# Patient Record
Sex: Male | Born: 1945
Health system: Southern US, Community
[De-identification: ages and names within clinical notes are randomized; demographics above are authoritative.]

## PROBLEM LIST (undated history)

## (undated) DIAGNOSIS — M109 Gout, unspecified: Secondary | ICD-10-CM

## (undated) DIAGNOSIS — I252 Old myocardial infarction: Secondary | ICD-10-CM

## (undated) DIAGNOSIS — Z955 Presence of coronary angioplasty implant and graft: Secondary | ICD-10-CM

## (undated) DIAGNOSIS — E785 Hyperlipidemia, unspecified: Secondary | ICD-10-CM

## (undated) DIAGNOSIS — M51369 Other intervertebral disc degeneration, lumbar region without mention of lumbar back pain or lower extremity pain: Secondary | ICD-10-CM

## (undated) DIAGNOSIS — R112 Nausea with vomiting, unspecified: Secondary | ICD-10-CM

## (undated) DIAGNOSIS — Z87442 Personal history of urinary calculi: Secondary | ICD-10-CM

## (undated) DIAGNOSIS — Z923 Personal history of irradiation: Secondary | ICD-10-CM

## (undated) DIAGNOSIS — M1990 Unspecified osteoarthritis, unspecified site: Secondary | ICD-10-CM

## (undated) DIAGNOSIS — Z9889 Other specified postprocedural states: Secondary | ICD-10-CM

## (undated) DIAGNOSIS — N393 Stress incontinence (female) (male): Secondary | ICD-10-CM

## (undated) DIAGNOSIS — N261 Atrophy of kidney (terminal): Secondary | ICD-10-CM

## (undated) DIAGNOSIS — I10 Essential (primary) hypertension: Secondary | ICD-10-CM

## (undated) DIAGNOSIS — N529 Male erectile dysfunction, unspecified: Secondary | ICD-10-CM

## (undated) DIAGNOSIS — Z77098 Contact with and (suspected) exposure to other hazardous, chiefly nonmedicinal, chemicals: Secondary | ICD-10-CM

## (undated) DIAGNOSIS — K5730 Diverticulosis of large intestine without perforation or abscess without bleeding: Secondary | ICD-10-CM

## (undated) DIAGNOSIS — N183 Chronic kidney disease, stage 3 unspecified: Secondary | ICD-10-CM

## (undated) DIAGNOSIS — L719 Rosacea, unspecified: Secondary | ICD-10-CM

## (undated) DIAGNOSIS — M5136 Other intervertebral disc degeneration, lumbar region: Secondary | ICD-10-CM

## (undated) DIAGNOSIS — N200 Calculus of kidney: Secondary | ICD-10-CM

## (undated) DIAGNOSIS — Z87440 Personal history of urinary (tract) infections: Secondary | ICD-10-CM

## (undated) DIAGNOSIS — C61 Malignant neoplasm of prostate: Secondary | ICD-10-CM

## (undated) DIAGNOSIS — N3040 Irradiation cystitis without hematuria: Secondary | ICD-10-CM

## (undated) DIAGNOSIS — Z973 Presence of spectacles and contact lenses: Secondary | ICD-10-CM

## (undated) DIAGNOSIS — R351 Nocturia: Secondary | ICD-10-CM

## (undated) DIAGNOSIS — M4800 Spinal stenosis, site unspecified: Secondary | ICD-10-CM

## (undated) DIAGNOSIS — IMO0001 Reserved for inherently not codable concepts without codable children: Secondary | ICD-10-CM

## (undated) DIAGNOSIS — Z8551 Personal history of malignant neoplasm of bladder: Secondary | ICD-10-CM

## (undated) DIAGNOSIS — I2510 Atherosclerotic heart disease of native coronary artery without angina pectoris: Secondary | ICD-10-CM

## (undated) HISTORY — PX: CARDIOVASCULAR STRESS TEST: SHX262

## (undated) HISTORY — DX: Atherosclerotic heart disease of native coronary artery without angina pectoris: I25.10

## (undated) HISTORY — PX: CORONARY ANGIOPLASTY WITH STENT PLACEMENT: SHX49

## (undated) HISTORY — DX: Gout, unspecified: M10.9

## (undated) HISTORY — DX: Personal history of irradiation: Z92.3

## (undated) HISTORY — PX: OTHER SURGICAL HISTORY: SHX169

## (undated) HISTORY — PX: CORONARY ANGIOPLASTY: SHX604

---

## 1993-11-25 HISTORY — PX: LUMBAR DISC SURGERY: SHX700

## 1998-09-20 ENCOUNTER — Ambulatory Visit (HOSPITAL_COMMUNITY): Admission: RE | Admit: 1998-09-20 | Discharge: 1998-09-20 | Payer: Self-pay | Admitting: Internal Medicine

## 1998-09-21 ENCOUNTER — Ambulatory Visit (HOSPITAL_COMMUNITY): Admission: RE | Admit: 1998-09-21 | Discharge: 1998-09-21 | Payer: Self-pay | Admitting: Internal Medicine

## 1998-09-21 ENCOUNTER — Encounter: Payer: Self-pay | Admitting: Internal Medicine

## 1998-11-02 ENCOUNTER — Observation Stay (HOSPITAL_COMMUNITY): Admission: RE | Admit: 1998-11-02 | Discharge: 1998-11-03 | Payer: Self-pay | Admitting: Neurosurgery

## 1998-11-02 ENCOUNTER — Encounter: Payer: Self-pay | Admitting: Neurosurgery

## 1998-12-20 ENCOUNTER — Encounter: Payer: Self-pay | Admitting: Neurosurgery

## 1998-12-20 ENCOUNTER — Ambulatory Visit (HOSPITAL_COMMUNITY): Admission: RE | Admit: 1998-12-20 | Discharge: 1998-12-20 | Payer: Self-pay | Admitting: Neurosurgery

## 1999-05-17 ENCOUNTER — Encounter: Payer: Self-pay | Admitting: Urology

## 1999-05-17 ENCOUNTER — Ambulatory Visit (HOSPITAL_COMMUNITY): Admission: RE | Admit: 1999-05-17 | Discharge: 1999-05-17 | Payer: Self-pay | Admitting: Urology

## 1999-11-26 HISTORY — PX: ANTERIOR CERVICAL DECOMP/DISCECTOMY FUSION: SHX1161

## 2000-07-02 ENCOUNTER — Other Ambulatory Visit: Admission: RE | Admit: 2000-07-02 | Discharge: 2000-07-02 | Payer: Self-pay | Admitting: Urology

## 2000-08-05 ENCOUNTER — Encounter: Payer: Self-pay | Admitting: Urology

## 2000-08-07 ENCOUNTER — Encounter: Payer: Self-pay | Admitting: Urology

## 2000-08-07 ENCOUNTER — Encounter (INDEPENDENT_AMBULATORY_CARE_PROVIDER_SITE_OTHER): Payer: Self-pay

## 2000-08-07 ENCOUNTER — Inpatient Hospital Stay (HOSPITAL_COMMUNITY): Admission: RE | Admit: 2000-08-07 | Discharge: 2000-08-10 | Payer: Self-pay | Admitting: Urology

## 2000-08-07 HISTORY — PX: OTHER SURGICAL HISTORY: SHX169

## 2001-11-25 HISTORY — PX: PENILE PROSTHESIS PLACEMENT: SHX739

## 2002-03-11 ENCOUNTER — Encounter: Payer: Self-pay | Admitting: Internal Medicine

## 2002-03-11 ENCOUNTER — Encounter: Admission: RE | Admit: 2002-03-11 | Discharge: 2002-03-11 | Payer: Self-pay | Admitting: Internal Medicine

## 2002-04-22 ENCOUNTER — Encounter: Admission: RE | Admit: 2002-04-22 | Discharge: 2002-04-22 | Payer: Self-pay | Admitting: Neurosurgery

## 2002-04-22 ENCOUNTER — Encounter: Payer: Self-pay | Admitting: Neurosurgery

## 2002-10-04 ENCOUNTER — Encounter: Payer: Self-pay | Admitting: Internal Medicine

## 2002-10-04 ENCOUNTER — Encounter: Admission: RE | Admit: 2002-10-04 | Discharge: 2002-10-04 | Payer: Self-pay | Admitting: Internal Medicine

## 2003-04-29 ENCOUNTER — Inpatient Hospital Stay (HOSPITAL_COMMUNITY): Admission: EM | Admit: 2003-04-29 | Discharge: 2003-05-02 | Payer: Self-pay

## 2003-05-16 ENCOUNTER — Encounter (HOSPITAL_COMMUNITY): Admission: RE | Admit: 2003-05-16 | Discharge: 2003-08-14 | Payer: Self-pay | Admitting: Cardiology

## 2003-11-02 ENCOUNTER — Encounter (INDEPENDENT_AMBULATORY_CARE_PROVIDER_SITE_OTHER): Payer: Self-pay

## 2003-11-02 ENCOUNTER — Ambulatory Visit (HOSPITAL_COMMUNITY): Admission: RE | Admit: 2003-11-02 | Discharge: 2003-11-02 | Payer: Self-pay | Admitting: General Surgery

## 2003-11-03 HISTORY — PX: INGUINAL HERNIA REPAIR: SUR1180

## 2004-07-17 ENCOUNTER — Emergency Department (HOSPITAL_COMMUNITY): Admission: EM | Admit: 2004-07-17 | Discharge: 2004-07-18 | Payer: Self-pay | Admitting: Emergency Medicine

## 2004-07-19 ENCOUNTER — Observation Stay (HOSPITAL_COMMUNITY): Admission: AD | Admit: 2004-07-19 | Discharge: 2004-07-20 | Payer: Self-pay | Admitting: Urology

## 2004-07-19 HISTORY — PX: OTHER SURGICAL HISTORY: SHX169

## 2004-07-26 ENCOUNTER — Ambulatory Visit (HOSPITAL_BASED_OUTPATIENT_CLINIC_OR_DEPARTMENT_OTHER): Admission: RE | Admit: 2004-07-26 | Discharge: 2004-07-26 | Payer: Self-pay | Admitting: Urology

## 2004-07-26 HISTORY — PX: OTHER SURGICAL HISTORY: SHX169

## 2004-11-28 ENCOUNTER — Ambulatory Visit: Payer: Self-pay | Admitting: Internal Medicine

## 2005-01-30 ENCOUNTER — Ambulatory Visit: Payer: Self-pay | Admitting: Gastroenterology

## 2005-05-30 ENCOUNTER — Ambulatory Visit: Payer: Self-pay | Admitting: Cardiology

## 2005-06-20 ENCOUNTER — Ambulatory Visit: Payer: Self-pay | Admitting: Cardiology

## 2005-06-20 ENCOUNTER — Emergency Department (HOSPITAL_COMMUNITY): Admission: EM | Admit: 2005-06-20 | Discharge: 2005-06-20 | Payer: Self-pay | Admitting: Emergency Medicine

## 2005-06-24 ENCOUNTER — Ambulatory Visit: Payer: Self-pay | Admitting: Cardiology

## 2005-07-03 ENCOUNTER — Ambulatory Visit: Payer: Self-pay

## 2005-07-10 ENCOUNTER — Ambulatory Visit: Payer: Self-pay | Admitting: Internal Medicine

## 2005-08-12 ENCOUNTER — Ambulatory Visit: Payer: Self-pay | Admitting: Internal Medicine

## 2006-06-25 ENCOUNTER — Ambulatory Visit: Payer: Self-pay | Admitting: Cardiology

## 2006-08-08 ENCOUNTER — Ambulatory Visit: Payer: Self-pay | Admitting: Internal Medicine

## 2006-09-17 ENCOUNTER — Ambulatory Visit: Payer: Self-pay | Admitting: Internal Medicine

## 2006-10-08 ENCOUNTER — Ambulatory Visit: Payer: Self-pay | Admitting: Cardiology

## 2006-12-11 ENCOUNTER — Ambulatory Visit: Payer: Self-pay | Admitting: Internal Medicine

## 2007-01-01 ENCOUNTER — Ambulatory Visit: Payer: Self-pay | Admitting: Gastroenterology

## 2007-03-04 ENCOUNTER — Ambulatory Visit: Payer: Self-pay | Admitting: Gastroenterology

## 2007-03-04 HISTORY — PX: COLONOSCOPY: SHX174

## 2007-03-04 LAB — HM COLONOSCOPY

## 2007-04-24 ENCOUNTER — Ambulatory Visit: Payer: Self-pay | Admitting: Internal Medicine

## 2007-08-20 ENCOUNTER — Ambulatory Visit: Payer: Self-pay | Admitting: Cardiology

## 2007-08-26 ENCOUNTER — Ambulatory Visit: Payer: Self-pay

## 2008-01-12 ENCOUNTER — Ambulatory Visit: Payer: Self-pay | Admitting: Internal Medicine

## 2008-01-12 DIAGNOSIS — M1990 Unspecified osteoarthritis, unspecified site: Secondary | ICD-10-CM

## 2008-01-12 DIAGNOSIS — M545 Low back pain: Secondary | ICD-10-CM

## 2008-01-12 DIAGNOSIS — I2510 Atherosclerotic heart disease of native coronary artery without angina pectoris: Secondary | ICD-10-CM | POA: Insufficient documentation

## 2008-06-07 ENCOUNTER — Ambulatory Visit: Payer: Self-pay | Admitting: Internal Medicine

## 2008-06-07 DIAGNOSIS — G5600 Carpal tunnel syndrome, unspecified upper limb: Secondary | ICD-10-CM | POA: Insufficient documentation

## 2008-06-07 DIAGNOSIS — R209 Unspecified disturbances of skin sensation: Secondary | ICD-10-CM | POA: Insufficient documentation

## 2008-06-07 DIAGNOSIS — S139XXA Sprain of joints and ligaments of unspecified parts of neck, initial encounter: Secondary | ICD-10-CM

## 2008-06-08 LAB — CONVERTED CEMR LAB
BUN: 18 mg/dL (ref 6–23)
Basophils Relative: 1.4 % — ABNORMAL HIGH (ref 0.0–1.0)
Calcium: 9.7 mg/dL (ref 8.4–10.5)
Creatinine, Ser: 1.2 mg/dL (ref 0.4–1.5)
Folate: >20 ng/mL
GFR calc Af Amer: 79 mL/min
Glucose, Bld: 88 mg/dL (ref 70–99)
Lymphocytes Relative: 33.5 % (ref 12.0–46.0)
Neutro Abs: 3.5 10*3/uL (ref 1.4–7.7)
Neutrophils Relative %: 52.3 % (ref 43.0–77.0)
Sodium: 139 meq/L (ref 135–145)
WBC: 6.8 10*3/uL (ref 4.5–10.5)

## 2008-06-14 ENCOUNTER — Telehealth (INDEPENDENT_AMBULATORY_CARE_PROVIDER_SITE_OTHER): Payer: Self-pay | Admitting: *Deleted

## 2008-06-14 ENCOUNTER — Ambulatory Visit: Payer: Self-pay | Admitting: Internal Medicine

## 2008-06-14 DIAGNOSIS — E785 Hyperlipidemia, unspecified: Secondary | ICD-10-CM | POA: Insufficient documentation

## 2008-06-14 DIAGNOSIS — M109 Gout, unspecified: Secondary | ICD-10-CM

## 2008-06-14 DIAGNOSIS — K5730 Diverticulosis of large intestine without perforation or abscess without bleeding: Secondary | ICD-10-CM | POA: Insufficient documentation

## 2008-06-14 DIAGNOSIS — L03818 Cellulitis of other sites: Secondary | ICD-10-CM

## 2008-06-14 DIAGNOSIS — L02818 Cutaneous abscess of other sites: Secondary | ICD-10-CM

## 2008-06-14 HISTORY — DX: Gout, unspecified: M10.9

## 2008-06-14 LAB — CONVERTED CEMR LAB
Eosinophils Relative: 1.6 % (ref 0.0–5.0)
HCT: 42.9 % (ref 39.0–52.0)
Neutro Abs: 3.3 10*3/uL (ref 1.4–7.7)
Platelets: 124 10*3/uL — ABNORMAL LOW (ref 150–400)
RBC: 4.67 M/uL (ref 4.22–5.81)
RDW: 12.7 % (ref 11.5–14.6)

## 2008-07-29 ENCOUNTER — Ambulatory Visit: Payer: Self-pay | Admitting: Cardiology

## 2008-10-12 ENCOUNTER — Encounter: Payer: Self-pay | Admitting: Internal Medicine

## 2008-10-17 ENCOUNTER — Ambulatory Visit: Payer: Self-pay | Admitting: Internal Medicine

## 2008-10-17 DIAGNOSIS — J209 Acute bronchitis, unspecified: Secondary | ICD-10-CM

## 2009-02-08 ENCOUNTER — Encounter: Payer: Self-pay | Admitting: Physician Assistant

## 2009-02-08 ENCOUNTER — Ambulatory Visit: Payer: Self-pay | Admitting: Cardiology

## 2009-02-15 ENCOUNTER — Telehealth (INDEPENDENT_AMBULATORY_CARE_PROVIDER_SITE_OTHER): Payer: Self-pay | Admitting: *Deleted

## 2009-02-16 ENCOUNTER — Ambulatory Visit: Payer: Self-pay

## 2009-02-16 ENCOUNTER — Encounter: Payer: Self-pay | Admitting: Internal Medicine

## 2009-02-23 ENCOUNTER — Encounter: Payer: Self-pay | Admitting: Internal Medicine

## 2009-03-30 ENCOUNTER — Ambulatory Visit: Payer: Self-pay | Admitting: Cardiology

## 2009-07-05 ENCOUNTER — Ambulatory Visit: Payer: Self-pay | Admitting: Internal Medicine

## 2009-07-05 DIAGNOSIS — J019 Acute sinusitis, unspecified: Secondary | ICD-10-CM

## 2010-01-10 ENCOUNTER — Ambulatory Visit: Payer: Self-pay | Admitting: Internal Medicine

## 2010-01-10 DIAGNOSIS — Z87891 Personal history of nicotine dependence: Secondary | ICD-10-CM | POA: Insufficient documentation

## 2010-01-10 DIAGNOSIS — M79609 Pain in unspecified limb: Secondary | ICD-10-CM | POA: Insufficient documentation

## 2010-01-19 ENCOUNTER — Ambulatory Visit: Payer: Self-pay | Admitting: Internal Medicine

## 2010-01-19 ENCOUNTER — Observation Stay (HOSPITAL_COMMUNITY): Admission: EM | Admit: 2010-01-19 | Discharge: 2010-01-23 | Payer: Self-pay | Admitting: Emergency Medicine

## 2010-01-21 ENCOUNTER — Encounter (INDEPENDENT_AMBULATORY_CARE_PROVIDER_SITE_OTHER): Payer: Self-pay | Admitting: Internal Medicine

## 2010-01-21 HISTORY — PX: TRANSTHORACIC ECHOCARDIOGRAM: SHX275

## 2010-01-24 ENCOUNTER — Encounter: Payer: Self-pay | Admitting: Cardiology

## 2010-02-02 DIAGNOSIS — I10 Essential (primary) hypertension: Secondary | ICD-10-CM | POA: Insufficient documentation

## 2010-02-02 DIAGNOSIS — N189 Chronic kidney disease, unspecified: Secondary | ICD-10-CM | POA: Insufficient documentation

## 2010-02-06 ENCOUNTER — Ambulatory Visit: Payer: Self-pay | Admitting: Cardiology

## 2010-02-20 ENCOUNTER — Encounter: Payer: Self-pay | Admitting: Internal Medicine

## 2010-03-07 ENCOUNTER — Encounter: Payer: Self-pay | Admitting: Internal Medicine

## 2010-03-12 ENCOUNTER — Encounter: Payer: Self-pay | Admitting: Cardiology

## 2010-03-15 ENCOUNTER — Ambulatory Visit: Payer: Self-pay | Admitting: Internal Medicine

## 2010-03-15 DIAGNOSIS — R040 Epistaxis: Secondary | ICD-10-CM | POA: Insufficient documentation

## 2010-04-06 ENCOUNTER — Ambulatory Visit: Payer: Self-pay | Admitting: Internal Medicine

## 2010-09-19 ENCOUNTER — Encounter: Payer: Self-pay | Admitting: Cardiology

## 2010-11-25 HISTORY — PX: BONE CYST EXCISION: SHX376

## 2010-12-27 NOTE — Miscellaneous (Signed)
Summary: pravastatin 80 called into [facility] today  Clinical Lists Changes  Medications: Changed medication from CRESTOR 40 MG TABS (ROSUVASTATIN CALCIUM) 1/2 tablet by mouth daily to PRAVASTATIN SODIUM 80 MG TABS (PRAVASTATIN SODIUM) 1 tab at bedtime - Signed Rx of PRAVASTATIN SODIUM 80 MG TABS (PRAVASTATIN SODIUM) 1 tab at bedtime;  #30 x 11;  Signed;  Entered by: Danielle Rankin, CMA;  Authorized by: Gaylord Shih, MD, [facility];  Method used: Telephoned to [id number] [address], [place], [place]  [address], Ph: [phone] or [phone], Fax: [phone]    Prescriptions: PRAVASTATIN SODIUM 80 MG TABS (PRAVASTATIN SODIUM) 1 tab at bedtime  #30 x 11   Entered by:   Danielle Rankin, CMA   Authorized by:   Gaylord Shih, MD, [facility]   Signed by:   Danielle Rankin, CMA on 09/19/2010   Method used:   Telephoned to ...       [facility] (retail)       [address]       [place], [place]  [address]       Ph: [phone] or [phone]       Fax: [phone]   RxID:   [id number]

## 2010-12-27 NOTE — Assessment & Plan Note (Signed)
Summary: 2 mos f/u // #/ cd   Vital Signs:  Patient profile:   65 year old male Height:      71 inches Weight:      215.25 pounds BMI:     30.13 O2 Sat:      95 % on Room air Temp:     98.1 degrees F oral Pulse rate:   74 / minute BP sitting:   100 / 62  (left arm) Cuff size:   regular  Vitals Entered By: Lucious Groves (March 15, 2010 11:09 AM)  O2 Flow:  Room air CC: 2 mo rtn ov/followup./kb Is Patient Diabetic? No Pain Assessment Patient in pain? no        Primary Care Provider:  Tresa Garter MD  CC:  2 mo rtn ov/followup./kb.  History of Present Illness: F/u LBP F/u CAD  C/o nasal bleed last night x 30 min  Current Medications (verified): 1)  Allopurinol 300 Mg Tabs (Allopurinol) .... Take 1 Tab Every Day 2)  Crestor 40 Mg Tabs (Rosuvastatin Calcium) .... 1/2 Tablet By Mouth Daily 3)  Metoprolol Tartrate 25 Mg Tabs (Metoprolol Tartrate) .[name] Kitchen.. 1 Tab Two Times A Day 4)  Zetia 10 Mg Tabs (Ezetimibe) .[name] Kitchen.. 1 By Mouth Qd 5)  Fish Oil 1000 Mg Caps (Omega-3 Fatty Acids) .[name] Kitchen.. 1 Cap Once Daily 6)  Vitamin D3 1000 Unit  Tabs (Cholecalciferol) .[name] Kitchen.. 1 Qd 7)  Aspirin Ec 325 Mg Tbec (Aspirin) .... Take One Tablet By Mouth Daily 8)  Centrum  Tabs (Multiple Vitamins-Minerals) .[name] Kitchen.. 1 By Mouth Once Daily 9)  Aceon 8 Mg Tabs (Perindopril Erbumine) .[name] Kitchen.. 1 By Mouth Once Daily 10)  Aleve 220 Mg Tabs (Naproxen Sodium) .... As Needed 11)  Hydrocodone-Acetaminophen 5-325 Mg Tabs (Hydrocodone-Acetaminophen) .[name] Kitchen.. 1-2 By Mouth Two Times A Day As Needed Pain 12)  Plavix 75 Mg Tabs (Clopidogrel Bisulfate) .[name] Kitchen.. 1 Tab Once Daily 13)  Nitrostat 0.4 Mg Subl (Nitroglycerin) .[name] Kitchen.. 1 Tablet Under Tongue At Onset of Chest Pain; You May Repeat Every 5 Minutes For Up To 3 Doses. 14)  Fish Oil 1000 Mg Caps (Omega-3 Fatty Acids) .[name] Kitchen.. 1 Cap Once Daily  Allergies (verified): No Known Drug Allergies  Past History:  Past Medical History: Last updated: 02/02/2010 OLD MYOCARDIAL INFARCTION/ 04/2003  (ICD-412) CORONARY ARTERY DISEASE (ICD-414.00) HYPERTENSION (ICD-401.9) HYPERLIPIDEMIA (ICD-272.4) RENAL INSUFFICIENCY, CHRONIC (ICD-585.9) LEG PAIN (ICD-729.5) TOBACCO USE, QUIT (ICD-V15.82) BRONCHITIS, ACUTE (ICD-466.0) DIVERTICULOSIS, COLON (ICD-562.10) GOUT (ICD-274.9) CELLULITIS AND ABSCESS OF OTHER SPECIFIED SITE [phone]) CARPAL TUNNEL SYNDROME (ICD-354.0) PARESTHESIA (ICD-782.0) CERVICAL STRAIN (ICD-847.0) OSTEOARTHRITIS (ICD-715.90) LOW BACK PAIN (ICD-724.2) PROSTATE CANCER, HX OF (ICD-V10.46) SINUSITIS- ACUTE-NOS (ICD-461.9)    Social History: Last updated: 03/25/2009 Married Former Smoker Full Time  Review of Systems  The patient denies fever, chest pain, and hematochezia.    Physical Exam  General:  alert and overweight-appearing.  Eyes:  vision grossly intact, pupils equal, and pupils round.   Ears:  WNL Mouth:  no gingival abnormalities and pharynx pink and moist.   Lungs:  normal respiratory effort and normal breath sounds.   Heart:  normal rate and regular rhythm.   Abdomen:  Bowel sounds positive,abdomen soft and non-tender without masses, organomegaly or hernias noted. Msk:  Lumbar-sacral spine is not tender to palpation over paraspinal muscles and not painfull with the ROM, less  stiff Neurologic:  str leg elev (-) B Skin:  Intact without suspicious lesions or rashes   Impression & Recommendations:  Problem # 1:  LEG PAIN (ICD-729.5) Assessment  Improved Needs to wear athletic type shoes at work  Problem # 2:  EPISTAXIS (ICD-784.7) Assessment: New Given QR kit  Problem # 3:  HYPERLIPIDEMIA (ICD-272.4) Assessment: Unchanged  His updated medication list for this problem includes:    Crestor 40 Mg Tabs (Rosuvastatin calcium) .[name] Kitchen... 1/2 tablet by mouth daily    Zetia 10 Mg Tabs (Ezetimibe) .[name] Kitchen... 1 by mouth qd  Problem # 4:  HYPERTENSION (ICD-401.9) Assessment: Unchanged  His updated medication list for this problem includes:     Metoprolol Tartrate 25 Mg Tabs (Metoprolol tartrate) .[name] Kitchen... 1 tab two times a day    Aceon 8 Mg Tabs (Perindopril erbumine) .[name] Kitchen... 1 by mouth once daily  Problem # 5:  CORONARY ARTERY DISEASE (ICD-414.00) Assessment: Comment Only Recent events discussed w/pt His updated medication list for this problem includes:    Metoprolol Tartrate 25 Mg Tabs (Metoprolol tartrate) .[name] Kitchen... 1 tab two times a day    Aspirin Ec 325 Mg Tbec (Aspirin) .[name] Kitchen... Take one tablet by mouth daily    Aceon 8 Mg Tabs (Perindopril erbumine) .[name] Kitchen... 1 by mouth once daily    Plavix 75 Mg Tabs (Clopidogrel bisulfate) .[name] Kitchen... 1 tab once daily    Nitrostat 0.4 Mg Subl (Nitroglycerin) .[name] Kitchen... 1 tablet under tongue at onset of chest pain; you may repeat every 5 minutes for up to 3 doses.  Complete Medication List: 1)  Allopurinol 300 Mg Tabs (Allopurinol) .... Take 1 tab every day 2)  Crestor 40 Mg Tabs (Rosuvastatin calcium) .... 1/2 tablet by mouth daily 3)  Metoprolol Tartrate 25 Mg Tabs (Metoprolol tartrate) .[name] Kitchen.. 1 tab two times a day 4)  Zetia 10 Mg Tabs (Ezetimibe) .[name] Kitchen.. 1 by mouth qd 5)  Fish Oil 1000 Mg Caps (Omega-3 fatty acids) .[name] Kitchen.. 1 cap once daily 6)  Vitamin D3 1000 Unit Tabs (Cholecalciferol) .[name] Kitchen.. 1 qd 7)  Aspirin Ec 325 Mg Tbec (Aspirin) .... Take one tablet by mouth daily 8)  Centrum Tabs (Multiple vitamins-minerals) .[name] Kitchen.. 1 by mouth once daily 9)  Aceon 8 Mg Tabs (Perindopril erbumine) .[name] Kitchen.. 1 by mouth once daily 10)  Aleve 220 Mg Tabs (Naproxen sodium) .... As needed 11)  Hydrocodone-acetaminophen 5-325 Mg Tabs (Hydrocodone-acetaminophen) .[name] Kitchen.. 1-2 by mouth two times a day as needed pain 12)  Plavix 75 Mg Tabs (Clopidogrel bisulfate) .[name] Kitchen.. 1 tab once daily 13)  Nitrostat 0.4 Mg Subl (Nitroglycerin) .[name] Kitchen.. 1 tablet under tongue at onset of chest pain; you may repeat every 5 minutes for up to 3 doses. 14)  Fish Oil 1000 Mg Caps (Omega-3 fatty acids) .[name] Kitchen.. 1 cap once daily 15)  Nosebleedqr Powd (Hydrophilic polymer) .... Use as  needed nose bleed as dirrected  Patient Instructions: 1)  Please schedule a follow-up appointment in 6 months. Prescriptions: NOSEBLEEDQR  POWD (HYDROPHILIC POLYMER) use as needed nose bleed as dirrected  #4 x 4   Entered and Authorized by:   Tresa Garter MD   Signed by:   Tresa Garter MD on 03/15/2010   Method used:   Print then Give to Patient   RxID:   [id number]

## 2010-12-27 NOTE — Miscellaneous (Signed)
Summary: [facility]  [facility]   Imported By: Roderic Ovens 02/05/2010 11:03:06  _____________________________________________________________________  External Attachment:    Type:   Image     Comment:   External Document

## 2010-12-27 NOTE — Letter (Signed)
Summary: Generic Letter  [facility]  [address] [place], [place] [address]   Phone: [phone]  Fax: [phone]    04/21/2011[name] [address] [place], [place]  [address]  Please allow [name] to wear athletic shoes at work due to his medical condition.           Sincerely,   Jacinta Shoe MD

## 2010-12-27 NOTE — Assessment & Plan Note (Signed)
Summary: sinus problem--aches---stc   Vital Signs:  Patient profile:   65 year old male Height:      71 inches Weight:      215.50 pounds BMI:     30.16 O2 Sat:      96 % on Room air Temp:     98.1 degrees F oral Pulse rate:   78 / minute BP sitting:   110 / 68  (left arm) Cuff size:   large  Vitals Entered By: Lucious Groves (Apr 06, 2010 3:48 PM)  O2 Flow:  Room air CC: C/O sinus issues x1 week. Notes increased drainage, pressure, and HA./kb Is Patient Diabetic? No Pain Assessment Patient in pain? no      Comments Patient denies cough/producing mucous./kb   Primary Care Provider:  Tresa Garter MD  CC:  C/O sinus issues x1 week. Notes increased drainage, pressure, and and HA./kb.  History of Present Illness: The patient presents with complaints of sore sinuses L>R, fever, cough, sinus congestion and drainge of several days duration. Not better with OTC meds.   The mucus is colored. Teeth hurt. Skin cracks on hands   Current Medications (verified): 1)  Allopurinol 300 Mg Tabs (Allopurinol) .... Take 1 Tab Every Day 2)  Crestor 40 Mg Tabs (Rosuvastatin Calcium) .... 1/2 Tablet By Mouth Daily 3)  Metoprolol Tartrate 25 Mg Tabs (Metoprolol Tartrate) .[name] Kitchen.. 1 Tab Two Times A Day 4)  Zetia 10 Mg Tabs (Ezetimibe) .[name] Kitchen.. 1 By Mouth Qd 5)  Fish Oil 1000 Mg Caps (Omega-3 Fatty Acids) .[name] Kitchen.. 1 Cap Once Daily 6)  Vitamin D3 1000 Unit  Tabs (Cholecalciferol) .[name] Kitchen.. 1 Qd 7)  Aspirin Ec 325 Mg Tbec (Aspirin) .... Take One Tablet By Mouth Daily 8)  Centrum  Tabs (Multiple Vitamins-Minerals) .[name] Kitchen.. 1 By Mouth Once Daily 9)  Aceon 8 Mg Tabs (Perindopril Erbumine) .[name] Kitchen.. 1 By Mouth Once Daily 10)  Aleve 220 Mg Tabs (Naproxen Sodium) .... As Needed 11)  Hydrocodone-Acetaminophen 5-325 Mg Tabs (Hydrocodone-Acetaminophen) .[name] Kitchen.. 1-2 By Mouth Two Times A Day As Needed Pain 12)  Plavix 75 Mg Tabs (Clopidogrel Bisulfate) .[name] Kitchen.. 1 Tab Once Daily 13)  Nitrostat 0.4 Mg Subl (Nitroglycerin) .[name] Kitchen.. 1 Tablet  Under Tongue At Onset of Chest Pain; You May Repeat Every 5 Minutes For Up To 3 Doses.  Allergies (verified): No Known Drug Allergies  Past History:  Past Medical History: Last updated: 02/02/2010 OLD MYOCARDIAL INFARCTION/ 04/2003 (ICD-412) CORONARY ARTERY DISEASE (ICD-414.00) HYPERTENSION (ICD-401.9) HYPERLIPIDEMIA (ICD-272.4) RENAL INSUFFICIENCY, CHRONIC (ICD-585.9) LEG PAIN (ICD-729.5) TOBACCO USE, QUIT (ICD-V15.82) BRONCHITIS, ACUTE (ICD-466.0) DIVERTICULOSIS, COLON (ICD-562.10) GOUT (ICD-274.9) CELLULITIS AND ABSCESS OF OTHER SPECIFIED SITE [phone]) CARPAL TUNNEL SYNDROME (ICD-354.0) PARESTHESIA (ICD-782.0) CERVICAL STRAIN (ICD-847.0) OSTEOARTHRITIS (ICD-715.90) LOW BACK PAIN (ICD-724.2) PROSTATE CANCER, HX OF (ICD-V10.46) SINUSITIS- ACUTE-NOS (ICD-461.9)    Social History: Last updated: 03/25/2009 Married Former Smoker Full Time  Physical Exam  General:  alert and overweight-appearing.  Nose:  pus in L nostril Mouth:  Erythematous throat mucosa and intranasal erythema.  Lungs:  normal respiratory effort and normal breath sounds.   Heart:  normal rate and regular rhythm.   Abdomen:  Bowel sounds positive,abdomen soft and non-tender without masses, organomegaly or hernias noted.   Impression & Recommendations:  Problem # 1:  SINUSITIS- ACUTE-NOS (ICD-461.9) Assessment New  His updated medication list for this problem includes:    Augmentin 875-125 Mg Tabs (Amoxicillin-pot clavulanate) .[name] Kitchen... 1 by mouth bid  Complete Medication List: 1)  Allopurinol 300 Mg Tabs (Allopurinol) .... Take 1  tab every day 2)  Crestor 40 Mg Tabs (Rosuvastatin calcium) .... 1/2 tablet by mouth daily 3)  Metoprolol Tartrate 25 Mg Tabs (Metoprolol tartrate) .[name] Kitchen.. 1 tab two times a day 4)  Zetia 10 Mg Tabs (Ezetimibe) .[name] Kitchen.. 1 by mouth qd 5)  Fish Oil 1000 Mg Caps (Omega-3 fatty acids) .[name] Kitchen.. 1 cap once daily 6)  Vitamin D3 1000 Unit Tabs (Cholecalciferol) .[name] Kitchen.. 1 qd 7)  Aspirin Ec  325 Mg Tbec (Aspirin) .... Take one tablet by mouth daily 8)  Centrum Tabs (Multiple vitamins-minerals) .[name] Kitchen.. 1 by mouth once daily 9)  Aceon 8 Mg Tabs (Perindopril erbumine) .[name] Kitchen.. 1 by mouth once daily 10)  Aleve 220 Mg Tabs (Naproxen sodium) .... As needed 11)  Hydrocodone-acetaminophen 5-325 Mg Tabs (Hydrocodone-acetaminophen) .[name] Kitchen.. 1-2 by mouth two times a day as needed pain 12)  Plavix 75 Mg Tabs (Clopidogrel bisulfate) .[name] Kitchen.. 1 tab once daily 13)  Nitrostat 0.4 Mg Subl (Nitroglycerin) .[name] Kitchen.. 1 tablet under tongue at onset of chest pain; you may repeat every 5 minutes for up to 3 doses. 14)  Triamcinolone Acetonide 0.5 % Oint (Triamcinolone acetonide) .... Use two times a day as needed on rash 15)  Augmentin 875-125 Mg Tabs (Amoxicillin-pot clavulanate) .[name] Kitchen.. 1 by mouth bid  Patient Instructions: 1)  Use over-the-counter medicines for "cold": Tylenol  650mg  or Advil 400mg  every 6 hours  for fever; Delsym or Robutussin for cough. Mucinex or Mucinex D for congestion. Ricola or Halls for sore throat. Office visit if not better or if worse. Prescriptions: TRIAMCINOLONE ACETONIDE 0.5 % OINT (TRIAMCINOLONE ACETONIDE) use two times a day as needed on rash  #120 g x 3   Entered and Authorized by:   Tresa Garter MD   Signed by:   Tresa Garter MD on 04/06/2010   Method used:   Print then Give to Patient   RxID:   [id number] AUGMENTIN 875-125 MG TABS (AMOXICILLIN-POT CLAVULANATE) 1 by mouth bid  #20 x 1   Entered and Authorized by:   Tresa Garter MD   Signed by:   Tresa Garter MD on 04/06/2010   Method used:   Print then Give to Patient   RxID:   [id number] AUGMENTIN 875-125 MG TABS (AMOXICILLIN-POT CLAVULANATE) 1 by mouth bid  #20 x 1   Entered and Authorized by:   Tresa Garter MD   Signed by:   Tresa Garter MD on 04/06/2010   Method used:   Electronically to        [facility] (retail)       [address]       [place], [place]   [address]       Ph: [phone] or [phone]       Fax: [phone]   RxID:   [id number] TRIAMCINOLONE ACETONIDE 0.5 % OINT (TRIAMCINOLONE ACETONIDE) use two times a day as needed on rash  #120 g x 3   Entered and Authorized by:   Tresa Garter MD   Signed by:   Tresa Garter MD on 04/06/2010   Method used:   Electronically to        [facility] (retail)       [address]       [place], [place]  [address]       Ph: [phone] or [phone]       Fax: [phone]   RxID:   [id number]

## 2010-12-27 NOTE — Letter (Signed)
Summary: [employer] - Appeal Form  [employer] - Appeal Form   Imported By: Marylou Mccoy 08/20/2010 12:38:31  _____________________________________________________________________  External Attachment:    Type:   Image     Comment:   External Document

## 2010-12-27 NOTE — Assessment & Plan Note (Signed)
Summary: eph/ gd   Visit Type:  EPH Primary Provider:  Tresa Garter MD  CC:  no cardiac complaints ..pt states he feels great.  History of Present Illness: Mr. Marti returns today for evaluation management of his coronary disease.  He was readmitted February 25 with unstable angina. Had high-grade in-stent restenosis in his obtuse marginal branch. If a 65% an echo showed mild left ventricular hypertrophy.  He had a successful cutting balloon angioplasty on February 28. He ruled out for myocardial infarction.  Since discharge had no recurrent symptoms. He is very compliant with his medications. Lipids were drawn in the hospital his total cholesterol was 106, triglycerides of 62, HDL 57, LDL 37. His hemoglobin A1c was 5.6  Current Medications (verified): 1)  Allopurinol 300 Mg Tabs (Allopurinol) .... Take 1 Tab Every Day 2)  Crestor 40 Mg Tabs (Rosuvastatin Calcium) .... 1/2 Tablet By Mouth Daily 3)  Metoprolol Tartrate 25 Mg Tabs (Metoprolol Tartrate) .[name] Kitchen.. 1 Tab Two Times A Day 4)  Zetia 10 Mg Tabs (Ezetimibe) .[name] Kitchen.. 1 By Mouth Qd 5)  Fish Oil 1000 Mg Caps (Omega-3 Fatty Acids) .[name] Kitchen.. 1 Cap Once Daily 6)  Vitamin D3 1000 Unit  Tabs (Cholecalciferol) .[name] Kitchen.. 1 Qd 7)  Aspirin Ec 325 Mg Tbec (Aspirin) .... Take One Tablet By Mouth Daily 8)  Centrum  Tabs (Multiple Vitamins-Minerals) .[name] Kitchen.. 1 By Mouth Once Daily 9)  Aceon 8 Mg Tabs (Perindopril Erbumine) .[name] Kitchen.. 1 By Mouth Once Daily 10)  Aleve 220 Mg Tabs (Naproxen Sodium) .... As Needed 11)  Hydrocodone-Acetaminophen 5-325 Mg Tabs (Hydrocodone-Acetaminophen) .[name] Kitchen.. 1-2 By Mouth Two Times A Day As Needed Pain 12)  Plavix 75 Mg Tabs (Clopidogrel Bisulfate) .[name] Kitchen.. 1 Tab Once Daily 13)  Nitrostat 0.4 Mg Subl (Nitroglycerin) .[name] Kitchen.. 1 Tablet Under Tongue At Onset of Chest Pain; You May Repeat Every 5 Minutes For Up To 3 Doses. 14)  Fish Oil 1000 Mg Caps (Omega-3 Fatty Acids) .[name] Kitchen.. 1 Cap Once Daily  Allergies (verified): No Known Drug  Allergies  Review of Systems       negative other than history of present illness  Vital Signs:  Patient profile:   65 year old male Height:      71 inches Weight:      216 pounds BMI:     30.23 Pulse rate:   63 / minute Pulse rhythm:   regular BP sitting:   116 / 72  (left arm) Cuff size:   large  Vitals Entered By: Danielle Rankin, CMA (February 06, 2010 11:44 AM)  Physical Exam  General:  Well developed, well nourished, in no acute distress. Head:  normocephalic and atraumatic Eyes:  PERRLA/EOM intact; conjunctiva and lids normal. Neck:  Neck supple, no JVD. No masses, thyromegaly or abnormal cervical nodes. Chest Haadi Santellan:  no deformities or breast masses noted Lungs:  Clear bilaterally to auscultation and percussion. Heart:  Non-displaced PMI, chest non-tender; regular rate and rhythm, S1, S2 without murmurs, rubs or gallops. Carotid upstroke normal, no bruit. Normal abdominal aortic size, no bruits. Femorals normal pulses, no bruits. Pedals normal pulses. No edema, no varicosities. Msk:  Back normal, normal gait. Muscle strength and tone normal. Pulses:  pulses normal in all 4 extremities Extremities:  No clubbing or cyanosis. Neurologic:  Alert and oriented x 3. Skin:  Intact without lesions or rashes. Psych:  Normal affect.   EKG  Procedure date:  02/06/2010  Findings:      normal sinus rhythm, normal EKG  Impression & Recommendations:  Problem # 1:  CORONARY ARTERY DISEASE (ICD-414.00) Assessment Improved  His updated medication list for this problem includes:    Metoprolol Tartrate 25 Mg Tabs (Metoprolol tartrate) .[name] Kitchen... 1 tab two times a day    Aspirin Ec 325 Mg Tbec (Aspirin) .[name] Kitchen... Take one tablet by mouth daily    Aceon 8 Mg Tabs (Perindopril erbumine) .[name] Kitchen... 1 by mouth once daily    Plavix 75 Mg Tabs (Clopidogrel bisulfate) .[name] Kitchen... 1 tab once daily    Nitrostat 0.4 Mg Subl (Nitroglycerin) .[name] Kitchen... 1 tablet under tongue at onset of chest pain; you may repeat every 5  minutes for up to 3 doses.  His updated medication list for this problem includes:    Metoprolol Tartrate 25 Mg Tabs (Metoprolol tartrate) .[name] Kitchen... 1 tab two times a day    Aspirin Ec 325 Mg Tbec (Aspirin) .[name] Kitchen... Take one tablet by mouth daily    Aceon 8 Mg Tabs (Perindopril erbumine) .[name] Kitchen... 1 by mouth once daily    Plavix 75 Mg Tabs (Clopidogrel bisulfate) .[name] Kitchen... 1 tab once daily    Nitrostat 0.4 Mg Subl (Nitroglycerin) .[name] Kitchen... 1 tablet under tongue at onset of chest pain; you may repeat every 5 minutes for up to 3 doses.  Orders: EKG w/ Interpretation ([address])  Problem # 2:  CORONARY ARTERY DISEASE (ICD-414.00) Assessment: Unchanged  His updated medication list for this problem includes:    Metoprolol Tartrate 25 Mg Tabs (Metoprolol tartrate) .[name] Kitchen... 1 tab two times a day    Aspirin Ec 325 Mg Tbec (Aspirin) .[name] Kitchen... Take one tablet by mouth daily    Aceon 8 Mg Tabs (Perindopril erbumine) .[name] Kitchen... 1 by mouth once daily    Plavix 75 Mg Tabs (Clopidogrel bisulfate) .[name] Kitchen... 1 tab once daily    Nitrostat 0.4 Mg Subl (Nitroglycerin) .[name] Kitchen... 1 tablet under tongue at onset of chest pain; you may repeat every 5 minutes for up to 3 doses.  His updated medication list for this problem includes:    Metoprolol Tartrate 25 Mg Tabs (Metoprolol tartrate) .[name] Kitchen... 1 tab two times a day    Aspirin Ec 325 Mg Tbec (Aspirin) .[name] Kitchen... Take one tablet by mouth daily    Aceon 8 Mg Tabs (Perindopril erbumine) .[name] Kitchen... 1 by mouth once daily    Plavix 75 Mg Tabs (Clopidogrel bisulfate) .[name] Kitchen... 1 tab once daily    Nitrostat 0.4 Mg Subl (Nitroglycerin) .[name] Kitchen... 1 tablet under tongue at onset of chest pain; you may repeat every 5 minutes for up to 3 doses.  Orders: EKG w/ Interpretation ([address])  Problem # 3:  HYPERTENSION (ICD-401.9)  His updated medication list for this problem includes:    Metoprolol Tartrate 25 Mg Tabs (Metoprolol tartrate) .[name] Kitchen... 1 tab two times a day    Aspirin Ec 325 Mg Tbec (Aspirin) .[name] Kitchen... Take one tablet by mouth  daily    Aceon 8 Mg Tabs (Perindopril erbumine) .[name] Kitchen... 1 by mouth once daily  His updated medication list for this problem includes:    Metoprolol Tartrate 25 Mg Tabs (Metoprolol tartrate) .[name] Kitchen... 1 tab two times a day    Aspirin Ec 325 Mg Tbec (Aspirin) .[name] Kitchen... Take one tablet by mouth daily    Aceon 8 Mg Tabs (Perindopril erbumine) .[name] Kitchen... 1 by mouth once daily  Problem # 4:  HYPERLIPIDEMIA (ICD-272.4)  His updated medication list for this problem includes:    Crestor 40 Mg Tabs (Rosuvastatin calcium) .[name] Kitchen... 1/2 tablet by mouth daily    Zetia 10 Mg Tabs (Ezetimibe) .[name] Kitchen... 1 by mouth qd  His updated medication list for this problem includes:    Crestor 40 Mg Tabs (Rosuvastatin calcium) .[name] Kitchen... 1/2 tablet by mouth daily    Zetia 10 Mg Tabs (Ezetimibe) .[name] Kitchen... 1 by mouth qd  Patient Instructions: 1)  Your physician recommends that you schedule a follow-up appointment in: 5 MONTHS WITH DR Marty Sadlowski 2)  Your physician recommends that you continue on your current medications as directed. Please refer to the Current Medication list given to you today. 3)  UNSTABLE ANGINA  411.1

## 2010-12-27 NOTE — Letter (Signed)
Summary: [facility]   Imported By: Sherian Rein 04/12/2010 10:01:56  _____________________________________________________________________  External Attachment:    Type:   Image     Comment:   External Document

## 2010-12-27 NOTE — Assessment & Plan Note (Signed)
Summary: low back pain/#/cd   Vital Signs:  Patient profile:   65 year old male Weight:      219 pounds Temp:     98.1 degrees F oral Pulse rate:   56 / minute BP sitting:   100 / 76  (left arm)  Vitals Entered By: Tora Perches (January 10, 2010 4:55 PM) CC: lower Is Patient Diabetic? No   Primary Care Provider:  Tresa Garter MD  CC:  lower.  History of Present Illness: C/o LBP irrad down R foot 6-7/10 comes and goes 1.5 months   Preventive Screening-Counseling & Management  Alcohol-Tobacco     Smoking Status: quit  Current Medications (verified): 1)  Allopurinol 300 Mg Tabs (Allopurinol) .... Take 1 Tab Every Day 2)  Crestor 40 Mg Tabs (Rosuvastatin Calcium) .... 1/2 Tablet By Mouth Daily 3)  Metoprolol Tartrate 50 Mg Tabs (Metoprolol Tartrate) .... Take 1 Tab By Mouth Daily 4)  Zetia 10 Mg Tabs (Ezetimibe) .[name] Kitchen.. 1 By Mouth Qd 5)  Fish Oil   Oil (Fish Oil) .[name] Kitchen.. 1 By Mouth Bid 6)  Vitamin D3 1000 Unit  Tabs (Cholecalciferol) .[name] Kitchen.. 1 Qd 7)  Aspirin 81 Mg  Tbec (Aspirin) .... One By Mouth Every Day 8)  Centrum  Tabs (Multiple Vitamins-Minerals) .[name] Kitchen.. 1 By Mouth Once Daily 9)  Aceon 8 Mg Tabs (Perindopril Erbumine) .[name] Kitchen.. 1 By Mouth Once Daily 10)  Aleve 220 Mg Tabs (Naproxen Sodium) .... As Needed  Allergies (verified): No Known Drug Allergies  Physical Exam  General:  alert and overweight-appearing.  Mouth:  no gingival abnormalities and pharynx pink and moist.   Lungs:  normal respiratory effort and normal breath sounds.   Heart:  normal rate and regular rhythm.   Msk:  Lumbar-sacral spine is tender to palpation over paraspinal muscles and painfull with the ROM  stiff Neurologic:  str leg elev (-) B Skin:  Intact without suspicious lesions or rashes   Impression & Recommendations:  Problem # 1:  LOW BACK PAIN (ICD-724.2) Assessment New  The following medications were removed from the medication list:    Vicodin 5-500 Mg Tabs (Hydrocodone-acetaminophen)  .[name] Kitchen... 1 by mouth qid prn    Meloxicam 15 Mg Tabs (Meloxicam) ..... One by mouth daily x 2 wks, then prn His updated medication list for this problem includes:    Aspirin 81 Mg Tbec (Aspirin) ..... One by mouth every day    Aleve 220 Mg Tabs (Naproxen sodium) .[name] Kitchen... As needed    Hydrocodone-acetaminophen 5-325 Mg Tabs (Hydrocodone-acetaminophen) .[name] Kitchen... 1-2 by mouth two times a day as needed pain  Orders: T-Lumbar Spine 2 Views (72100TC)  Problem # 2:  LEG PAIN (ICD-729.5) R - shiatica Assessment: New Take 40mg  qd for 3 days, then 20 mg qd for 3 days, then 10mg  qd for 6 days, then stop. Take pc.   Problem # 3:  PROSTATE CANCER, HX OF (ICD-V10.46) Assessment: Comment Only x ray  Complete Medication List: 1)  Allopurinol 300 Mg Tabs (Allopurinol) .... Take 1 tab every day 2)  Crestor 40 Mg Tabs (Rosuvastatin calcium) .... 1/2 tablet by mouth daily 3)  Metoprolol Tartrate 50 Mg Tabs (Metoprolol tartrate) .... Take 1 tab by mouth daily 4)  Zetia 10 Mg Tabs (Ezetimibe) .[name] Kitchen.. 1 by mouth qd 5)  Fish Oil Oil (Fish oil) .[name] Kitchen.. 1 by mouth bid 6)  Vitamin D3 1000 Unit Tabs (Cholecalciferol) .[name] Kitchen.. 1 qd 7)  Aspirin 81 Mg Tbec (Aspirin) .... One by mouth every day 8)  Centrum Tabs (Multiple vitamins-minerals) .[name] Kitchen.. 1 by mouth once daily 9)  Aceon 8 Mg Tabs (Perindopril erbumine) .[name] Kitchen.. 1 by mouth once daily 10)  Aleve 220 Mg Tabs (Naproxen sodium) .... As needed 11)  Prednisone 10 Mg Tabs (Prednisone) .... Take 40mg  qd for 3 days, then 20 mg qd for 3 days, then 10mg  qd for 6 days, then stop. take pc. 12)  Hydrocodone-acetaminophen 5-325 Mg Tabs (Hydrocodone-acetaminophen) .[name] Kitchen.. 1-2 by mouth two times a day as needed pain  Patient Instructions: 1)  Please schedule a follow-up appointment in 2 months. 2)  Call if you are not better in a reasonable amount of time or if worse.  3)  Use stretching exercises that I have provided (15 min. or longer every day) or yoga Prescriptions: HYDROCODONE-ACETAMINOPHEN  5-325 MG TABS (HYDROCODONE-ACETAMINOPHEN) 1-2 by mouth two times a day as needed pain  #90 x 1   Entered and Authorized by:   Tresa Garter MD   Signed by:   Tresa Garter MD on 01/10/2010   Method used:   Print then Give to Patient   RxID:   [id number] PREDNISONE 10 MG TABS (PREDNISONE) Take 40mg  qd for 3 days, then 20 mg qd for 3 days, then 10mg  qd for 6 days, then stop. Take pc.  #24 x 1   Entered and Authorized by:   Tresa Garter MD   Signed by:   Tresa Garter MD on 01/10/2010   Method used:   Print then Give to Patient   RxID:   [id number]

## 2011-02-13 LAB — CK TOTAL AND CKMB (NOT AT ARMC)
Relative Index: 1.5 (ref 0.0–2.5)
Total CK: 317 U/L — ABNORMAL HIGH (ref 7–232)

## 2011-02-13 LAB — CBC
HCT: 45 % (ref 39.0–52.0)
Hemoglobin: 15.5 g/dL (ref 13.0–17.0)
MCHC: 33.9 g/dL (ref 30.0–36.0)
MCHC: 34 g/dL (ref 30.0–36.0)
MCHC: 34 g/dL (ref 30.0–36.0)
MCHC: 34.1 g/dL (ref 30.0–36.0)
MCV: 93.7 fL (ref 78.0–100.0)
MCV: 94.1 fL (ref 78.0–100.0)
Platelets: 110 10*3/uL — ABNORMAL LOW (ref 150–400)
Platelets: 124 10*3/uL — ABNORMAL LOW (ref 150–400)
Platelets: 125 10*3/uL — ABNORMAL LOW (ref 150–400)
RBC: 4.89 MIL/uL (ref 4.22–5.81)
RDW: 13 % (ref 11.5–15.5)
RDW: 13.2 % (ref 11.5–15.5)
RDW: 13.4 % (ref 11.5–15.5)
WBC: 6.7 10*3/uL (ref 4.0–10.5)

## 2011-02-13 LAB — HEPARIN LEVEL (UNFRACTIONATED)
Heparin Unfractionated: 0.53 IU/mL (ref 0.30–0.70)
Heparin Unfractionated: 0.57 IU/mL (ref 0.30–0.70)
Heparin Unfractionated: 0.59 IU/mL (ref 0.30–0.70)
Heparin Unfractionated: 0.9 IU/mL — ABNORMAL HIGH (ref 0.30–0.70)

## 2011-02-13 LAB — DIFFERENTIAL
Basophils Absolute: 0.1 10*3/uL (ref 0.0–0.1)
Basophils Absolute: 0.1 10*3/uL (ref 0.0–0.1)
Basophils Relative: 1 % (ref 0–1)
Eosinophils Absolute: 0.3 10*3/uL (ref 0.0–0.7)
Eosinophils Absolute: 0.3 10*3/uL (ref 0.0–0.7)
Eosinophils Relative: 4 % (ref 0–5)
Lymphocytes Relative: 33 % (ref 12–46)
Monocytes Relative: 9 % (ref 3–12)
Neutro Abs: 3.4 10*3/uL (ref 1.7–7.7)
Neutrophils Relative %: 53 % (ref 43–77)
Neutrophils Relative %: 58 % (ref 43–77)

## 2011-02-13 LAB — URINALYSIS, ROUTINE W REFLEX MICROSCOPIC
Glucose, UA: NEGATIVE mg/dL
Specific Gravity, Urine: 1.021 (ref 1.005–1.030)

## 2011-02-13 LAB — HEMOGLOBIN A1C: Hgb A1c MFr Bld: 5.6 % (ref 4.6–6.1)

## 2011-02-13 LAB — HEPATIC FUNCTION PANEL
Albumin: 3.8 g/dL (ref 3.5–5.2)
Total Bilirubin: 1 mg/dL (ref 0.3–1.2)

## 2011-02-13 LAB — MAGNESIUM: Magnesium: 1.9 mg/dL (ref 1.5–2.5)

## 2011-02-13 LAB — RAPID URINE DRUG SCREEN, HOSP PERFORMED
Amphetamines: NOT DETECTED
Benzodiazepines: NOT DETECTED

## 2011-02-13 LAB — LIPID PANEL
Cholesterol: 106 mg/dL (ref 0–200)
HDL: 57 mg/dL (ref >39)
LDL Cholesterol: 37 mg/dL (ref 0–99)
Triglycerides: 62 mg/dL (ref <150)

## 2011-02-13 LAB — BASIC METABOLIC PANEL
BUN: 14 mg/dL (ref 6–23)
BUN: 14 mg/dL (ref 6–23)
BUN: 16 mg/dL (ref 6–23)
BUN: 17 mg/dL (ref 6–23)
CO2: 28 mEq/L (ref 19–32)
CO2: 28 mEq/L (ref 19–32)
CO2: 30 mEq/L (ref 19–32)
Calcium: 9.1 mg/dL (ref 8.4–10.5)
Calcium: 9.4 mg/dL (ref 8.4–10.5)
Chloride: 104 mEq/L (ref 96–112)
Chloride: 104 mEq/L (ref 96–112)
Creatinine, Ser: 1.13 mg/dL (ref 0.4–1.5)
Creatinine, Ser: 1.18 mg/dL (ref 0.4–1.5)
Creatinine, Ser: 1.18 mg/dL (ref 0.4–1.5)
Creatinine, Ser: 1.26 mg/dL (ref 0.4–1.5)
GFR calc Af Amer: >60 mL/min (ref >60)
Glucose, Bld: 105 mg/dL — ABNORMAL HIGH (ref 70–99)
Glucose, Bld: 118 mg/dL — ABNORMAL HIGH (ref 70–99)
Glucose, Bld: 98 mg/dL (ref 70–99)
Potassium: 3.9 mEq/L (ref 3.5–5.1)
Sodium: 143 mEq/L (ref 135–145)

## 2011-02-13 LAB — URINE MICROSCOPIC-ADD ON

## 2011-02-13 LAB — POCT CARDIAC MARKERS
Myoglobin, poc: 111 ng/mL (ref 12–200)
Troponin i, poc: <0.05 ng/mL (ref 0.00–0.09)

## 2011-02-13 LAB — CARDIAC PANEL(CRET KIN+CKTOT+MB+TROPI)
Relative Index: 1.4 (ref 0.0–2.5)
Total CK: 211 U/L (ref 7–232)
Troponin I: 0.02 ng/mL (ref 0.00–0.06)
Troponin I: 0.02 ng/mL (ref 0.00–0.06)

## 2011-02-15 LAB — BASIC METABOLIC PANEL
CO2: 25 mEq/L (ref 19–32)
Calcium: 9 mg/dL (ref 8.4–10.5)
Creatinine, Ser: 1.15 mg/dL (ref 0.4–1.5)
GFR calc Af Amer: >60 mL/min (ref >60)
GFR calc non Af Amer: >60 mL/min (ref >60)
Sodium: 132 mEq/L — ABNORMAL LOW (ref 135–145)

## 2011-02-15 LAB — CBC
Hemoglobin: 15.6 g/dL (ref 13.0–17.0)
RBC: 4.85 MIL/uL (ref 4.22–5.81)
WBC: 6.8 10*3/uL (ref 4.0–10.5)

## 2011-03-14 ENCOUNTER — Encounter: Payer: Self-pay | Admitting: Internal Medicine

## 2011-03-14 ENCOUNTER — Ambulatory Visit (INDEPENDENT_AMBULATORY_CARE_PROVIDER_SITE_OTHER): Payer: BC Managed Care – PPO | Admitting: Internal Medicine

## 2011-03-14 VITALS — BP 98/68 | HR 81 | Temp 98.0°F | Ht 71.0 in | Wt 216.5 lb

## 2011-03-14 DIAGNOSIS — M519 Unspecified thoracic, thoracolumbar and lumbosacral intervertebral disc disorder: Secondary | ICD-10-CM | POA: Insufficient documentation

## 2011-03-14 DIAGNOSIS — Z9889 Other specified postprocedural states: Secondary | ICD-10-CM | POA: Insufficient documentation

## 2011-03-14 DIAGNOSIS — M79671 Pain in right foot: Secondary | ICD-10-CM

## 2011-03-14 DIAGNOSIS — Z0000 Encounter for general adult medical examination without abnormal findings: Secondary | ICD-10-CM | POA: Insufficient documentation

## 2011-03-14 DIAGNOSIS — M79609 Pain in unspecified limb: Secondary | ICD-10-CM

## 2011-03-14 DIAGNOSIS — M5416 Radiculopathy, lumbar region: Secondary | ICD-10-CM

## 2011-03-14 DIAGNOSIS — I10 Essential (primary) hypertension: Secondary | ICD-10-CM

## 2011-03-14 DIAGNOSIS — IMO0002 Reserved for concepts with insufficient information to code with codable children: Secondary | ICD-10-CM

## 2011-03-14 MED ORDER — PREDNISONE 10 MG PO TABS: 10.0000 mg | ORAL_TABLET | Freq: Every day | ORAL | Status: AC

## 2011-03-14 NOTE — Patient Instructions (Signed)
Take all new medications as prescribed Continue all other medications as before You will be contacted regarding the referral for: MRI, and neurosurgury referrals, and podiatry

## 2011-03-14 NOTE — Progress Notes (Signed)
Subjective:    Patient ID: [name], male    DOB: 10-Jul-1946, 65 y.o.   MRN: [id number]  HPI  Here with Acute onset Right LBP x 4 wks with radiation to the right calf, assoc with numbness but no weakness, worse with back excerises he though might help, especially overall worse in the am, works in the warehouse with lifting which makes worse as well, does tend to get better with sitting but takes quite a well and never really goes away;  all without change in  bowel or bladder change, fever, wt loss,  worsening LE pain/numbness/weakness, gait change or falls, except does have to limp to recent onset pain to right ball of foot , moderate pain, without trauma, swelling, redness, rash.  Pt denies chest pain, increased sob or doe, wheezing, orthopnea, PND, increased LE swelling, palpitations, dizziness or syncope.  Pt denies new neurological symptoms such as new headache, or facial or extremity weakness or numbness   Pt denies polydipsia, polyuria. Past Medical History  Diagnosis Date  . HYPERLIPIDEMIA 06/14/2008  . GOUT 06/14/2008  . Carpal tunnel syndrome 06/07/2008  . HYPERTENSION 02/02/2010  . CORONARY ARTERY DISEASE 01/12/2008  . SINUSITIS- ACUTE-NOS 07/05/2009  . BRONCHITIS, ACUTE 10/17/2008  . DIVERTICULOSIS, COLON 06/14/2008  . RENAL INSUFFICIENCY, CHRONIC 02/02/2010  . Cellulitis and abscess of other specified site 06/14/2008  . OSTEOARTHRITIS 01/12/2008  . LOW BACK PAIN 01/12/2008  . LEG PAIN 01/10/2010  . PARESTHESIA 06/07/2008  . Epistaxis 03/15/2010  . CERVICAL STRAIN 06/07/2008  . PROSTATE CANCER, HX OF 01/12/2008  . TOBACCO USE, QUIT 01/10/2010  . Lumbar disc disease 03/14/2011  . S/P lumbar discectomy 03/14/2011   Past Surgical History  Procedure Date  . Lumbar laminectomy   . Prostatectomy   . S/p cervical surgury 2001  . Right ureteroscopic stone extraction with holmium lasertripsy and right ureteral stent insertion 9.1.2005  . Cystoscopy 07/19/2004    insertion of right  double-J stent  . Repair of left inguinal hernia with mesh 11/02/2003    ([facility])    reports that he has quit smoking. He does not have any smokeless tobacco history on file. His alcohol and drug histories not on file. family history includes Hypertension in his other. No Known Allergies Current Outpatient Prescriptions on File Prior to Visit  Medication Sig Dispense Refill  . allopurinol (ZYLOPRIM) 300 MG tablet Take 300 mg by mouth daily.        [name] Kitchen aspirin 325 MG tablet Take 325 mg by mouth daily.        . Cholecalciferol (VITAMIN D3) 1000 UNITS CAPS Take by mouth daily.        . clopidogrel (PLAVIX) 75 MG tablet Take 75 mg by mouth daily.        [name] Kitchen ezetimibe (ZETIA) 10 MG tablet Take 10 mg by mouth daily.        [name] Kitchen HYDROcodone-acetaminophen (NORCO) 5-325 MG per tablet Take 1 tablet by mouth. 1-2 by mouth two times a day as needed for pain       . metoprolol tartrate (LOPRESSOR) 25 MG tablet Take 25 mg by mouth 2 (two) times daily.        . Multiple Vitamins-Minerals (CENTRUM PO) Take by mouth daily.        . naproxen sodium (ANAPROX) 220 MG tablet Take 220 mg by mouth as needed.        . nitroGLYCERIN (NITROSTAT) 0.4 MG SL tablet Place 0.4 mg under the tongue every 5 (  five) minutes as needed.        . Omega-3 Fatty Acids (FISH OIL) 1000 MG CAPS Take by mouth daily.        . perindopril (ACEON) 8 MG tablet Take 8 mg by mouth daily.        . pravastatin (PRAVACHOL) 80 MG tablet Take 80 mg by mouth at bedtime.        . triamcinolone (KENALOG) 0.5 % ointment Apply topically 2 (two) times daily. For rash        Review of Systems All otherwise neg per pt     Objective:   Physical Exam BP 98/68  Pulse 81  Temp(Src) 98 F (36.7 C) (Oral)  Ht 5\' 11"  (1.803 m)  Wt 216 lb 8 oz (98.204 kg)  BMI 30.20 kg/m2  SpO2 97% Physical Exam  VS noted Constitutional: Pt appears well-developed and well-nourished.  HENT: Head: Normocephalic.  Right Ear: External ear normal.  Left Ear:  External ear normal.  Eyes: Conjunctivae and EOM are normal. Pupils are equal, round, and reactive to light.  Neck: Normal range of motion. Neck supple.  Cardiovascular: Normal rate and regular rhythm.   Pulmonary/Chest: Effort normal and breath sounds normal.  Abd:  Soft, NT, non-distended, + BS Neurological: Pt is alert. No cranial nerve deficit. motor intact except for 4+/5 distal RUE weakness Skin: Skin is warm. No erythema.  Psychiatric: Pt behavior is normal. Thought content normal. 1+ nervous Right ball of foot at 2nd MTP area marked tender without redness, swelling, rash, ulcer        Assessment & Plan:

## 2011-03-14 NOTE — Assessment & Plan Note (Signed)
New onset very tender right ball of foot,  At second MTP area - for podiatry referral

## 2011-03-14 NOTE — Assessment & Plan Note (Signed)
New onset x 4 wks, with new mild RLE weakness but no gait change or fall so far, but will need MRI LS Spine, and NS referral, will try predpack as well

## 2011-03-15 ENCOUNTER — Encounter: Payer: Self-pay | Admitting: Cardiology

## 2011-03-18 ENCOUNTER — Encounter: Payer: Self-pay | Admitting: Cardiology

## 2011-03-18 ENCOUNTER — Ambulatory Visit (INDEPENDENT_AMBULATORY_CARE_PROVIDER_SITE_OTHER): Payer: BC Managed Care – PPO | Admitting: Cardiology

## 2011-03-18 VITALS — BP 128/98 | HR 66 | Resp 18 | Ht 71.0 in | Wt 217.1 lb

## 2011-03-18 DIAGNOSIS — I2510 Atherosclerotic heart disease of native coronary artery without angina pectoris: Secondary | ICD-10-CM

## 2011-03-18 NOTE — Assessment & Plan Note (Signed)
Stable, no change in medical therapy. 

## 2011-03-18 NOTE — Patient Instructions (Signed)
Your physician recommends that you schedule a follow-up appointment in: 1 year with Dr. Wall  

## 2011-03-18 NOTE — Progress Notes (Signed)
   Patient ID: [name], male    DOB: [birth year], 65 y.o.   MRN: [id number]  HPI  Mr Koral returns for E and M of his CAD and history of a MI. He remains active without any symptoms of angina or chest pain. He denies palpitations, edema or SOB.  Primary care is checking his blood work.  EKG shows NSR and normal EKG.    Review of Systems  All other systems reviewed and are negative.      Physical Exam  Nursing note and vitals reviewed. Constitutional: He is oriented to person, place, and time. He appears well-developed and well-nourished. No distress.  HENT:  Head: Normocephalic and atraumatic.  Eyes: EOM are normal. Pupils are equal, round, and reactive to light.  Neck: Normal range of motion. Neck supple. No JVD present. No tracheal deviation present. No thyromegaly present.       No bruits  Cardiovascular: Normal rate, regular rhythm, normal heart sounds and intact distal pulses.   No murmur heard. Pulmonary/Chest: Effort normal and breath sounds normal. No respiratory distress. He has no wheezes.  Abdominal: Soft. Bowel sounds are normal.       No bruits  Musculoskeletal: Normal range of motion. He exhibits no edema.  Neurological: He is alert and oriented to person, place, and time.  Skin: Skin is warm and dry.  Psychiatric: He has a normal mood and affect.

## 2011-03-24 ENCOUNTER — Encounter: Payer: Self-pay | Admitting: Internal Medicine

## 2011-03-24 NOTE — Assessment & Plan Note (Signed)
stable overall by hx and exam, most recent lab reviewed with pt, and pt to continue medical treatment as before  BP Readings from Last 3 Encounters:  03/18/11 128/98  03/14/11 98/68  04/06/10 110/68

## 2011-03-25 ENCOUNTER — Ambulatory Visit (HOSPITAL_COMMUNITY)
Admission: RE | Admit: 2011-03-25 | Discharge: 2011-03-25 | Disposition: A | Discharge status: Home / Self Care | Payer: BC Managed Care – PPO | Source: Ambulatory Visit | Attending: Internal Medicine | Admitting: Internal Medicine

## 2011-03-25 DIAGNOSIS — M5416 Radiculopathy, lumbar region: Secondary | ICD-10-CM

## 2011-03-25 DIAGNOSIS — M129 Arthropathy, unspecified: Secondary | ICD-10-CM | POA: Insufficient documentation

## 2011-03-25 DIAGNOSIS — M545 Low back pain, unspecified: Secondary | ICD-10-CM | POA: Insufficient documentation

## 2011-03-25 DIAGNOSIS — M7130 Other bursal cyst, unspecified site: Secondary | ICD-10-CM | POA: Insufficient documentation

## 2011-03-25 DIAGNOSIS — R29898 Other symptoms and signs involving the musculoskeletal system: Secondary | ICD-10-CM | POA: Insufficient documentation

## 2011-03-25 DIAGNOSIS — M5126 Other intervertebral disc displacement, lumbar region: Secondary | ICD-10-CM | POA: Insufficient documentation

## 2011-03-25 DIAGNOSIS — M47817 Spondylosis without myelopathy or radiculopathy, lumbosacral region: Secondary | ICD-10-CM | POA: Insufficient documentation

## 2011-03-28 ENCOUNTER — Telehealth: Payer: Self-pay | Admitting: Cardiology

## 2011-03-28 ENCOUNTER — Other Ambulatory Visit: Payer: Self-pay | Admitting: Neurosurgery

## 2011-03-28 DIAGNOSIS — M545 Low back pain: Secondary | ICD-10-CM

## 2011-03-28 NOTE — Telephone Encounter (Signed)
Pt calling re verifying med list and if any of his meds are blood thinners

## 2011-03-29 NOTE — Telephone Encounter (Signed)
Pt is having a lumbar spinal injection at [facility] and wanted to review his medication list with [place].  He has also stated that he has not been on plavix since last month.  He does not wish to restart it as he is over 1 year out from angioplasty.  I will update medication list. Mylo Red RN

## 2011-04-01 ENCOUNTER — Ambulatory Visit
Admission: RE | Admit: 2011-04-01 | Discharge: 2011-04-01 | Disposition: A | Discharge status: Home / Self Care | Payer: BC Managed Care – PPO | Source: Ambulatory Visit | Attending: Neurosurgery | Admitting: Neurosurgery

## 2011-04-01 DIAGNOSIS — M545 Low back pain: Secondary | ICD-10-CM

## 2011-04-09 NOTE — Assessment & Plan Note (Signed)
[facility]                            CARDIOLOGY OFFICE NOTE   [name], [name]                    MRN:          [id number]  DATE:07/29/2008                            DOB:          Jul 01, 1946    [name] comes in today for followup.   PROBLEM LIST:  1. Coronary artery disease.  He is status post inferior wall infarct      in June of 2004.  He is currently having no angina or ischemia.  He      has taken up the game of golf, which he is very excited about.  His      last stress Myoview was August 26, 2007, showed excellent exercise      tolerance, EF 53%.  No scar ischemia.  2. Hyperlipidemia followed by Dr. Posey Rea, MD.  3. Hypertension.   MEDICATIONS:  Medications from last year have not changed except that he  is on 81 mg of aspirin.  He takes meloxicam 15 mg a day.  He is still on  Zetia and Crestor.  He is taking Aceon as well as metoprolol.   PHYSICAL EXAMINATION:  He is in his usual jovial mood, telling lots of  jokes and then enjoying talking about playing golf.  His blood pressure  is 108/74.  Pulse is 88 and regular.  His weight is 218.  HEENT is  normal.  Carotids upstrokes were equal bilaterally without bruits.  No  JVD.  Thyroid is not enlarged.  Trachea is midline.  Lungs are clear to  auscultation.  Heart reveals a regular rate and rhythm.  No gallop.  PMI  is nondisplaced, but poorly appreciated. Abdominal exam is soft.  Good  bowel sounds.  No midline bruit.  No hepatomegaly.  Extremities, no  cyanosis, clubbing, or edema.  Pulses are intact.  Neuro exam is intact.   EKG today is normal except for some nonspecific ST-segment changes and  nonspecific T-wave changes.   ASSESSMENT AND PLAN:  [name] is doing well.  I have made no changes  in the medical program.  We will see him back in a year at which time he  will be due a stress Myoview.     [name], MD, [facility]  Electronically Signed    TCW/MedQ  DD:  07/29/2008  DT: 07/30/2008  Job #: [id number]

## 2011-04-09 NOTE — Assessment & Plan Note (Signed)
[facility]                           PRIMARY CARE OFFICE NOTE   [name], [name] [name]                    MRN:          [id number]  DATE:04/24/2007                            DOB:          07/27/1946    PROCEDURE:  Cryosurgery.   INDICATION:  Wart right lower chin. Risk and benefits explained to the  patient in detail. He agreed to proceed. The lesion measuring about 3 mm  was treated with liquid nitrogen in the usual fashion. Tolerated well.   COMPLICATIONS:  None.     Georgina Quint. Plotnikov, MD  Electronically Signed    AVP/MedQ  DD: 04/24/2007  DT: 04/24/2007  Job #: [id number]

## 2011-04-09 NOTE — Assessment & Plan Note (Signed)
[facility]                            CARDIOLOGY OFFICE NOTE   [name], [name]                    MRN:          [id number]  DATE:08/20/2007                            DOB:          03-08-1946    [name] returns today for further management of the following  issues:  1. Coronary artery disease.  He has a history of an inferior wall      myocardial infarction.  He has had a negative stress Myoview in      2006.  He has normal left ventricular systolic function.  He is      having some chest tightness with stress at work.  He is not sure if      it is exertion related.  2. He had recent lipids and LFTs done as part of an insurance      physical.  His numbers are at goal.  Please see the chart.   His meds are unchanged since last year.  He continues to be followed by  Dr. Posey Rea.   He has no shortness of breath, nausea, vomiting with his chest  tightness.  He has had no orthopnea or PND.   EXAM:  Blood pressure is 114/70, his pulse 59 and regular, his weight is  214.  HEENT:  Unremarkable.  Carotid upstrokes are equal bilaterally without  bruits.  No JVD.  Thyroid is not enlarged.  Trachea is midline.  LUNGS:  Clear.  HEART:  A regular rate and rhythm.  PMI is nondisplaced, there is no  murmur.  ABDOMINAL EXAM:  Soft.  There is no midline bruit.  Good bowel sounds.  EXTREMITIES:  No edema, pulses are intact.  NEURO EXAM:  Intact.   Electrocardiogram is normal except for sinus brady.   ASSESSMENT AND PLAN:  [name] is having some chest tightness with  stress/exertion.  He has known coronary disease.  He is due a stress  Myoview.  Will arrange this off of metoprolol.   If it is negative for ischemia, will see him back in a year.     Bryan C. Daleen Squibb, MD, [facility]  Electronically Signed    TCW/MedQ  DD: 08/20/2007  DT: 08/20/2007  Job #: [id number]

## 2011-04-12 NOTE — Consult Note (Signed)
NAME:  [name], [name] NO.:  [id number]   MEDICAL RECORD NO.:  [id number]          PATIENT TYPE:  EMS   LOCATION:  [facility]                         FACILITY:  [facility]   PHYSICIAN:  Ok Anis, NPDATE OF BIRTH:  10-06-1946   DATE OF CONSULTATION:  06/20/2005  DATE OF DISCHARGE:                                   CONSULTATION   PRIMARY CARE PHYSICIAN:  Georgina Quint. Plotnikov, M.D. [facility].   PRIMARY CARDIOLOGIST:  Jesse Sans. Wall, M.D.   PATIENT PROFILE:  A 65 year old white Hayes with prior history of CAD who  presents to the emergency room today with a 2-week history of intermittent  resting chest pressure.   PROBLEM LIST:  1.  Chest pain/coronary artery disease.      1.  April 29, 2003, status post inferolateral ST-elevation myocardial          infarction.      2.  April 29, 2003, cardiac catheterization revealing left main moderate          irregularities, left anterior descending 30% proximal and 60%          distal, D2 40%, D3 40%, left circumflex normal, obtuse marginal 1          99% mid, right coronary artery 30% proximal, ejection fraction 50%          with lateral and mid inferior akinesis.  The obtuse marginal 1 was          successfully stented with a 3.0 x 25-mm Zeta bare-metal stent as          well as a 3.0 x 60-mm Taxus drug-eluting stent.      3.  July 05, 2004, exercise Myoview, exercise time 8 minutes 5          seconds, heart rate 151 beats per minute.  ECG revealed 1 to 1.5-mm          ST-segment depression in leads II, III, and aVF.  Ejection fraction          was 58%.  Inferior and inferolateral changes, question attenuation          versus prior infarct.  2.  Hypertension.  3.  Hyperlipidemia.  4.  Remote tobacco abuse.  He smoked 2 packs a day for 32 years and quit in      June 2004.  5.  Gout.  6.  Nephrolithiasis.  7.  Left inguinal hernia.  8.  Prostate carcinoma, status post radical prostatectomy.   HISTORY OF PRESENT ILLNESS:  A  [name] with prior history of  CAD, status post inferolateral MI in June 2004.  He has subsequently been  followed by Dr. Valera Castle and last underwent a functional study in August  2005 which showed no evidence of ischemia.  He had been doing well without  any symptoms until approximately 2 weeks ago when he began to experience  resting, 3 to 5 out of Bryan, left chest pressure without associated symptoms  or radiation, occurring every other day and lasting 1 to 2 hours, and  resolving spontaneously.  He  has never taken nitroglycerin for this.  Notably, he would be able to perform heavy exertion at work without any  limitations or chest pain.  Over the past 4 days, his symptoms have been  occurring several times per day and lasting up to 3 to 4 hours.  Since 2:30  p.m. yesterday, he has had 4 episodes, initially occurring between 2:30 and  5:30 yesterday, and then he went to work where he performed heavy exertion  for about 3 hours without any symptoms, and then after work had recurrent  symptoms from about 8:30 to 11:00 p.m. last night.  He slept well last  night, but when he woke up this morning at 5:30 a.m., he again had 3 to 5  out of Bryan left chest pressure that was not worsened with exertion and  persisted until about 9:30.  Because of recurrent, increased frequency, and  increased duration, he decided to present to the ED this afternoon for  further evaluation.  He is currently pain-free.   ECG shows sinus bradycardia and is otherwise normal.  He states his pain is  not similar to what he had with his MI which was severe and crushing pain  with radiation down both arms.   FAMILY HISTORY:  His mother died at age 55 after years of alcohol abuse.  His father had an MI in his early 64s and died of an MI at age 16.  He has  one brother and one sister who are both alive and well.   SOCIAL HISTORY:  He lives in [place].  He continues to work in  [employer].  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  He previously smoked 2 packs a day for 32 years and quit in June 2004, and  he continues to drink 2 beers or 2 glasses of wine or 2 mixed drinks daily.  He denies any use of drugs and does not routinely exercise outside of the  exertion he performs at work.   REVIEW OF SYSTEMS:  Positive for chest pressure and history of gout.  Otherwise, all systems are reviewed and are negative.   PHYSICAL EXAMINATION:  VITAL SIGNS:  Temperature 98.8 degrees, heart rate  63, respirations 18, and blood pressure 136/74.  Pulse oximetry is 94% on  room air.  GENERAL:  A pleasant white Hayes in no acute distress.  Awake, alert, and  oriented x3.  NECK:  Normal carotid upstrokes.  No bruits or JVD.  LUNGS:  Respirations are regular and unlabored.  Clear to auscultation.  HEART:  Regular S1 and S2.  No S3, S4, or murmurs.  ABDOMEN:  Round, soft, not distended, nontender.  Bowel sounds present x4.  EXTREMITIES:  Warm and dry.  Pink.  No clubbing, cyanosis, or edema.  Dorsalis pedis and posterior tibial pulses 2+ and equal bilaterally.  No  femoral bruits are noted.   STUDIES:  His chest x-ray is currently pending.  The ECG shows sinus  bradycardia with a rate of 59 beats per minute, normal axis, and no ST or T  changes.  All of his labs are currently pending.   ASSESSMENT/PLAN:  1.  Chest pain/coronary artery disease.  The patient presents with a 2-week      history of somewhat atypical chest pain that has been occurring at rest,      without any exertional symptoms.  He  also notes that his symptoms are      not similar to previous angina.  Over the past 4 days, his symptoms have      been occurring more frequently and lasting longer.  He is currently pain-     free without any ECG changes.  Will plan to cycle cardiac enzymes and      continue his home medications which include  an aspirin, statin, beta      blocker, and ACE inhibitor.  Will also add a PPI.  Presuming that the      patient's cardiac enzymes are negative, he can likely be discharged home      from the ER with outpatient functional study and close cardiology      followup with Dr. Daleen Squibb.  If, however, his enzymes are positive, we would      plan on admission and cardiac catheterization.  2.  Hypertension, currently stable.  The patient indicates that his beta      blocker is prescribed b.i.d.; however, for simplicity and convenience,      he only takes it daily.  He is not interested in a long-acting beta      blocker secondary to cost and says he will start taking it b.i.d.  He      will continue his Aceon.  3.  Hyperlipidemia.  Check FLP and LFTs.  Continue his statin and Zetia.  He      notes that he recently ran out of      Zetia about 7 or Bryan days ago, and it has not been renewed by our office.      He was not given any communication that he was not supposed to continue      on this and will renew his prescription.  4.  Gout, stable.  He takes allopurinol daily.       CRB/MEDQ  D:  06/20/2005  T:  06/20/2005  Job:  [id number]

## 2011-04-12 NOTE — Op Note (Signed)
NAME:  [name], [name] NO.:  [id number]   MEDICAL RECORD NO.:  [id number]                   PATIENT TYPE:  OBV   LOCATION:  0098                                 FACILITY:  [facility]   PHYSICIAN:  Excell Seltzer. Annabell Howells, M.D.                 DATE OF BIRTH:  1946/11/04   DATE OF PROCEDURE:  07/19/2004  DATE OF DISCHARGE:                                 OPERATIVE REPORT   PROCEDURE:  Cystoscopy, insertion of right double-J stent.   PREOPERATIVE DIAGNOSIS:  Right ureterovesical junction stent with  pyelonephritis.   POSTOPERATIVE DIAGNOSIS:  Right ureterovesical junction stent with  pyelonephritis.   SURGEON:  Excell Seltzer. Annabell Howells, M.D.   ANESTHESIA:  General.   DRAINS:  A 6 French 26 cm double-J stent.   COMPLICATIONS:  None.   INDICATIONS:  [name] is a 65 year old white male with a history of  ureterolithiasis, who presented with a just under a week history of some  right flank pain.  CT in the ER on Tuesday demonstrated an 8 mm right distal  ureteral stone.  Last night, he developed a fever to 103.  He is now to  undergo cystoscopy and stent insertion to decompress the collecting system  and allow him to recover from the infection.  He will undergo a ureteroscopy  at a later date.   FINDINGS/PROCEDURE:  The patient was given 80 mg of tobramycin and 1 gm of  Rocephin in our office earlier today.  He was taken to the operating room  where general anesthetic was induced.  He was placed in a lithotomy  position.  His perineum and genitalia were prepped with Betadine solution,  and he was draped in the usual sterile fashion.  Cystoscopy was performed  using a 22 [place] scope and 12 and 70 degree lenses.  Examination revealed a  normal urethra.  The external sphincter was intact.  The prostate and  seminal vesicles were absent from prior prostatectomy.  The ureteral  orifices were in the normal anatomic position.  There was some erythema  around the right ureteral  orifice.  The bladder wall had mild trabeculation  without tumors noted.  The right ureteral orifice was cannulated with a  standard guidewire.  This was passed the stone up to the kidney.  A 6 French  26 cm double-J stent was then inserted over the wire without difficulty.  Once to the kidney, the wire was removed, leaving good coil in the kidney  and the bladder.  Turbid urine was noted to efflux from the stent.  The  bladder was then drained.  The scope was removed.  The patient was taken  down from the lithotomy position, and his anesthetic was reversed.  He was  taken to the recovery room in stable condition.  There were no  complications.  Excell Seltzer. Annabell Howells, M.D.   JJW/MEDQ  D:  07/19/2004  T:  07/19/2004  Job:  [id number]   cc:   Georgina Quint. Plotnikov, M.D. [facility]

## 2011-04-12 NOTE — Discharge Summary (Signed)
NAME:  [name], [name] NO.:  [id number]   MEDICAL RECORD NO.:  [id number]                   PATIENT TYPE:  INP   LOCATION:  3714                                 FACILITY:  [facility]   PHYSICIAN:  Jesse Sans. Wall, M.D.                DATE OF BIRTH:  1945-12-01   DATE OF ADMISSION:  04/29/2003  DATE OF DISCHARGE:  05/02/2003                           DISCHARGE SUMMARY - REFERRING   PROCEDURES:  Emergent coronary angiogram/stent of circumflex on April 29, 2003.   REASON FOR ADMISSION:  Please refer to dictated admission note.   LABORATORY DATA:  Cardiac enzymes:  Peak CPK 3034/294 (9.7%); troponin I  55.07.  Lipid profile:  Total cholesterol 210, triglyceride 235, HDL 68, LDL  95 (ratio 3.1).  TSH 2.2.  Wbc 17.9, HGB 15, HCT 44, platelet 215 on  admission.  Sodium 136, potassium 3.8, glucose 134, BUN 17, creatinine 1.1  on admission.  Normal liver enzymes.  Homocysteine normal (10.58).   Admission CXR:  No active disease.   HOSPITAL COURSE:  Following presentation to [facility] emergency room with  complaint of chest pain the patient was found to have acute inferolateral  myocardial infarction by Dr. Valera Castle.  He was stabilized and directed to  the catheterization laboratory for emergent intervention.   The procedure was performed by Dr. Chales Abrahams (see report for full details)  revealing total occlusion of the obtuse marginal with otherwise  nonobstructive LAD notable for a 60% distal lesion and 30% proximal RCA.  LV  function was mildly depressed (EF 50%) with lateral/mid inferior akinesis.   Dr. Chales Abrahams proceeded with successful stenting (TAXUS) of the lesion to 0%  residual stenosis with no noted complications.  Six month course of Plavix  was recommended.   The patient was stabilized on medical therapy, with the addition of  Lopressor and substitution of Mevacor with Lipitor 20, given the patient's  lipid profile.   The patient was also referred  for smoking cessation consult.   The patient was cleared for discharge on hospital day #3 in hemodynamically-  stable condition with no complaints of chest pain.   DISCHARGE MEDICATIONS:  1. Plavix 75 mg daily (x6 months).  2. Coated aspirin 325 mg daily.  3. Lipitor 20 mg daily.  4. Aceon 80 mg daily.  5. Lopressor 50 mg b.i.d.  6. Prednisone taper as directed.  7. Allopurinol 300 mg daily.  8. Foltx one tablet daily.  9. Nitrostat 0.4 mg p.r.n.   INSTRUCTIONS:  1. No heavy lifting/strenuous activity or return to work until seen by     physician.  2. Low fat/cholesterol diet.  3. Stop smoking tobacco.  4. The patient is to call the office if there is any swelling/bleeding of     the groin.  5. The patient is instructed to follow up with Dr. Valera Castle in     approximately two weeks, with  arrangements provided through the office.   DISCHARGE DIAGNOSES:  1. Status post acute inferolateral myocardial infarction.     a. Emergent stent (TAXUS) 100% obtuse marginal.     b. Nonobstructive left anterior descending coronary artery and right        coronary artery disease.     c. Mild left ventricular dysfunction (ejection fraction 50%).  2. Dyslipidemia.  3. Tobacco.  4. History of hypertension.  5. Gout.     Gene Serpe, P.A. [facility]                      Thomas C. Wall, M.D.    GS/MEDQ  D:  05/02/2003  T:  05/02/2003  Job:  [id number]   cc:   Georgina Quint. Plotnikov, M.D. [facility]

## 2011-04-12 NOTE — Op Note (Signed)
[place]. [facility]  Patient:    [name], [name]                    MRN: [id number] Proc. Date: 08/07/00 Adm. Date:  [id number] Attending:  Evlyn Clines CC:         Sonda Primes, M.D. [facility]   Operative Report  PROCEDURE:  Radical retropubic prostatectomy with bilateral pelvic lymphadenectomy.  PREOPERATIVE DIAGNOSIS:  Stage T1C adenocarcinoma of the prostate.  POSTOPERATIVE DIAGNOSIS:  Stage T1C adenocarcinoma of the prostate.  SURGEON:  Excell Seltzer. Annabell Howells, M.D.  ANESTHESIA:  General.  ASSISTANT:  Mark C. Vernie Ammons, M.D.  DRAINS:  22 French Foley and a 10 Blake Drain.  SPECIMENS:  Prostate, seminal vesicles and bilateral pelvic lymph nodes.  COMPLICATIONS:  None.  INDICATIONS:  [name] is a 65 year old white male who was found to have an elevated PSA.  He underwent biopsy and was found to have Gleason 6 adenocarcinoma in a portion of the right-sided biopsies.  After discussing treatment options he elected radical prostatectomy.  FINDINGS/PROCEDURE:  The patient was taken to the operating room where general anesthetic was induced.  He was given Ancef preoperatively.  He was secured with thigh______ and PAS hose.  A bump was placed under his pelvis, and the table was flexed.  His abdomen was shaved.  He was prepped with Betadine solution and draped in the usual sterile fashion.  A Foley catheter was inserted and the bladder was drained.  A lower midline incision was made with a knife.  This was carried through the subcutaneous and muscular fascial layers with a Bovie.  The transversalis fascia was opened and blunt dissection was used to expose the right and left pelvic fossas.  A Buchwalter retractor was placed.  Right pelvic lymph node dissection was performed with the ramus dissection being the external iliac vein, the obturator nerve, the circumflex iliac vein and the bifurcation of the iliac artery.  ______ was used to control  vascular and lymphatic channels.  No evidence of gross disease was noted.  Nodes were sent for permanent section.  The left lymph node dissection was then performed in an identical fashion. Once again no gross nodal disease was noted and the nodes were sent for permanent section.  We then turned our attention to the prostate.  The endopelvic fascia was opened bilaterally and blunt dissection was used to free up the lateral aspects of the prostate.  The puboprostatic ligaments were taken down using the Bovie.  A ______ far clamp was placed beneath the dorsal vein complex above the urethra and a 1 Vicryl tie was placed around the dorsal vein complex.  The Bovie was then used to divide the dorsal vein complex distal to the apex of the prostate.  The dorsal vein complex was divided without significant bleeding.  The anterior urethra was exposed and it was opened. The Foley catheter was identified, and pulled into the way and it was clamped and cut using ______ traction.  A tonsil clamp was used to dissect the neurovascular bundle off the urethra on each side.  Great care was taken to avoid injury to the neurovascular bundle.  A moistened umbilical tape was placed beneath the urethra.  The remaining urethra was divided.  The rectourethralis muscles were divided.  The prostate was then dissected up off and on Denonvilliers fascia and the anterior rectal wall.  The lateral pedicles were taken town using right angle clips.  Once again, care  was taken to avoid neurovascular bundles.  Once the prostate was reflected cephalad the anterior leaflet of Denonvilliers fascia was incised.  The ampulla of the vas and the seminal vesicles were exposed.  The exposed ampulla of vas were dissected out and divided between large clips.  The seminal vesicles were dissected out and divided between large clips.  The tips were preserved.  At this point we turned our attention anteriorly.  Allis clamps were placed  on either side of the bladder neck.  The bladder neck was incised with a Bovie. Once opened the bladder was drained, the Foley catheter balloon was deflated, the Foley pulled up into the wound and clamped together with a ______ to provide traction on the prostate.  The ureteral orifices were identified and were found to be well away from the bladder neck.  The posterior bladder neck was divided.  The remaining fibromuscular attachments to the prostate and to the bladder were taken down using the Bovie and clips, and the specimen was removed.  At this point hemostasis in the bladder neck area was controlled with a Bovie. The bladder neck mucosa was everted using interrupted 4-0 chromic stitches, and the bladder neck was tightened to an appropriate diameter using a running 2-0 chromic at 6 oclock.  At this point the packs were removed from the pelvic floor.  Some bleeding was noted from the dorsal vein complex.  this was controlled with a figure-of-eight sutures 2-0 Vicryl.  A couple of small bleeders were noted in the pelvis.  These were controlled with clips.  Once again, great care was taken to avoid the neurovascular bundle.  Once adequate hemostasis had been achieved a fresh Foley catheter was inserted, 28 french, and the anastomotic sutures were placed.  The sutures were placed 2, 5, 7 and 10 oclock on the bladder neck and urethral stump.  A 1 Prolene was then placed through the eyes of the Foley and tied and brought through the bladder neck and out through the anterior bladder wall.  The Foley was placed in the bladder with the lumens filled with 15 cc of sterile fluid and the final anastomotic stitch was placed at 12 oclock.  The retractors were released and the anastomotic stitches were tied. Once they were tied there they were trimmed.  The tethering suture was brought through the right abdominal wall.  A #10 Blake drain was brought through the left abdominal wall through a  separate stab wound.  It was secured to the skin using a 1 silk tie.  The tethering stitch was secured over a  button.  The wound was examined for hemostasis.  No active bleeding was performed.  Sponge, needle and instrument counts were correct.  The wound was then closed using a running #1 PDS.  The subcutaneous tissue was irrigated and the skin was closed with clips.  The drain was placed to bulb suction, the Foley was irrigated and was clear.  It was placed to straight drainage.  Drapes were removed.  The anesthetic was reversed.  The patient was taken to the recovery room in stable condition.  There were no complications. DD:  08/07/00 TD:  08/08/00 Job: [id number]

## 2011-04-12 NOTE — Discharge Summary (Signed)
[place]. [facility]  Patient:    [name], [name]                   MRN: [id number] Adm. Date:  08/07/00 Disc. Date: 08/10/00 Attending:  Excell Seltzer. [name], M.D.                           Discharge Summary  HISTORY OF PRESENT ILLNESS:  Briefly, [name] is a 65 year old white male with the diagnosis of T1c adenocarcinoma of the prostate.  He has elected radical prostatectomy.  For the details of the history and physical, please see the health history assessment sheet.  LABORATORY DATA:  His final pathology was a Gleason 6, 3+3, T1b, N0, M0 adenocarcinoma of the prostate.  Admission hemoglobin 16.5, white count 9.1. Chemistries within normal limits.  Blood type A positive.  Antibody negative.  HOSPITAL COURSE:  On the day of admission, the patient was taken to the operating room where he underwent a radical retropubic prostatectomy with bilateral lymphadenectomy.  He tolerated the procedure well and was left with a Foley catheter and a Blake drain.  Postoperatively he did well.  On the first postoperative day he had a temperature of 100.1.  Incentive spirometry was encouraged.  He had his diet advanced as tolerated.  His pain was controlled postoperatively with Toradol and a morphine PCA.  On the second postoperative day his t-max was 101.  He had moderate drainage but was otherwise doing well.  Ambulation and incentive spirometry were encouraged. He was switched to p.o. analgesics.  On the third postoperative day he was complaining of a little bit of headache from the Percocet.  He was given Vicodin and did well with that.  His drainage had significantly declined, and his drain was removed.  He was felt to be ready for discharge home as he was tolerating a regular diet and was afebrile.  DISCHARGE DIAGNOSIS:  Pathologic T2b, N0, M0 Gleason 6 adenocarcinoma of the prostate.  COMPLICATIONS:  None during his admission.  DISCHARGE MEDICATIONS: 1. Cipro  500 mg p.o. b.i.d. 2. Vicodin 1-2 p.o. q.4-6h. p.r.n. pain.  FOLLOW-UP:  He is instructed to follow up with me in one week for staple removal.  DISPOSITION:  To home.  CONDITION ON DISCHARGE:  Improved.  PROGNOSIS:  Good. DD:  08/21/00 TD:  08/21/00 Job: [id number]

## 2011-04-12 NOTE — Op Note (Signed)
NAME:  [name], [name] NO.:  [id number]   MEDICAL RECORD NO.:  [id number]                   PATIENT TYPE:  AMB   LOCATION:  NESC                                 FACILITY:  [facility]   PHYSICIAN:  Excell Seltzer. Annabell Howells, M.D.                 DATE OF BIRTH:  1946-01-02   DATE OF PROCEDURE:  07/26/2004  DATE OF DISCHARGE:                                 OPERATIVE REPORT   PROCEDURE:  Right ureteroscopic stone extraction with holmium lasertripsy  and right ureteral stent insertion.   PREOPERATIVE DIAGNOSIS:  Right distal ureteral stone.   POSTOPERATIVE DIAGNOSIS:  Right distal ureteral stone.   SURGEON:  Dr. Bjorn Pippin   ANESTHESIA:  General.   SPECIMENS:  Stone fragments.   DRAINS:  A 6 French x 26 cm double-J stent.   COMPLICATIONS:  None.   INDICATIONS:  [name] is a 65 year old white male with a history of  urolithiasis, who presented last week with a fever of 103 and a large,  obstructing right distal ureteral stone.  He underwent placement of a stent  and antibiotic therapy and returns now for removal of the stone.   FINDINGS AT PROCEDURE:  The patient was given p.o. Cipro preoperatively, was  placed in lithotomy position.  His perineum and genitalia were prepped with  Betadine solution.  He was draped in the usual sterile fashion.  Cystoscopy  was performed using a 22 [place] scope and the 12-degree lens.  The stent was  grasped with the grasping forceps and removed.  A 6 French short  ureteroscope was then passed per urethra.  The stone was visualized and  engaged with a holmium laser and broken into manageable fragments.  The  fragments were then removed with a nitinol basket to the bladder.  A  guidewire was then passed per the ureteroscope to the kidney, and the  ureteroscope was removed.  The cystoscope was then passed alongside the  wire.  The stone fragments were evacuated, and the bladder was drained.  The  cystoscope was then reinserted  over the wire, and a 6 French 26 cm double-J  stent with string was placed without difficulty under fluoroscopic guidance.  Upon removal of the wire, good coil was noted in the kidney and the bladder.  The string was then taped to the patient's penis.  He was taken down from  lithotomy position.  His anesthetic was reversed.  He was removed to the  recovery room in stable condition.  There were no complications.                                               Excell Seltzer. Annabell Howells, M.D.   JJW/MEDQ  D:  07/26/2004  T:  07/26/2004  Job:  [id number]   cc:  Georgina Quint. Plotnikov, M.D. [facility]

## 2011-04-12 NOTE — H&P (Signed)
NAME:  Bryan, Hayes NO.:  [id number]   MEDICAL RECORD NO.:  [id number]                   PATIENT TYPE:  INP   LOCATION:  1826                                 FACILITY:  [facility]   PHYSICIAN:  Jesse Sans. Wall, M.D.                DATE OF BIRTH:  1946/05/08   DATE OF ADMISSION:  04/29/2003  DATE OF DISCHARGE:                                HISTORY & PHYSICAL   HISTORY OF PRESENT ILLNESS:  [name] is a very pleasant and  humorous 65 year old white male who comes in with an acute inferolateral  with probable posterior and RV involvement that awoke him from sleep around  12 midnight; he is about 1 hour and 15 minutes into his MI.   He has had no previous cardiac history.  His risk factors include male, age,  hypertension, hyperlipidemia, family history, and tobacco use.   He is currently having about 8/10 chest pain on IV nitro, heparin, and he  has received aspirin.  Cath lab has been called.   ALLERGIES:  He is intolerant of HYDROCODONE, which causes nausea.   OTHER HISTORY:  1. Includes gout, which he is currently recovering from an acute gouty     attack and is on a prednisone taper.  2. He also has a history of prostate cancer and had a radical prostatectomy.  3. He smokes about one pack of cigarettes per day.  He has a couple of     drinks each night.  4. He also has a history of hyperlipidemia.   CURRENT MEDICATIONS:  1. Lovastatin 40 mg p.o. daily.  2. Aceon 8 mg per day.  3. Aspirin 325 mg per day.  4. Allopurinol 300 mg per day.  5. Hydrocodone  p.r.n. for pain.  6. Indomethacin 25 to 50 mg p.o. p.r.n. for pain.  7. He is currently on a prednisone taper and on 5 mg per day for another     three or four days.   FAMILY HISTORY:  Pertinent for his father having an MI in his early 76s.   SOCIAL HISTORY:  He is a Corporate investment banker.  He lives in [place].  He  has five children.  He is with his friend tonight, [name].   REVIEW OF SYSTEMS:  Noncontributory to the HPI otherwise.  He denies any  recent bleeding or bleeding diathesis.   PHYSICAL EXAMINATION:  VITAL SIGNS:  Show a blood pressure of 147/91, his  heart rate is 70 and regular, he is in sinus rhythm, his respiratory rate is  22, and he is afebrile.  His O2 sat is 97% on 2 liters of nasal cannula.  HEENT EXAM:  Unremarkable.  NECK:  Shows no JVD.  Carotid upstrokes are equal bilaterally without  bruits.  There is no thyromegaly.  The trachea is midline.  LUNGS:  Clear.  HEART:  Reveals a regular rate and rhythm without  gallop.  ABDOMINAL EXAM:  Soft.  Good bowel sounds.  There is no hepatomegaly.  EXTREMITIES:  With no clubbing, cyanosis, or edema.  Pulses are intact.  NEURO EXAM:  Intact.   ASSESSMENT:  1. Acute inferolateral and posterior myocardial infarction.  There is a     question of right ventricular involvement also by electrocardiogram.  2. Hypertension.  3. Hypokalemia.  4. Mild renal insufficiency with creatinine 1.6.  5. Hyperlipidemia.  6. Tobacco use.  7. Family history of coronary disease.  8. Gouty arthritis, currently with a flare.   PLAN:  1. Emergent catheterization by Dr. Veneda Melter.  Indications, risks,     potential benefits were discussed with him and his friend, [name].  2. Replete K once in the cath lab.  3. Continue current medications, including prednisone taper for his gouty     arthritis.  4. DC tobacco.  5. Add metoprolol 25 mg b.i.d.                                               Thomas C. Wall, M.D.    TCW/MEDQ  D:  04/29/2003  T:  04/29/2003  Job:  [id number]   cc:   Georgina Quint. Plotnikov, M.D. [facility]

## 2011-04-12 NOTE — Op Note (Signed)
NAME:  [name], [name] NO.:  [id number]   MEDICAL RECORD NO.:  [id number]                   PATIENT TYPE:  AMB   LOCATION:  DAY                                  FACILITY:  [facility]   PHYSICIAN:  Angelia Mould. Derrell Lolling, M.D.             DATE OF BIRTH:  09/29/1946   DATE OF PROCEDURE:  11/02/2003  DATE OF DISCHARGE:                                 OPERATIVE REPORT   PREOPERATIVE DIAGNOSIS:  Left inguinal hernia.   POSTOPERATIVE DIAGNOSIS:  Left inguinal hernia.   OPERATION PERFORMED:  Repair of left inguinal hernia with mesh Armanda Heritage  repair).   SURGEON:  Angelia Mould. Derrell Lolling, M.D.   INDICATIONS FOR PROCEDURE:  This is a 65 year old white man who has noticed  some discomfort in his left groin for 3-4 months.  He has noticed a bulge  there more recently.  He has actually had to lie down and push the bulge  back in at least three times.  No prior hernia problems. He has had a  radical prostatectomy with prostate cancer and he has also had a penile  prosthesis placed subsequently.  On exam, he has a moderate size left  inguinal hernia that is reducible.  He is brought to the operating room  electively.   TECHNIQUE:  Following the induction of the general LMA anesthesia, the  patient's left groin was prepped and draped in a sterile fashion.  0.5%  Marcaine with epinephrine was used as a local infiltration anesthetic and a  left ilioinguinal nerve block.  An oblique incision was made in the left  groin overlying the inguinal canal. Dissection was carried down through the  subcutaneous tissue exposing the aponeurosis of the external oblique.  The  external oblique was incised in the direction of its fibers, opening up the  external inguinal ring.  The external oblique was retracted.  The cord  structures were mobilized and encircled with a Penrose drain.  The  ilioinguinal nerve was intimately associated with the cord structures, was  dissected away and at the  level of the internal ring and lateral to that it  was clamped, divided and ligated with a 2-0 silk tie.  A large lipoma of the  cord was dissected away from the cord structures and hernia sac all the way  back to the internal ring.  It took two hemostats to clamp the base of the  lipoma which was then amputated and then the stumps of the lipoma were  ligated with 3-0 Vicryl ties.  A large indirect hernia sac was present and  was dissected away from the cord structures all the way back to the internal  ring.  The indirect sac was opened and I found some omentum and fatty tissue  in this which was easily reduced back into the abdominal cavity.  The  indirect hernia sac was then twisted and suture ligated at the level of the  internal  ring with a suture ligature of 2-0 silk. The redundant sac was  excised.  The floor of the inguinal canal was bulging a little bit  suggesting the beginning of a direct inguinal hernia.  The floor of the  inguinal canal was repaired and reinforced with an onlay graft of  polypropylene mesh.  A 3 inch x 6 inch piece of polypropylene mesh was cut  into an appropriate shape and sutured in place with running sutures of 2-0  Prolene.  The mesh was sutures so as to generously overlap the fascia at the  pubic tubercle, along the inguinal ligament inferiorly, and then along the  internal oblique and conjoined tendon superiorly.  The mesh was incised  laterally so as to wrap around the cord structures at the internal ring.  The tails of the mesh were overlapped laterally and the suture lines  completed.  This provided a very secure repair both medial and lateral to  the internal ring but allowed an adequate opening for the cord structures.  The wound was irrigated with saline.  Hemostasis was excellent.  The cord  structures were returned to their anatomic position.   It should be noted that there was about a 1.5 cm firm nodule associated with  the cord structures  easily dissected away. This had the look and feel of a  chronic hematoma.  This was dissected away easily and sent for routine  histologic exam.   The external oblique was closed with a running suture of 2-0 Vicryl.  The  Scarpa's fascia was closed with 3-0 Vicryl sutures and the skin closed with  a running subcuticular suture of 4-0 Vicryl and Steri-Strips.  Clean  bandages were placed and the patient taken to the recovery room in stable  condition.  Estimated blood loss was about 10 mL, complications none.  Sponge, needle and instrument counts were correct.                                               Angelia Mould. Derrell Lolling, M.D.    HMI/MEDQ  D:  11/02/2003  T:  11/02/2003  Job:  [id number]   cc:   Georgina Quint. Plotnikov, M.D. [facility]   Maisie Fus C. Wall, M.D.

## 2011-04-12 NOTE — Assessment & Plan Note (Signed)
[facility]                              CARDIOLOGY OFFICE NOTE   [name], [name] [name]                    MRN:          [id number]  DATE:06/25/2006                            DOB:          [birth year]    HISTORY OF PRESENT ILLNESS:  [name] returns today for further  management of coronary artery disease and hyperlipidemia.  He is doing  remarkably well with no sense of ischemia.  He had a negative stress Myoview  last year.   He is due lipids and LFT's.   His medications are unchanged since last year, except he is no longer on  Protonix.  His lipid regimen is Crestor 10 and Zetia 10.  He takes an  aspirin 325 mg daily, Aceon 8 mg daily for his pressure, Toprol CR 50 mg  daily.   PHYSICAL EXAMINATION:  VITAL SIGNS:  His blood pressure today is 92/64,  pulse 65 and regular, weight is stable at 207.  NECK:  Carotids are full without bruits.  There is no JVD.  Thyroid is not  enlarged.  Trachea is midline.  LUNGS:  Clear.  HEART:  Regular rate and rhythm without murmurs, rubs or gallops.  ABDOMEN:  Soft.  EXTREMITIES:  No cyanosis, clubbing or edema.  Pulses are intact.   STUDIES:  EKG is significant for only nonspecific T wave changes.  This is  stable.   ASSESSMENT/PLAN:  [name] is doing well.  We will check labs.  Assuming  these are stable, will make no changes in his program and see him back in  the year.                               [name], MD, [facility]    TCW/MedQ  DD:  06/25/2006  DT:  06/25/2006  Job #:  [id number]   cc:   Sonda Primes, MD

## 2011-04-12 NOTE — Cardiovascular Report (Signed)
NAME:  [name], [name] NO.:  [id number]   MEDICAL RECORD NO.:  [id number]                   PATIENT TYPE:  INP   LOCATION:  1826                                 FACILITY:  [facility]   PHYSICIAN:  Veneda Melter, M.D.                   DATE OF BIRTH:  [birth year]   DATE OF PROCEDURE:  04/29/2003  DATE OF DISCHARGE:                              CARDIAC CATHETERIZATION   PROCEDURES PERFORMED:  1. Left heart catheterization.  2. Left ventriculogram.  3. Selective coronary angiography.  4. PTCA and stent placement of the left circumflex artery.   DIAGNOSES:  1. Severe single vessel coronary artery disease  2. Acute lateral wall myocardial infarction.  3. Low normal left ventricular systolic function.   HISTORY:  The patient is a 65 year old white male with multiple  cardiovascular risk factors who presents with acute onset substernal chest  discomfort.  The patient notes onset of pain approximately 12:15 a.m.  Presented to the emergency room where he was found to have ST elevation  inferolateral leads and is referred for urgent angiography.   TECHNIQUE:  Informed consent was obtained.  The patient was brought to the  catheterization laboratory.  A 7-French sheath placed in the right femoral  artery using modified Seldinger technique.  A 6-French JL4 and JR4 catheter  was then used to engage the left and right coronary arteries and selective  angiography performed in various projections using manual injection of  contrast.  a 6-French pigtail catheter was then advanced in left ventricle  and left ventriculogram performed using power injections of contrast in two  views.   INITIAL FINDINGS:  1. Left main trunk:  Large caliber vessel with moderate irregularities.  2. LAD is a medium caliber vessel that provides three diagonal branches in     the mid section.  The LAD has mild irregularities of 30% in the proximal     segment between first and second diagonal  branches.  There is then a     focal narrowing of 40% in second and third diagonal branches.  The distal     LAD has focal narrowing of 60% prior to the apex.  The diagonal branches     have diffuse disease of 30-50%.  3. Left circumflex artery is as a large caliber vessel.  Consists of a large     bifurcating marginal branch in the mid section and AV continuation     provides two small trivial distal posterior ventricular branches.  This     large marginal branch has a subtotal occlusion in the mid section prior     to the bifurcation.  TIMI 1 flow is noted distally.  4. Right coronary artery is a dominant large caliber vessel that provides a     posterior descending artery and posterior ventricular branches in the     terminal segment.  The right coronary artery has  proximal narrowing of     30%.  The remainder of the vessel has mild diffuse disease of 20%.  5. LV:  Normal end-systolic and end-diastolic dimensions.  Overall left     ventricular function is low normal.  Ejection fraction approximately 50%.     There is a very akinesis of the lateral and mid inferior walls.  No     mitral regurgitation noted.  LV pressure is 140/10.  Aortic is 140/85.     LVEDP equals 15.   With these findings I would like to proceed with percutaneous intervention  left circumflex artery.  The patient had been pretreated with aspirin and  heparin.  He was also given heparin on a weight adjusted basis and Plavix  300 mg during the case.  A 7-French Q4 left guide catheter was used to  engage the left coronary artery.  A 0.014 Judkins port wire advanced through  the distal section of the marginal branch.  This was positioned in the  inferior branch of the marginal and a second X4 wire positioned in the  superior division.  A 3.0 x 16 mm Maverick balloon was introduced and used  to predilate the lesion.  A single inflation was performed up to the  bifurcation at 6 atmospheres for 45 seconds.  Repeat  angiography showed  recannulization of the vessel.  There is mild haziness at the site of  occlusion.  It is unclear whether this represented a thrombus or a  dissection.  A 3.0 x 15 mm Zeta stent was then introduced, carefully  positioned in the marginal branch extending up to the bifurcation, and  deployed at 14 atmospheres for 30 seconds.  Repeat angiography showed an  excellent result with coverage of this lesion.  There was unfortunately  severe narrowing of 70-80% prior to this stented segment extending up to a  small subbranch.  It was unclear what the cause of this was as there did not  appear to be critical stenosis in this segment.  It was possible there was  some vessel destruction during the intervention or proximal dissection.  Regardless, we elected to cover this with a second stent.  A 3.0 x 16 mm  Taxus stent was introduced, positioned proximal to the previous placed Zeta  stent with slight overlap and deployed at 16 atmospheres for 45 seconds.  A  3.25 x 20 mm Quantum Maverick balloon was used to post dilate the two  stents.  Two inflations were performed along the entire stented segment at  14 atmospheres for 30 seconds.  Repeat angiography was then performed after  administration of intracoronary nitroglycerin showing excellent result with  no residual stenosis, full coverage of the lesions, and TIMI 3 flow in the  distal vessel.  Final angiography was performed in various projections to  confirm these findings.  Guide catheter was then removed and sheath secured  into position.  The patient tolerated procedure well and had substantial  relief of pain following the intervention with resolution of ST elevation.  He was given 15 mg IV beta blocker during the case with reduction of heart  rate from 95 beats per minute to 80 beats per minute.  He was transferred to  the CCU in stable condition.  FINAL RESULT:  Successful PTCA and stent placement to the marginal branch  left  circumflex artery with reduction of 100% narrowing to 0% with placement  of a 3.0 x 15 mm Zeta stent extending up to the bifurcation  preceded by a  3.0 x 16 mm Taxus Express 2 stent both dilated to 3.25 mm.                                               Veneda Melter, M.D.    Melton Alar  D:  04/29/2003  T:  04/29/2003  Job:  [id number]   cc:   Thomas C. Wall, M.D.   Georgina Quint. Plotnikov, M.D. [facility]

## 2011-04-23 ENCOUNTER — Ambulatory Visit (HOSPITAL_COMMUNITY): Payer: BC Managed Care – PPO | Attending: Cardiology | Admitting: Radiology

## 2011-04-23 VITALS — Ht 71.5 in | Wt 220.0 lb

## 2011-04-23 DIAGNOSIS — Z09 Encounter for follow-up examination after completed treatment for conditions other than malignant neoplasm: Secondary | ICD-10-CM | POA: Insufficient documentation

## 2011-04-23 DIAGNOSIS — Z87891 Personal history of nicotine dependence: Secondary | ICD-10-CM | POA: Insufficient documentation

## 2011-04-23 DIAGNOSIS — I2510 Atherosclerotic heart disease of native coronary artery without angina pectoris: Secondary | ICD-10-CM | POA: Insufficient documentation

## 2011-04-23 DIAGNOSIS — I252 Old myocardial infarction: Secondary | ICD-10-CM | POA: Insufficient documentation

## 2011-04-23 DIAGNOSIS — Z01810 Encounter for preprocedural cardiovascular examination: Secondary | ICD-10-CM

## 2011-04-23 DIAGNOSIS — E785 Hyperlipidemia, unspecified: Secondary | ICD-10-CM | POA: Insufficient documentation

## 2011-04-23 DIAGNOSIS — I10 Essential (primary) hypertension: Secondary | ICD-10-CM | POA: Insufficient documentation

## 2011-04-23 MED ORDER — REGADENOSON 0.4 MG/5ML IV SOLN
0.4000 mg | Freq: Once | INTRAVENOUS | Status: AC
  Administered 2011-04-23: 0.4 mg via INTRAVENOUS

## 2011-04-23 MED ORDER — TECHNETIUM TC 99M TETROFOSMIN IV KIT
33.0000 | PACK | Freq: Once | INTRAVENOUS | Status: AC | PRN
  Administered 2011-04-23: 33 via INTRAVENOUS

## 2011-04-23 MED ORDER — TECHNETIUM TC 99M TETROFOSMIN IV KIT
11.0000 | PACK | Freq: Once | INTRAVENOUS | Status: AC | PRN
  Administered 2011-04-23: 11 via INTRAVENOUS

## 2011-04-23 NOTE — Progress Notes (Signed)
[facility] SITE 3 NUCLEAR MED [address] [place] [address] [phone]  Cardiology Nuclear Med Study  [name] is a 65 y.o. male [id number] 03-15-1946   Nuclear Med Background Indication for Stress Test:  Evaluation for Ischemia, Pending Surgical Clearance: Lumbar surgery on 04/25/11; Dr. Julio Sicks, Stent Patency and PTCA Patency History: 11 Angioplasty, 11 Echo:mild LVH with EF=60-65,11 Heart Catheterization 80% in stent restenosis,04 Myocardial Infarction,10 Myocardial Perfusion Study: Post WMI Stemi with EF=53% and 04 and 11 Stents=CFX Cardiac Risk Factors: Family History - CAD, History of Smoking, Hypertension and Lipids  Symptoms:  none   Nuclear Pre-Procedure Caffeine/Decaff Intake:  None NPO After: 6:00am   Lungs:  clear IV 0.9% NS with Angio Cath:  18g  IV Site: R Antecubital  IV Started by:  Stanton Kidney, EMT-P  Chest Size (in):  42 Cup Size: n/a  Height: 5' 11.5" (1.816 m)  Weight:  220 lb (99.791 kg)  BMI:  Body mass index is 30.26 kg/(m^2). Tech Comments:  Metoprolol not taken this am, per patient.    Nuclear Med Study 1 or 2 day study: 1 day  Stress Test Type:  Eugenie Birks  Reading MD: Marca Ancona, MD  Order Authorizing Provider:  Valera Castle MD  Resting Radionuclide: Technetium 2m Tetrofosmin  Resting Radionuclide Dose: 11 mCi   Stress Radionuclide:  Technetium 64m Tetrofosmin  Stress Radionuclide Dose: 33 mCi           Stress Protocol Rest HR: 76 Stress HR: 93  Rest BP: 133/92 Stress BP: 143/94  Exercise Time (min): n/a METS: n/a   Predicted Max HR: 156 bpm % Max HR: 59.62 bpm Rate Pressure Product: 10272   Dose of Adenosine (mg):  n/a Dose of Lexiscan: 0.4 mg  Dose of Atropine (mg): n/a Dose of Dobutamine: n/a mcg/kg/min (at max HR)  Stress Test Technologist: Cathlyn Parsons, RN  Nuclear Technologist:  Domenic Polite, CNMT     Rest Procedure:  Myocardial perfusion imaging was performed at rest 45 minutes  following the intravenous administration of Technetium 72m Tetrofosmin. Rest ECG: NSR  Stress Procedure:  The patient received IV Lexiscan 0.4 mg over 15-seconds.  Technetium 63m Tetrofosmin injected at 30-seconds.  There were no significant changes with Lexiscan.  Quantitative spect images were obtained after a 45 minute delay. Stress ECG: No significant change from baseline ECG  QPS Raw Data Images:  Normal; no motion artifact; normal heart/lung ratio. Stress Images:  Mild basal inferior and inferolateral perfusion defect.  Rest Images:  Mild basal inferior and inferolateral perfusion defect.  Subtraction (SDS):  Fixed mild basal inferior and inferolateral perfusion defect.  Transient Ischemic Dilatation (Normal <1.22): 1.04 Lung/Heart Ratio (Normal <0.45):  .30  Quantitative Gated Spect Images QGS EDV:  123 ml QGS ESV:  57 ml QGS cine images:  Basal lateral hypokinesis.  QGS EF: 54%  Impression Exercise Capacity:  Lexiscan with no exercise. BP Response:  Normal blood pressure response. Clinical Symptoms:  Lightheaded and short of breath.  ECG Impression:  No evidence for ischemia or infarction by ST segment analysis Comparison with Prior Nuclear Study: Images from prior study not available.   Overall Impression:  Low risk stress test.  There is a fixed mild basal inferior and inferolateral perfusion defect with accompanying wall motion abnormality that suggests prior MI.  There is no significant ischemia.   [name] [employer]

## 2011-04-24 ENCOUNTER — Encounter (HOSPITAL_COMMUNITY): Payer: BC Managed Care – PPO | Admitting: Radiology

## 2011-04-24 ENCOUNTER — Encounter: Payer: Self-pay | Admitting: *Deleted

## 2011-04-24 NOTE — Progress Notes (Signed)
COPY ROUTED TO DR. WALL.Scarlette Ar

## 2011-04-25 HISTORY — PX: LUMBAR LAMINECTOMY: SHX95

## 2011-04-30 ENCOUNTER — Encounter: Payer: Self-pay | Admitting: Cardiology

## 2011-06-10 ENCOUNTER — Other Ambulatory Visit: Payer: Self-pay

## 2011-06-10 MED ORDER — ALLOPURINOL 300 MG PO TABS: 300.0000 mg | ORAL_TABLET | Freq: Every day | ORAL | Status: DC

## 2011-06-24 ENCOUNTER — Other Ambulatory Visit: Payer: Self-pay

## 2011-06-24 MED ORDER — ALLOPURINOL 300 MG PO TABS: 300.0000 mg | ORAL_TABLET | Freq: Every day | ORAL | Status: DC

## 2011-07-17 ENCOUNTER — Ambulatory Visit (INDEPENDENT_AMBULATORY_CARE_PROVIDER_SITE_OTHER): Payer: BC Managed Care – PPO | Admitting: Internal Medicine

## 2011-07-17 ENCOUNTER — Encounter: Payer: Self-pay | Admitting: Internal Medicine

## 2011-07-17 VITALS — BP 108/78 | HR 92 | Temp 99.2°F | Ht 71.0 in | Wt 218.5 lb

## 2011-07-17 DIAGNOSIS — J209 Acute bronchitis, unspecified: Secondary | ICD-10-CM

## 2011-07-17 DIAGNOSIS — I10 Essential (primary) hypertension: Secondary | ICD-10-CM

## 2011-07-17 DIAGNOSIS — M545 Low back pain, unspecified: Secondary | ICD-10-CM

## 2011-07-17 MED ORDER — LEVOFLOXACIN 500 MG PO TABS: 500.0000 mg | ORAL_TABLET | Freq: Every day | ORAL | Status: AC

## 2011-07-17 MED ORDER — HYDROCODONE-HOMATROPINE 5-1.5 MG/5ML PO SYRP: 5.0000 mL | ORAL_SOLUTION | Freq: Four times a day (QID) | ORAL | Status: AC | PRN

## 2011-07-17 NOTE — Patient Instructions (Signed)
Take all new medications as prescribed - the antibiotic and cough medicine Continue all other medications as before You can also take Mucinex (or it's generic off brand) for congestion

## 2011-07-21 ENCOUNTER — Encounter: Payer: Self-pay | Admitting: Internal Medicine

## 2011-07-21 NOTE — Progress Notes (Signed)
Subjective:    Patient ID: ABDULKADIR EMMANUEL, male    DOB: [birth year], 65 y.o.   MRN: [id number]  HPI  Here with acute onset mild to mod 2-3 days ST, HA, general weakness and malaise, with prod cough greenish sputum, but Pt denies chest pain, increased sob or doe, wheezing, orthopnea, PND, increased LE swelling, palpitations, dizziness or syncope.  Pt denies new neurological symptoms such as new headache, or facial or extremity weakness or numbness   Pt denies polydipsia, polyuria.  Does have sense of ongoing fatigue, but denies signficant hypersomnolence.  Overall good compliance with treatment, and good medicine tolerability. Pt continues to have recurring LBP without change in severity, bowel or bladder change, fever, wt loss,  worsening LE pain/numbness/weakness, gait change or falls. Past Medical History  Diagnosis Date  . HYPERLIPIDEMIA 06/14/2008  . GOUT 06/14/2008  . Carpal tunnel syndrome 06/07/2008  . HYPERTENSION 02/02/2010  . CORONARY ARTERY DISEASE 01/12/2008  . SINUSITIS- ACUTE-NOS 07/05/2009  . BRONCHITIS, ACUTE 10/17/2008  . DIVERTICULOSIS, COLON 06/14/2008  . RENAL INSUFFICIENCY, CHRONIC 02/02/2010  . Cellulitis and abscess of other specified site 06/14/2008  . OSTEOARTHRITIS 01/12/2008  . LOW BACK PAIN 01/12/2008  . LEG PAIN 01/10/2010  . PARESTHESIA 06/07/2008  . Epistaxis 03/15/2010  . CERVICAL STRAIN 06/07/2008  . PROSTATE CANCER, HX OF 01/12/2008  . TOBACCO USE, QUIT 01/10/2010  . Lumbar disc disease 03/14/2011  . S/P lumbar discectomy 03/14/2011   Past Surgical History  Procedure Date  . Lumbar laminectomy   . Prostatectomy   . S/p cervical surgury 2001  . Right ureteroscopic stone extraction with holmium lasertripsy and right ureteral stent insertion 9.1.2005  . Cystoscopy 07/19/2004    insertion of right double-J stent  . Repair of left inguinal hernia with mesh 11/02/2003    ([facility])    reports that he has quit smoking. He does not have any smokeless tobacco  history on file. His alcohol and drug histories not on file. family history includes Hypertension in his other. No Known Allergies Current Outpatient Prescriptions on File Prior to Visit  Medication Sig Dispense Refill  . allopurinol (ZYLOPRIM) 300 MG tablet Take 1 tablet (300 mg total) by mouth daily.  30 tablet  0  . aspirin 325 MG tablet Take 325 mg by mouth daily.        . Cholecalciferol (VITAMIN D3) 1000 UNITS CAPS Take by mouth daily.        [name] Kitchen ezetimibe (ZETIA) 10 MG tablet Take 10 mg by mouth daily.        [name] Kitchen HYDROcodone-acetaminophen (NORCO) 5-325 MG per tablet Take 1 tablet by mouth. 1-2 by mouth two times a day as needed for pain       . metoprolol tartrate (LOPRESSOR) 25 MG tablet Take 25 mg by mouth 2 (two) times daily.        . Multiple Vitamins-Minerals (CENTRUM PO) Take by mouth daily.        . naproxen sodium (ANAPROX) 220 MG tablet Take 220 mg by mouth as needed.        . nitroGLYCERIN (NITROSTAT) 0.4 MG SL tablet Place 0.4 mg under the tongue every 5 (five) minutes as needed.        . Omega-3 Fatty Acids (FISH OIL) 1000 MG CAPS Take by mouth daily.        . perindopril (ACEON) 8 MG tablet Take 8 mg by mouth daily.        . pravastatin (PRAVACHOL) 80 MG tablet Take  80 mg by mouth at bedtime.        . triamcinolone (KENALOG) 0.5 % ointment Apply topically 2 (two) times daily. For rash        Review of Systems Review of Systems  Constitutional: Negative for diaphoresis and unexpected weight change.  HENT: Negative for drooling and tinnitus.   Eyes: Negative for photophobia and visual disturbance.  Respiratory: Negative for choking and stridor.   Gastrointestinal: Negative for vomiting and blood in stool.        Objective:   Physical Exam BP 108/78  Pulse 92  Temp(Src) 99.2 F (37.3 C) (Oral)  Ht 5\' 11"  (1.803 m)  Wt 218 lb 8 oz (99.111 kg)  BMI 30.47 kg/m2  SpO2 96% Physical Exam  VS noted, mild ill Constitutional: Pt appears well-developed and well-nourished.    HENT: Head: Normocephalic.  Right Ear: External ear normal.  Left Ear: External ear normal.  Bilat tm's mild erythema.  Sinus nontender.  Pharynx mild erythema Eyes: Conjunctivae and EOM are normal. Pupils are equal, round, and reactive to light.  Neck: Normal range of motion. Neck supple.  Cardiovascular: Normal rate and regular rhythm.   Pulmonary/Chest: Effort normal and breath sounds normal.  Abd:  Soft, NT, non-distended, + BS Neurological: Pt is alert. No cranial nerve deficit. motor/gait intact LE's Skin: Skin is warm. No erythema.  Psychiatric: Pt behavior is normal. Thought content normal.         Assessment & Plan:

## 2011-07-21 NOTE — Assessment & Plan Note (Signed)
Mild to mod, for antibx course,  to f/u any worsening symptoms or concerns 

## 2011-07-21 NOTE — Assessment & Plan Note (Signed)
stable overall by hx and exam, most recent data reviewed with pt, and pt to continue medical treatment as before  BP Readings from Last 3 Encounters:  07/17/11 108/78  03/18/11 128/98  03/14/11 98/68

## 2011-07-21 NOTE — Assessment & Plan Note (Signed)
stable overall by hx and exam, and pt to continue medical treatment as before 

## 2011-08-07 ENCOUNTER — Telehealth: Payer: Self-pay | Admitting: *Deleted

## 2011-08-07 NOTE — Telephone Encounter (Signed)
Spoke w/pt -   1. C/o posion ivy on forearms, using home remedy of lemon juice and calamine which is giving some relief.  2. Fatigue x 2 wks, not sleeping well due to shoulder pain 3. Heel pain  Scheduled for OV Friday (30 min)

## 2011-08-09 ENCOUNTER — Other Ambulatory Visit (INDEPENDENT_AMBULATORY_CARE_PROVIDER_SITE_OTHER): Payer: BC Managed Care – PPO

## 2011-08-09 ENCOUNTER — Ambulatory Visit (INDEPENDENT_AMBULATORY_CARE_PROVIDER_SITE_OTHER): Payer: BC Managed Care – PPO | Admitting: Internal Medicine

## 2011-08-09 ENCOUNTER — Encounter: Payer: Self-pay | Admitting: Internal Medicine

## 2011-08-09 ENCOUNTER — Telehealth: Payer: Self-pay | Admitting: Internal Medicine

## 2011-08-09 VITALS — BP 110/62 | HR 80 | Temp 98.1°F | Resp 16 | Wt 217.0 lb

## 2011-08-09 DIAGNOSIS — L259 Unspecified contact dermatitis, unspecified cause: Secondary | ICD-10-CM

## 2011-08-09 DIAGNOSIS — M545 Low back pain: Secondary | ICD-10-CM

## 2011-08-09 DIAGNOSIS — R739 Hyperglycemia, unspecified: Secondary | ICD-10-CM

## 2011-08-09 DIAGNOSIS — R7309 Other abnormal glucose: Secondary | ICD-10-CM

## 2011-08-09 DIAGNOSIS — R5381 Other malaise: Secondary | ICD-10-CM

## 2011-08-09 DIAGNOSIS — I10 Essential (primary) hypertension: Secondary | ICD-10-CM

## 2011-08-09 DIAGNOSIS — R5383 Other fatigue: Secondary | ICD-10-CM

## 2011-08-09 LAB — COMPREHENSIVE METABOLIC PANEL
ALT: 17 U/L (ref 0–53)
AST: 32 U/L (ref 0–37)
Albumin: 4.3 g/dL (ref 3.5–5.2)
Calcium: 9.2 mg/dL (ref 8.4–10.5)
Chloride: 104 mEq/L (ref 96–112)
Potassium: 4.5 mEq/L (ref 3.5–5.1)
Sodium: 140 mEq/L (ref 135–145)
Total Protein: 7.2 g/dL (ref 6.0–8.3)

## 2011-08-09 LAB — TSH: TSH: 1.82 u[IU]/mL (ref 0.35–5.50)

## 2011-08-09 MED ORDER — TRIAMCINOLONE ACETONIDE 0.5 % EX OINT: TOPICAL_OINTMENT | Freq: Two times a day (BID) | CUTANEOUS | Status: DC

## 2011-08-09 MED ORDER — MELOXICAM 15 MG PO TABS: 15.0000 mg | ORAL_TABLET | Freq: Every day | ORAL | Status: DC | PRN

## 2011-08-09 NOTE — Telephone Encounter (Signed)
Patient informed. 

## 2011-08-09 NOTE — Assessment & Plan Note (Signed)
Treat DM if it is an issue Check labs

## 2011-08-09 NOTE — Telephone Encounter (Signed)
[name], please, inform patient that all labs are normal. No diabetes Thx

## 2011-08-09 NOTE — Progress Notes (Signed)
  Subjective:    Patient ID: [name], male    DOB: Sep 21, 1946, 65 y.o.   MRN: [id number]  HPI  C/o itchy rash x 1 wk - still spreading... C/oR shoulder pain at night C/o fatigue after lunch C/o L foot pain in am - first steps hurt the most  Review of Systems  Constitutional: Positive for fatigue. Negative for appetite change and unexpected weight change.  HENT: Negative for nosebleeds, congestion, sore throat, sneezing, trouble swallowing and neck pain.   Eyes: Negative for itching and visual disturbance.  Respiratory: Negative for cough.   Cardiovascular: Negative for chest pain, palpitations and leg swelling.  Gastrointestinal: Negative for nausea, diarrhea, blood in stool and abdominal distention.  Genitourinary: Negative for frequency and hematuria.  Musculoskeletal: Positive for gait problem. Negative for back pain and joint swelling. Arthralgias: L heel pain and R shoulder pain   Skin: Negative for rash.  Neurological: Negative for dizziness, tremors, speech difficulty and weakness.  Psychiatric/Behavioral: Negative for sleep disturbance, dysphoric mood and agitation. The patient is not nervous/anxious.        Objective:   Physical Exam  Constitutional: He is oriented to person, place, and time. He appears well-developed.  HENT:  Mouth/Throat: Oropharynx is clear and moist.  Eyes: Conjunctivae are normal. Pupils are equal, round, and reactive to light.  Neck: Normal range of motion. No JVD present. No thyromegaly present.  Cardiovascular: Normal rate, regular rhythm, normal heart sounds and intact distal pulses.  Exam reveals no gallop and no friction rub.   No murmur heard. Pulmonary/Chest: Effort normal and breath sounds normal. No respiratory distress. He has no wheezes. He has no rales. He exhibits no tenderness.  Abdominal: Soft. Bowel sounds are normal. He exhibits no distension and no mass. There is no tenderness. There is no rebound and no guarding.    Musculoskeletal: Normal range of motion. He exhibits tenderness (L heel and R shoulder are tender). He exhibits no edema.  Lymphadenopathy:    He has no cervical adenopathy.  Neurological: He is alert and oriented to person, place, and time. He has normal reflexes. No cranial nerve deficit. He exhibits normal muscle tone. Coordination normal.  Skin: Skin is warm and dry. No rash noted.  Psychiatric: He has a normal mood and affect. His behavior is normal. Judgment and thought content normal.          Assessment & Plan:

## 2011-08-12 ENCOUNTER — Encounter: Payer: Self-pay | Admitting: Internal Medicine

## 2011-08-12 NOTE — Assessment & Plan Note (Signed)
Continue with current prescription therapy as reflected on the Med list.  

## 2011-08-12 NOTE — Assessment & Plan Note (Signed)
Continue with current prescription therapy as reflected on the Med list. BP Readings from Last 3 Encounters:  08/09/11 110/62  07/17/11 108/78  03/18/11 128/98

## 2011-09-09 ENCOUNTER — Ambulatory Visit: Payer: BC Managed Care – PPO | Admitting: Radiation Oncology

## 2011-09-18 ENCOUNTER — Ambulatory Visit
Admission: RE | Admit: 2011-09-18 | Discharge: 2011-09-18 | Disposition: A | Discharge status: Home / Self Care | Payer: BC Managed Care – PPO | Source: Ambulatory Visit | Attending: Radiation Oncology | Admitting: Radiation Oncology

## 2011-09-18 DIAGNOSIS — Z79899 Other long term (current) drug therapy: Secondary | ICD-10-CM | POA: Insufficient documentation

## 2011-09-18 DIAGNOSIS — I10 Essential (primary) hypertension: Secondary | ICD-10-CM | POA: Insufficient documentation

## 2011-09-18 DIAGNOSIS — Z510 Encounter for antineoplastic radiation therapy: Secondary | ICD-10-CM | POA: Insufficient documentation

## 2011-09-18 DIAGNOSIS — I2510 Atherosclerotic heart disease of native coronary artery without angina pectoris: Secondary | ICD-10-CM | POA: Insufficient documentation

## 2011-09-18 DIAGNOSIS — C61 Malignant neoplasm of prostate: Secondary | ICD-10-CM | POA: Insufficient documentation

## 2011-09-18 DIAGNOSIS — I252 Old myocardial infarction: Secondary | ICD-10-CM | POA: Insufficient documentation

## 2011-09-18 DIAGNOSIS — E7800 Pure hypercholesterolemia, unspecified: Secondary | ICD-10-CM | POA: Insufficient documentation

## 2011-10-02 ENCOUNTER — Ambulatory Visit
Admission: RE | Admit: 2011-10-02 | Discharge: 2011-10-02 | Disposition: A | Discharge status: Home / Self Care | Payer: BC Managed Care – PPO | Source: Ambulatory Visit | Attending: Radiation Oncology | Admitting: Radiation Oncology

## 2011-10-02 VITALS — Wt 219.1 lb

## 2011-10-02 DIAGNOSIS — C039 Malignant neoplasm of gum, unspecified: Secondary | ICD-10-CM

## 2011-10-02 DIAGNOSIS — Z8546 Personal history of malignant neoplasm of prostate: Secondary | ICD-10-CM

## 2011-10-02 NOTE — Progress Notes (Signed)
[facility] Radiation Oncology Weekly Treatment Note    Name: [name] Date: 10/02/2011 MRN: [id number] DOB: 05-13-1946  Status:outpatient    Current dose: 180   Current fraction:1  Planned dose:6480 cGy  Planned fraction:38    NARRATIVE: [name] was seen today for weekly treatment management. The chart was checked and MVCT images were reviewed.  PHYSICAL EXAMINATION: weight is 219 lb 1.6 oz (99.383 kg).    Lungs clear heart rrr, abd soft nl bowel sounds    ASSESSMENT: Patient tolerating treatments well.   PLAN: Continue treatment as planned.

## 2011-10-02 NOTE — Progress Notes (Signed)
No dysuria, post simmed, radiation therapy and you book given to pt ,s/s, s/e  To report to staff,pt gave verbal understanding 3:26 PM

## 2011-10-03 ENCOUNTER — Ambulatory Visit
Admission: RE | Admit: 2011-10-03 | Discharge: 2011-10-03 | Disposition: A | Discharge status: Home / Self Care | Payer: BC Managed Care – PPO | Source: Ambulatory Visit | Attending: Radiation Oncology | Admitting: Radiation Oncology

## 2011-10-04 ENCOUNTER — Ambulatory Visit
Admission: RE | Admit: 2011-10-04 | Discharge: 2011-10-04 | Disposition: A | Discharge status: Home / Self Care | Payer: BC Managed Care – PPO | Source: Ambulatory Visit | Attending: Radiation Oncology | Admitting: Radiation Oncology

## 2011-10-07 ENCOUNTER — Ambulatory Visit
Admission: RE | Admit: 2011-10-07 | Discharge: 2011-10-07 | Disposition: A | Discharge status: Home / Self Care | Payer: BC Managed Care – PPO | Source: Ambulatory Visit | Attending: Radiation Oncology | Admitting: Radiation Oncology

## 2011-10-07 ENCOUNTER — Other Ambulatory Visit: Payer: Self-pay | Admitting: Cardiology

## 2011-10-08 ENCOUNTER — Ambulatory Visit
Admission: RE | Admit: 2011-10-08 | Discharge: 2011-10-08 | Disposition: A | Discharge status: Home / Self Care | Payer: BC Managed Care – PPO | Source: Ambulatory Visit | Attending: Radiation Oncology | Admitting: Radiation Oncology

## 2011-10-08 DIAGNOSIS — Z8546 Personal history of malignant neoplasm of prostate: Secondary | ICD-10-CM

## 2011-10-08 NOTE — Progress Notes (Signed)
CC:   [name]. [name], M.D. Bryan Quint. Plotnikov, MD  DIAGNOSIS:  Recurrent prostate cancer.  NARRATIVE:  [name] is seen today for weekly assessment.  He has completed 4/38 planned treatments directed at the lower pelvis area (720 cGy of a planned 6840 cGy).  The patient is tolerating his treatments well at this time without any side effects.  PHYSICAL EXAMINATION:  General:  The patient's weight is 217.4 pounds. Lungs:  The lungs are clear.  Heart:  The heart has a regular rhythm and rate.  Abdomen:  The abdomen is soft and nontender with normal bowel sounds.  IMPRESSION AND PLAN:  The patient is tolerating his radiation treatments well at this time.  The patient's radiation fields are setting up accurately.  The patient's radiation chart was checked today.  The plan is to continue with salvage radiation therapy to a cumulative dose of 6840 cGy.    ______________________________ [name], Ph.D., M.D. JDK/MEDQ  D:  10/08/2011  T:  10/08/2011  Job:  [id number]

## 2011-10-08 NOTE — Progress Notes (Signed)
No c/o , no changes, still eqting well, and drinking plenty fluids, slight tingling across lower abdomen when getting rad tx and sometimes at night lying in bed, no pain,resolves quickly

## 2011-10-09 ENCOUNTER — Ambulatory Visit
Admission: RE | Admit: 2011-10-09 | Discharge: 2011-10-09 | Disposition: A | Discharge status: Home / Self Care | Payer: BC Managed Care – PPO | Source: Ambulatory Visit | Attending: Radiation Oncology | Admitting: Radiation Oncology

## 2011-10-10 ENCOUNTER — Ambulatory Visit
Admission: RE | Admit: 2011-10-10 | Discharge: 2011-10-10 | Disposition: A | Discharge status: Home / Self Care | Payer: BC Managed Care – PPO | Source: Ambulatory Visit | Attending: Radiation Oncology | Admitting: Radiation Oncology

## 2011-10-11 ENCOUNTER — Ambulatory Visit (INDEPENDENT_AMBULATORY_CARE_PROVIDER_SITE_OTHER): Payer: BC Managed Care – PPO | Admitting: Internal Medicine

## 2011-10-11 ENCOUNTER — Ambulatory Visit
Admission: RE | Admit: 2011-10-11 | Discharge: 2011-10-11 | Disposition: A | Discharge status: Home / Self Care | Payer: BC Managed Care – PPO | Source: Ambulatory Visit | Attending: Radiation Oncology | Admitting: Radiation Oncology

## 2011-10-11 ENCOUNTER — Encounter: Payer: Self-pay | Admitting: Internal Medicine

## 2011-10-11 DIAGNOSIS — M545 Low back pain, unspecified: Secondary | ICD-10-CM

## 2011-10-11 DIAGNOSIS — R739 Hyperglycemia, unspecified: Secondary | ICD-10-CM

## 2011-10-11 DIAGNOSIS — R7309 Other abnormal glucose: Secondary | ICD-10-CM

## 2011-10-11 DIAGNOSIS — M109 Gout, unspecified: Secondary | ICD-10-CM

## 2011-10-11 DIAGNOSIS — I10 Essential (primary) hypertension: Secondary | ICD-10-CM

## 2011-10-11 NOTE — Assessment & Plan Note (Signed)
Watching wt Wt Readings from Last 3 Encounters:  10/11/11 216 lb (97.977 kg)  10/08/11 217 lb 6.4 oz (98.612 kg)  10/02/11 219 lb 1.6 oz (99.383 kg)

## 2011-10-11 NOTE — Assessment & Plan Note (Signed)
Continue with current prescription therapy as reflected on the Med list.  

## 2011-10-11 NOTE — Assessment & Plan Note (Signed)
Rx prn 

## 2011-10-11 NOTE — Progress Notes (Signed)
Subjective:    Patient ID: [name], male    DOB: 1946/09/15, 65 y.o.   MRN: [id number]  HPI  The patient presents for a follow-up of  chronic hypertension, chronic dyslipidemia, OA, CAD controlled with medicines. He started XRT    Review of Systems  Constitutional: Negative for appetite change, fatigue and unexpected weight change.  HENT: Negative for nosebleeds, congestion, sore throat, sneezing, trouble swallowing and neck pain.   Eyes: Negative for itching and visual disturbance.  Respiratory: Negative for cough.   Cardiovascular: Negative for chest pain, palpitations and leg swelling.  Gastrointestinal: Negative for nausea, diarrhea, blood in stool and abdominal distention.  Genitourinary: Negative for frequency and hematuria.  Musculoskeletal: Negative for back pain, joint swelling and gait problem.  Skin: Negative for rash.  Neurological: Negative for dizziness, tremors, speech difficulty and weakness.  Psychiatric/Behavioral: Negative for sleep disturbance, dysphoric mood and agitation. The patient is not nervous/anxious.        Objective:   Physical Exam  Constitutional: He is oriented to person, place, and time. He appears well-developed.       obese  HENT:  Mouth/Throat: Oropharynx is clear and moist.  Eyes: Conjunctivae are normal. Pupils are equal, round, and reactive to light.  Neck: Normal range of motion. No JVD present. No thyromegaly present.  Cardiovascular: Normal rate, regular rhythm, normal heart sounds and intact distal pulses.  Exam reveals no gallop and no friction rub.   No murmur heard. Pulmonary/Chest: Effort normal and breath sounds normal. No respiratory distress. He has no wheezes. He has no rales. He exhibits no tenderness.  Abdominal: Soft. Bowel sounds are normal. He exhibits no distension and no mass. There is no tenderness. There is no rebound and no guarding.  Musculoskeletal: Normal range of motion. He exhibits no edema and no  tenderness.  Lymphadenopathy:    He has no cervical adenopathy.  Neurological: He is alert and oriented to person, place, and time. He has normal reflexes. No cranial nerve deficit. He exhibits normal muscle tone. Coordination normal.  Skin: Skin is warm and dry. No rash noted.  Psychiatric: He has a normal mood and affect. His behavior is normal. Judgment and thought content normal.    Lab Results  Component Value Date   WBC 6.8 01/23/2010   HGB 15.6 01/23/2010   HCT 45.5 01/23/2010   PLT PLATELET CLUMPS NOTED ON SMEAR, COUNT APPEARS DECREASED 01/23/2010   GLUCOSE 98 08/09/2011   CHOL  Value: 106        ATP III CLASSIFICATION:  <200     mg/dL   Desirable  401-027  mg/dL   Borderline High  >=253    mg/dL   High        6/64/4034   TRIG 62 01/20/2010   HDL 57 01/20/2010   LDLCALC  Value: 37        Total Cholesterol/HDL:CHD Risk Coronary Heart Disease Risk Table                     Men   Women  1/2 Average Risk   3.4   3.3  Average Risk       5.0   4.4  2 X Average Risk   9.6   7.1  3 X Average Risk  23.4   11.0        Use the calculated Patient Ratio above and the CHD Risk Table to determine the patient's CHD Risk.  ATP III CLASSIFICATION (LDL):  <100     mg/dL   Optimal  914-782  mg/dL   Near or Above                    Optimal  130-159  mg/dL   Borderline  956-213  mg/dL   High  >086     mg/dL   Very High 5/78/4696   ALT 17 08/09/2011   AST 32 08/09/2011   NA 140 08/09/2011   K 4.5 08/09/2011   CL 104 08/09/2011   CREATININE 1.0 08/09/2011   BUN 18 08/09/2011   CO2 28 08/09/2011   TSH 1.82 08/09/2011   INR 1.12 01/19/2010   HGBA1C 5.8 08/09/2011         Assessment & Plan:

## 2011-10-12 ENCOUNTER — Ambulatory Visit
Admission: RE | Admit: 2011-10-12 | Discharge: 2011-10-12 | Disposition: A | Discharge status: Home / Self Care | Payer: BC Managed Care – PPO | Source: Ambulatory Visit | Attending: Radiation Oncology | Admitting: Radiation Oncology

## 2011-10-14 ENCOUNTER — Ambulatory Visit
Admission: RE | Admit: 2011-10-14 | Discharge: 2011-10-14 | Disposition: A | Discharge status: Home / Self Care | Payer: BC Managed Care – PPO | Source: Ambulatory Visit | Attending: Radiation Oncology | Admitting: Radiation Oncology

## 2011-10-15 ENCOUNTER — Ambulatory Visit
Admission: RE | Admit: 2011-10-15 | Discharge: 2011-10-15 | Disposition: A | Discharge status: Home / Self Care | Payer: BC Managed Care – PPO | Source: Ambulatory Visit | Attending: Radiation Oncology | Admitting: Radiation Oncology

## 2011-10-15 VITALS — Wt 218.2 lb

## 2011-10-15 DIAGNOSIS — Z8546 Personal history of malignant neoplasm of prostate: Secondary | ICD-10-CM

## 2011-10-15 NOTE — Progress Notes (Signed)
No c/o , slight dysuria, increassed voiding  A little more often stated pt, also drinking more water 4:29 PM

## 2011-10-15 NOTE — Progress Notes (Signed)
DIAGNOSIS:  Recurrent prostate cancer.  NARRATIVE:  [name] is seen today for weekly assessment.  He has completed 1980 cGy of a planned 6840 cGy directed at the lower pelvis region.  The patient has noticed some slight dysuria and some mild fatigue, but otherwise is tolerating his treatments well.  PHYSICAL EXAMINATION:  Vital signs: The patient's weight is 218.2 pounds.  The abdomen is soft and nontender with normal bowel sounds. The heart has a regular rhythm and rate.  The patient is accompanied by his wife on evaluation today.  IMPRESSION AND PLAN:  The patient is tolerating his radiation treatments well at this time, except for issues as above.  The patient's radiation fields are setting up accurately.  The patient's radiation chart was checked today.  Plan is to continue to an accumulative dose of 6840 cGy as salvage therapy for recurrent prostate cancer.    ______________________________ [name], Ph.D., M.D. JDK/MEDQ  D:  10/15/2011  T:  10/15/2011  Job:  [id number]

## 2011-10-16 ENCOUNTER — Ambulatory Visit
Admission: RE | Admit: 2011-10-16 | Discharge: 2011-10-16 | Disposition: A | Discharge status: Home / Self Care | Payer: BC Managed Care – PPO | Source: Ambulatory Visit | Attending: Radiation Oncology | Admitting: Radiation Oncology

## 2011-10-17 ENCOUNTER — Other Ambulatory Visit: Payer: Self-pay | Admitting: Cardiology

## 2011-10-21 ENCOUNTER — Ambulatory Visit
Admission: RE | Admit: 2011-10-21 | Discharge: 2011-10-21 | Disposition: A | Discharge status: Home / Self Care | Payer: BC Managed Care – PPO | Source: Ambulatory Visit | Attending: Radiation Oncology | Admitting: Radiation Oncology

## 2011-10-22 ENCOUNTER — Ambulatory Visit
Admission: RE | Admit: 2011-10-22 | Discharge: 2011-10-22 | Disposition: A | Discharge status: Home / Self Care | Payer: BC Managed Care – PPO | Source: Ambulatory Visit | Attending: Radiation Oncology | Admitting: Radiation Oncology

## 2011-10-23 ENCOUNTER — Ambulatory Visit
Admission: RE | Admit: 2011-10-23 | Discharge: 2011-10-23 | Disposition: A | Discharge status: Home / Self Care | Payer: BC Managed Care – PPO | Source: Ambulatory Visit | Attending: Radiation Oncology | Admitting: Radiation Oncology

## 2011-10-24 ENCOUNTER — Ambulatory Visit
Admission: RE | Admit: 2011-10-24 | Discharge: 2011-10-24 | Disposition: A | Discharge status: Home / Self Care | Payer: BC Managed Care – PPO | Source: Ambulatory Visit | Attending: Radiation Oncology | Admitting: Radiation Oncology

## 2011-10-25 ENCOUNTER — Ambulatory Visit
Admission: RE | Admit: 2011-10-25 | Discharge: 2011-10-25 | Disposition: A | Discharge status: Home / Self Care | Payer: BC Managed Care – PPO | Source: Ambulatory Visit | Attending: Radiation Oncology | Admitting: Radiation Oncology

## 2011-10-25 VITALS — Wt 216.2 lb

## 2011-10-25 DIAGNOSIS — Z8546 Personal history of malignant neoplasm of prostate: Secondary | ICD-10-CM

## 2011-10-25 NOTE — Progress Notes (Signed)
Occasional mild dysuria, no other c/o but mild fatigue at times 4:37 PM

## 2011-10-28 ENCOUNTER — Ambulatory Visit
Admission: RE | Admit: 2011-10-28 | Discharge: 2011-10-28 | Disposition: A | Discharge status: Home / Self Care | Payer: BC Managed Care – PPO | Source: Ambulatory Visit | Attending: Radiation Oncology | Admitting: Radiation Oncology

## 2011-10-29 ENCOUNTER — Ambulatory Visit
Admission: RE | Admit: 2011-10-29 | Discharge: 2011-10-29 | Disposition: A | Discharge status: Home / Self Care | Payer: BC Managed Care – PPO | Source: Ambulatory Visit | Attending: Radiation Oncology | Admitting: Radiation Oncology

## 2011-10-29 ENCOUNTER — Encounter: Payer: Self-pay | Admitting: Radiation Oncology

## 2011-10-29 DIAGNOSIS — Z8546 Personal history of malignant neoplasm of prostate: Secondary | ICD-10-CM

## 2011-10-29 NOTE — Progress Notes (Signed)
pt doing well, "i get on that table and almost go asleep"no dysuria though, just slight more fatigued 4:28 PM

## 2011-10-30 ENCOUNTER — Ambulatory Visit
Admission: RE | Admit: 2011-10-30 | Discharge: 2011-10-30 | Disposition: A | Discharge status: Home / Self Care | Payer: BC Managed Care – PPO | Source: Ambulatory Visit | Attending: Radiation Oncology | Admitting: Radiation Oncology

## 2011-10-30 NOTE — Progress Notes (Signed)
DIAGNOSIS:  Recurrent prostate cancer.  NARRATIVE:  [name] is seen today for weekly assessment.  He has completed 17/38 planned treatments directed at the central lower pelvis area (3060 cGy of a planned 6840 cGy.  The patient has noticed slight increase in urinary frequency but no real dysuria or bowel complaints. He has minimal fatigue at this time.  EXAMINATION:  Vital Signs:  The patient's weight is 216.3 pounds. Examination of lungs reveals them to be clear.  Heart:  Regular rhythm and rate.  Abdomen:  Soft and nontender with normal bowel sounds.  IMPRESSION AND PLAN:  The patient is tolerating his radiation treatments well at this time.  The patient's radiation fields are setting up accurately.  The patient's radiation chart was checked today.  Plan is to continue with radiation is planned to a cumulative dose of 6840 as salvage treatment.    ______________________________ Billie Lade, Ph.D., M.D. JDK/MEDQ  D:  10/25/2011  T:  10/29/2011  Job:  [id number]

## 2011-10-30 NOTE — Progress Notes (Signed)
DIAGNOSIS:  Recurrent prostate cancer.  NARRATIVE:  [name] is seen today for weekly assessment.  He has completed 19 of 38 planned treatments directed at the central lower pelvis area (3420 cGy of a planned 6840 cGy).  The patient does have some fatigue at this time.  He denies any further problems with dysuria. The patient denies any bowel problems at this time.  The patient continues to work his usual schedule, however.  On examination, the patient's weight is 217 pounds which is stable.  Examination of the lungs reveals them to be clear.  The heart has a regular rhythm and rate.  The abdomen is soft and nontender with normal bowel sounds.  IMPRESSION/PLAN:  The patient is tolerating his radiation treatments well at this time.  The patient's radiation fields are setting up accurately.  The patient does undergo daily MV CT scanning for accurate placement.  Patient's radiation chart was checked today.  Plan is to continue to a cumulative dose of 6840 cGy.    ______________________________ Billie Lade, Ph.D., M.D. JDK/MEDQ  D:  10/29/2011  T:  10/30/2011  Job:  [id number]

## 2011-10-31 ENCOUNTER — Ambulatory Visit
Admission: RE | Admit: 2011-10-31 | Discharge: 2011-10-31 | Disposition: A | Discharge status: Home / Self Care | Payer: BC Managed Care – PPO | Source: Ambulatory Visit | Attending: Radiation Oncology | Admitting: Radiation Oncology

## 2011-11-01 ENCOUNTER — Ambulatory Visit
Admission: RE | Admit: 2011-11-01 | Discharge: 2011-11-01 | Disposition: A | Discharge status: Home / Self Care | Payer: BC Managed Care – PPO | Source: Ambulatory Visit | Attending: Radiation Oncology | Admitting: Radiation Oncology

## 2011-11-04 ENCOUNTER — Ambulatory Visit
Admission: RE | Admit: 2011-11-04 | Discharge: 2011-11-04 | Disposition: A | Discharge status: Home / Self Care | Payer: BC Managed Care – PPO | Source: Ambulatory Visit | Attending: Radiation Oncology | Admitting: Radiation Oncology

## 2011-11-05 ENCOUNTER — Ambulatory Visit
Admission: RE | Admit: 2011-11-05 | Discharge: 2011-11-05 | Disposition: A | Discharge status: Home / Self Care | Payer: BC Managed Care – PPO | Source: Ambulatory Visit | Attending: Radiation Oncology | Admitting: Radiation Oncology

## 2011-11-05 VITALS — Wt 219.1 lb

## 2011-11-05 DIAGNOSIS — C61 Malignant neoplasm of prostate: Secondary | ICD-10-CM

## 2011-11-05 NOTE — Progress Notes (Signed)
Pt  Still has some fatigue ,mor frequency voiding, just starting to burn a few days and then stops, 4:15 PM

## 2011-11-05 NOTE — Progress Notes (Signed)
[facility]    Radiation Oncology [address] [place]     Maryln Gottron, M.D. [place], [place] [address]               Billie Lade, M.D., Ph.D. Phone: [phone]      Molli Hazard A. Kathrynn Running, M.D. Fax: [phone]      Radene Gunning, M.D., Ph.D.         Lurline Hare, M.D.         Grayland Jack, M.D Weekly Treatment Management Note  Name: [name]     MRN: [id number]        CSN: [id number] Date: 11/05/2011      DOB: 12-01-1945  CC: Bryan Primes, MD, MD         Plotnikov    Status: Outpatient  Diagnosis: The encounter diagnosis was recurrent prostate cancer.  Current Dose: 4320  Current Fraction: 24  Planned Dose: 6840 cGy  Narrative: [name] was seen today for weekly treatment management. The chart was checked and  mvct's  were reviewed. Pt. Having some fatigue and urinary  Urgency, intermittent dysuria.  Review of patient's allergies indicates no known allergies. Current Outpatient Prescriptions  Medication Sig Dispense Refill  . allopurinol (ZYLOPRIM) 300 MG tablet Take 1 tablet (300 mg total) by mouth daily.  30 tablet  0  . aspirin 325 MG tablet Take 325 mg by mouth daily.        . Cholecalciferol (VITAMIN D3) 1000 UNITS CAPS Take by mouth daily.        [name] Kitchen ezetimibe (ZETIA) 10 MG tablet Take 10 mg by mouth daily.        [name] Kitchen HYDROcodone-acetaminophen (NORCO) 5-325 MG per tablet Take 1 tablet by mouth. 1-2 by mouth two times a day as needed for pain       . meloxicam (MOBIC) 15 MG tablet Take 1 tablet (15 mg total) by mouth daily as needed for pain.  90 tablet  3  . metoprolol tartrate (LOPRESSOR) 25 MG tablet Take 25 mg by mouth 2 (two) times daily.        . Multiple Vitamins-Minerals (CENTRUM PO) Take by mouth daily.        . nitroGLYCERIN (NITROSTAT) 0.4 MG SL tablet Place 0.4 mg under the tongue every 5 (five) minutes as needed.        . Omega-3 Fatty Acids (FISH OIL) 1000 MG CAPS Take by mouth daily.        . perindopril (ACEON)  8 MG tablet TAKE 1 TABLET EVERY DAY  30 tablet  5  . pravastatin (PRAVACHOL) 80 MG tablet TAKE 1 TABLET AT BEDTIME  30 tablet  5  . triamcinolone (KENALOG) 0.5 % ointment Apply topically 2 (two) times daily. For rash  120 g  1   Labs:  Lab Results  Component Value Date   WBC 6.8 01/23/2010   HGB 15.6 01/23/2010   HCT 45.5 01/23/2010   MCV 94.0 01/23/2010   PLT PLATELET CLUMPS NOTED ON SMEAR, COUNT APPEARS DECREASED 01/23/2010   Lab Results  Component Value Date   CREATININE 1.0 08/09/2011   BUN 18 08/09/2011   NA 140 08/09/2011   K 4.5 08/09/2011   CL 104 08/09/2011   CO2 28 08/09/2011   Lab Results  Component Value Date   ALT 17 08/09/2011   AST 32 08/09/2011   BILITOT 1.0 08/09/2011    Physical Examination:  weight is 219 lb 1.6 oz (99.383 kg).  Wt Readings from Last 3 Encounters:  11/05/11 219 lb 1.6 oz (99.383 kg)  10/29/11 217 lb 12.8 oz (98.793 kg)  10/25/11 216 lb 3.2 oz (98.068 kg)     Lungs - Normal respiratory effort, chest expands symmetrically. Lungs are clear to auscultation, no crackles or wheezes.  Heart has regular rhythm and rate  Abdomen is soft and non tender with normal bowel sounds  Assessment:  Patient tolerating treatments well  Plan: Continue treatment per original radiation prescription

## 2011-11-06 ENCOUNTER — Ambulatory Visit
Admission: RE | Admit: 2011-11-06 | Discharge: 2011-11-06 | Disposition: A | Discharge status: Home / Self Care | Payer: BC Managed Care – PPO | Source: Ambulatory Visit | Attending: Radiation Oncology | Admitting: Radiation Oncology

## 2011-11-07 ENCOUNTER — Ambulatory Visit
Admission: RE | Admit: 2011-11-07 | Discharge: 2011-11-07 | Disposition: A | Discharge status: Home / Self Care | Payer: BC Managed Care – PPO | Source: Ambulatory Visit | Attending: Radiation Oncology | Admitting: Radiation Oncology

## 2011-11-08 ENCOUNTER — Ambulatory Visit
Admission: RE | Admit: 2011-11-08 | Discharge: 2011-11-08 | Disposition: A | Discharge status: Home / Self Care | Payer: BC Managed Care – PPO | Source: Ambulatory Visit | Attending: Radiation Oncology | Admitting: Radiation Oncology

## 2011-11-11 ENCOUNTER — Ambulatory Visit
Admission: RE | Admit: 2011-11-11 | Discharge: 2011-11-11 | Disposition: A | Discharge status: Home / Self Care | Payer: BC Managed Care – PPO | Source: Ambulatory Visit | Attending: Radiation Oncology | Admitting: Radiation Oncology

## 2011-11-12 ENCOUNTER — Ambulatory Visit
Admission: RE | Admit: 2011-11-12 | Discharge: 2011-11-12 | Disposition: A | Discharge status: Home / Self Care | Payer: BC Managed Care – PPO | Source: Ambulatory Visit | Attending: Radiation Oncology | Admitting: Radiation Oncology

## 2011-11-12 DIAGNOSIS — C61 Malignant neoplasm of prostate: Secondary | ICD-10-CM

## 2011-11-12 DIAGNOSIS — Z8546 Personal history of malignant neoplasm of prostate: Secondary | ICD-10-CM

## 2011-11-12 NOTE — Progress Notes (Signed)
DIAGNOSIS:  Recurrent prostate cancer.  NARRATIVE:  Mr. Ran is seen today for weekly assessment.  He has completed 5220 cGy of a planned 6840 cGy directed at the central lower pelvis area.  The patient continues to tolerate his treatments quite well without any appreciable side effects.  He continues to work his usual schedule.  PHYSICAL EXAMINATION:  Vital Signs:  The patient's weight is 218 which is stable.  Lungs:  The lungs are clear.  Heart:  The heart has a regular rhythm and rate.  Abdomen:  Soft and nontender with normal bowel sounds.  IMPRESSION AND PLAN:  The patient is tolerating his radiation treatments well at this time.  The patient's radiation fields are setting up accurately.  The patient's radiation chart was checked today.  The plan is to continue with image-guided helical IMRT to a cumulative dose of 6840 cGy.    ______________________________ Billie Lade, Ph.D., M.D. JDK/MEDQ  D:  11/12/2011  T:  11/12/2011  Job:  [id number]

## 2011-11-13 ENCOUNTER — Ambulatory Visit
Admission: RE | Admit: 2011-11-13 | Discharge: 2011-11-13 | Disposition: A | Discharge status: Home / Self Care | Payer: BC Managed Care – PPO | Source: Ambulatory Visit | Attending: Radiation Oncology | Admitting: Radiation Oncology

## 2011-11-14 ENCOUNTER — Ambulatory Visit
Admission: RE | Admit: 2011-11-14 | Discharge: 2011-11-14 | Disposition: A | Discharge status: Home / Self Care | Payer: BC Managed Care – PPO | Source: Ambulatory Visit | Attending: Radiation Oncology | Admitting: Radiation Oncology

## 2011-11-15 ENCOUNTER — Ambulatory Visit
Admission: RE | Admit: 2011-11-15 | Discharge: 2011-11-15 | Disposition: A | Discharge status: Home / Self Care | Payer: BC Managed Care – PPO | Source: Ambulatory Visit | Attending: Radiation Oncology | Admitting: Radiation Oncology

## 2011-11-18 ENCOUNTER — Ambulatory Visit
Admission: RE | Admit: 2011-11-18 | Discharge: 2011-11-18 | Disposition: A | Discharge status: Home / Self Care | Payer: BC Managed Care – PPO | Source: Ambulatory Visit | Attending: Radiation Oncology | Admitting: Radiation Oncology

## 2011-11-20 ENCOUNTER — Encounter: Payer: Self-pay | Admitting: Radiation Oncology

## 2011-11-20 ENCOUNTER — Ambulatory Visit
Admission: RE | Admit: 2011-11-20 | Discharge: 2011-11-20 | Disposition: A | Discharge status: Home / Self Care | Payer: BC Managed Care – PPO | Source: Ambulatory Visit | Attending: Radiation Oncology | Admitting: Radiation Oncology

## 2011-11-20 DIAGNOSIS — C61 Malignant neoplasm of prostate: Secondary | ICD-10-CM

## 2011-11-20 NOTE — Progress Notes (Addendum)
Weekly Management Note Current Dose: 61.2  Gy  Projected Dose: 68.4 Gy   Narrative:  The patient presents for routine under treatment assessment.  CBCT/MVCT images/Port film x-rays were reviewed.  The chart was checked. No complaints. Some soft stools but no diarrhea. BID treatment Friday approved by Dr. Roselind Messier.   Physical Findings: Weight: 215 lb 8 oz (97.75 kg). Unchanged  Impression:  The patient is tolerating radiation.  Plan:  Continue treatment as planned. Finishes Monday.

## 2011-11-20 NOTE — Progress Notes (Signed)
Patient presents to the clinic today unaccompanied for under treat visit with Dr. Michell Heinrich. Patient is alert and oriented to person, place, and time. No distress noted. Steady gait noted. Pleasant affect noted. Patient reports that he has been fighting a cold for four days now. Patient taking tussin to relieve cough. Patient reports that he had a low grade fever last night. Scar on left cheek patient reports is from running into her wife's car door.

## 2011-11-21 ENCOUNTER — Ambulatory Visit
Admission: RE | Admit: 2011-11-21 | Discharge: 2011-11-21 | Disposition: A | Discharge status: Home / Self Care | Payer: BC Managed Care – PPO | Source: Ambulatory Visit | Attending: Radiation Oncology | Admitting: Radiation Oncology

## 2011-11-22 ENCOUNTER — Ambulatory Visit
Admission: RE | Admit: 2011-11-22 | Discharge: 2011-11-22 | Disposition: A | Discharge status: Home / Self Care | Payer: BC Managed Care – PPO | Source: Ambulatory Visit | Attending: Radiation Oncology | Admitting: Radiation Oncology

## 2011-11-22 ENCOUNTER — Encounter: Payer: Self-pay | Admitting: Internal Medicine

## 2011-11-22 ENCOUNTER — Ambulatory Visit (INDEPENDENT_AMBULATORY_CARE_PROVIDER_SITE_OTHER): Payer: BC Managed Care – PPO | Admitting: Internal Medicine

## 2011-11-22 VITALS — BP 120/82 | HR 88 | Temp 98.3°F | Resp 16 | Wt 214.0 lb

## 2011-11-22 DIAGNOSIS — J069 Acute upper respiratory infection, unspecified: Secondary | ICD-10-CM | POA: Insufficient documentation

## 2011-11-22 DIAGNOSIS — Z8546 Personal history of malignant neoplasm of prostate: Secondary | ICD-10-CM

## 2011-11-22 MED ORDER — MOXIFLOXACIN HCL 400 MG PO TABS: 400.0000 mg | ORAL_TABLET | Freq: Every day | ORAL | Status: AC

## 2011-11-22 NOTE — Assessment & Plan Note (Signed)
12/12 severe sinusitis He is on XRT x 36/38

## 2011-11-22 NOTE — Patient Instructions (Signed)
Align 1 a day   Use over-the-counter  "cold" medicines  such as "Afrin" nasal spray for nasal congestion as directed instead. Use" Delsym" or" Robitussin" cough syrup varietis for cough.  You can use plain "Tylenol" or "Advi"l for fever, chills and achyness.

## 2011-11-22 NOTE — Assessment & Plan Note (Signed)
12/12  He is on XRT x 36/38 now

## 2011-11-22 NOTE — Progress Notes (Signed)
  Subjective:    Patient ID: [name], male    DOB: 04-21-1946, 65 y.o.   MRN: [id number]  HPI   HPI  C/o URI sx's x  5 days. C/o ST, cough, weakness. Not better with OTC medicines. Actually, the patient is getting worse. The patient did not sleep last night due to cough. Dark yellow d/c  Review of Systems  Constitutional: Positive for fever 102, chills and fatigue.  HENT: Positive for congestion, rhinorrhea, sneezing and postnasal drip.   Eyes: Positive for photophobia and pain. Negative for discharge and visual disturbance.  Respiratory: Positive for cough and wheezing.   Positive for chest pain.  Gastrointestinal: Negative for vomiting, abdominal pain, diarrhea and abdominal distention.  Genitourinary: Negative for dysuria and difficulty urinating.  Skin: Negative for rash.  Neurological: Positive for dizziness, weakness and light-headedness.      Review of Systems  Gastrointestinal: Positive for diarrhea (mild from XRT).       Objective:   Physical Exam  Constitutional: He is oriented to person, place, and time. He appears well-developed and well-nourished.  HENT:  Mouth/Throat: Oropharynx is clear and moist. No oropharyngeal exudate.       Green mucus in B nostrils  Eyes: Conjunctivae are normal. Pupils are equal, round, and reactive to light.  Neck: Normal range of motion. No JVD present. No thyromegaly present.  Cardiovascular: Normal rate, regular rhythm, normal heart sounds and intact distal pulses.  Exam reveals no gallop and no friction rub.   No murmur heard. Pulmonary/Chest: Effort normal and breath sounds normal. No respiratory distress. He has no wheezes. He has no rales. He exhibits no tenderness.  Abdominal: Soft. Bowel sounds are normal. He exhibits no distension and no mass. There is no tenderness. There is no rebound and no guarding.  Musculoskeletal: Normal range of motion. He exhibits no edema and no tenderness.  Lymphadenopathy:    He has no  cervical adenopathy.  Neurological: He is alert and oriented to person, place, and time. He has normal reflexes. No cranial nerve deficit. He exhibits normal muscle tone. Coordination normal.  Skin: Skin is warm and dry. No rash noted.  Psychiatric: He has a normal mood and affect. His behavior is normal. Judgment and thought content normal.          Assessment & Plan:

## 2011-11-25 ENCOUNTER — Ambulatory Visit
Admission: RE | Admit: 2011-11-25 | Discharge: 2011-11-25 | Disposition: A | Discharge status: Home / Self Care | Payer: BC Managed Care – PPO | Source: Ambulatory Visit | Attending: Radiation Oncology | Admitting: Radiation Oncology

## 2011-11-25 DIAGNOSIS — C61 Malignant neoplasm of prostate: Secondary | ICD-10-CM

## 2011-11-25 NOTE — Progress Notes (Signed)
Pt still has sinusitis, taking antibiotic and decongestant, Avelox 400mg  daily for 10 days, started 11/22/11, pt went to see his Primary Md after his rad tx  gave pt 1 month worth samples of align probiotics to take as well,pt states he's doing a lot better, complted tx today, 1 month follow up given.,   10:15 AM

## 2011-11-25 NOTE — Progress Notes (Signed)
DIAGNOSIS:  Recurrent prostate cancer.  NARRATIVE:  [name] is seen today for weekly assessment.  He actually completed his last radiation therapy directed to the lower pelvis area earlier today, totaling 6840 cGy.  The patient has some minimal urinary symptoms, and bowel complaints.  He is bothered by upper respiratory illness.  He has been placed on Avelox by his primary care physician.  The patient is feeling better as far as this issue is concerned.  The patient denies any hematuria or difficulty with bladder retention.  EXAMINATION:  The patient's weight is 210.9 pounds.  The lungs are clear.  The heart has regular rhythm and rate.  Abdomen:  Soft and nontender with normal bowel sounds.  IMPRESSION AND PLAN:  The patient, overall, is tolerating his treatments well.  His radiation fields have set up accurately.  The patient's radiation chart was checked today.  Plan is to followup in Radiation Oncology in 1 month.    ______________________________ Billie Lade, Ph.D., M.D. JDK/MEDQ  D:  11/25/2011  T:  11/25/2011  Job:  [id number]

## 2011-11-26 ENCOUNTER — Ambulatory Visit: Payer: BC Managed Care – PPO

## 2011-11-27 ENCOUNTER — Ambulatory Visit: Payer: BC Managed Care – PPO

## 2011-12-01 NOTE — Progress Notes (Signed)
DIAGNOSIS:  Recurrent prostate cancer.  NARRATIVE:  On September 30, 2011, [name] had completion of his IMRT plan directed at the "prostate bed."  IMRT was chosen over conventional or conformal radiation therapy to more accurately target the area of concern and to limit dose to normal surrounding critical structures, i.e., the bladder and rectum.  Dose volume histograms of the critical structures as well as target volume are reviewed, accepted, and placed in the patient's chart.  The patient will receive 38 treatments for a cumulative dose of 6840 cGy.    ______________________________ Billie Lade, Ph.D., M.D. JDK/MEDQ  D:  12/01/2011  T:  12/01/2011  Job:  [id number]

## 2011-12-02 NOTE — Progress Notes (Signed)
CC:   Excell Seltzer. Annabell Howells, M.D. Georgina Quint. Plotnikov, MD Jesse Sans. Wall, MD, [facility]  DIAGNOSIS:  Recurrent prostate cancer.  INDICATION FOR THERAPY:  Salvage therapy.  TREATMENT DATES:  October 02, 2011 through November 25, 2011.  SITES/DOSE:  Central lower pelvis area, 6840 cGy in 38 fractions, containing 180 cGy per fraction.  ENERGY/FIELD:  The patient was treated with helical IMRT on the tomotherapy unit, using 6 MV photons.  The patient did undergo daily megavoltage CT scanning for accurate setup and treatment.  NARRATIVE:  [name] overall, tolerated his radiation therapy quite well.  Towards the end of his therapy he did experience some mild diarrhea.  The patient also experienced some fatigue but no significant urinary symptoms.  The patient was bothered by upper respiratory illness during the latter portion of his treatment, which was treated with Avelox.  FOLLOW UP APPOINTMENT:  1 month.    ______________________________ Billie Lade, Ph.D., M.D. JDK/MEDQ  D:  12/01/2011  T:  12/01/2011  Job:  [id number]

## 2011-12-10 ENCOUNTER — Other Ambulatory Visit: Payer: Self-pay | Admitting: Cardiology

## 2011-12-30 ENCOUNTER — Ambulatory Visit
Admission: RE | Admit: 2011-12-30 | Discharge: 2011-12-30 | Disposition: A | Discharge status: Home / Self Care | Payer: BC Managed Care – PPO | Source: Ambulatory Visit | Attending: Radiation Oncology | Admitting: Radiation Oncology

## 2011-12-30 VITALS — BP 131/85 | HR 67 | Temp 97.9°F | Wt 220.1 lb

## 2011-12-30 DIAGNOSIS — C61 Malignant neoplasm of prostate: Secondary | ICD-10-CM

## 2011-12-30 NOTE — Progress Notes (Signed)
Here for follow up radiation completion to pelvis 11/25/2011. Denies pain. Did pass kidney stone since completion of treatment. Has no further burniing on urination.

## 2011-12-30 NOTE — Progress Notes (Signed)
CC:   [name]. [name], M.D. Bryan Quint. Plotnikov, MD  DIAGNOSIS:  Recurrent prostate cancer.  INTERVAL SINCE RADIATION THERAPY:  Six weeks.  NARRATIVE:  [name] comes in today for routine followup.  He clinically seems to be doing quite well at this time.  The patient did have some dysuria towards the end of his treatment, but this cleared up after the patient passed a renal calculus.  The patient denied any significant pain with this or significant bleeding.  The patient denies any bowel complaints or further urinary issues.  His energy level is still mildly down and he estimates his strength as 70% to 75% of normal. The patient continues to work his usual full-time job.  PHYSICAL EXAMINATION:  Vital Signs:  The patient's temperature is 97.9, pulse 67, blood pressure is 131/85, weight is 220 pounds, which is stable since mid December.  Lymph:  Examination of the neck and supraclavicular region reveals no evidence of adenopathy.  The axillary areas are free of adenopathy.  Lungs:  Clear.  Heart:  Has a regular rhythm and rate.  Abdomen:  Examination the abdomen reveals it to be soft and nontender with normal bowel sounds.  IMPRESSION AND PLAN:  Clinically stable status post salvage radiation therapy.  The patient will be seeing Dr. Annabell Hayes later this spring for his 1st post treatment PSA and examination.  Routine followup in Radiation Oncology in 6 months.    ______________________________ Billie Lade, Ph.D., M.D. JDK/MEDQ  D:  12/30/2011  T:  12/30/2011  Job:  [id number]

## 2012-03-25 ENCOUNTER — Ambulatory Visit: Payer: BC Managed Care – PPO | Admitting: Internal Medicine

## 2012-03-26 ENCOUNTER — Ambulatory Visit (INDEPENDENT_AMBULATORY_CARE_PROVIDER_SITE_OTHER): Payer: BC Managed Care – PPO | Admitting: Internal Medicine

## 2012-03-26 ENCOUNTER — Encounter: Payer: Self-pay | Admitting: Internal Medicine

## 2012-03-26 VITALS — BP 140/100 | HR 80 | Temp 98.2°F | Resp 16 | Wt 213.0 lb

## 2012-03-26 DIAGNOSIS — I2510 Atherosclerotic heart disease of native coronary artery without angina pectoris: Secondary | ICD-10-CM

## 2012-03-26 DIAGNOSIS — C61 Malignant neoplasm of prostate: Secondary | ICD-10-CM

## 2012-03-26 DIAGNOSIS — J069 Acute upper respiratory infection, unspecified: Secondary | ICD-10-CM

## 2012-03-26 DIAGNOSIS — I10 Essential (primary) hypertension: Secondary | ICD-10-CM

## 2012-03-26 DIAGNOSIS — E785 Hyperlipidemia, unspecified: Secondary | ICD-10-CM

## 2012-03-26 DIAGNOSIS — Z8546 Personal history of malignant neoplasm of prostate: Secondary | ICD-10-CM

## 2012-03-26 MED ORDER — AMOXICILLIN 500 MG PO CAPS: 1000.0000 mg | ORAL_CAPSULE | Freq: Two times a day (BID) | ORAL | Status: AC

## 2012-03-26 NOTE — Progress Notes (Signed)
Patient ID: [name], male   DOB: January 21, 1946, 66 y.o.   MRN: [id number]  Subjective:    Patient ID: [name], male    DOB: 1946/09/20, 66 y.o.   MRN: [id number]  HPI  The patient presents for a follow-up of  chronic hypertension, chronic dyslipidemia, OA, CAD controlled with medicines.  C/o URI - yellow d/c C/o leg cramps    Review of Systems  Constitutional: Negative for appetite change, fatigue and unexpected weight change.  HENT: Negative for nosebleeds, congestion, sore throat, sneezing, trouble swallowing and neck pain.   Eyes: Negative for itching and visual disturbance.  Respiratory: Negative for cough.   Cardiovascular: Negative for chest pain, palpitations and leg swelling.  Gastrointestinal: Negative for nausea, diarrhea, blood in stool and abdominal distention.  Genitourinary: Negative for frequency and hematuria.  Musculoskeletal: Negative for back pain, joint swelling and gait problem.  Skin: Negative for rash.  Neurological: Negative for dizziness, tremors, speech difficulty and weakness.  Psychiatric/Behavioral: Negative for sleep disturbance, dysphoric mood and agitation. The patient is not nervous/anxious.        Objective:   Physical Exam  Constitutional: He is oriented to person, place, and time. He appears well-developed.       obese  HENT:  Mouth/Throat: Oropharynx is clear and moist.  Eyes: Conjunctivae are normal. Pupils are equal, round, and reactive to light.  Neck: Normal range of motion. No JVD present. No thyromegaly present.  Cardiovascular: Normal rate, regular rhythm, normal heart sounds and intact distal pulses.  Exam reveals no gallop and no friction rub.   No murmur heard. Pulmonary/Chest: Effort normal and breath sounds normal. No respiratory distress. He has no wheezes. He has no rales. He exhibits no tenderness.  Abdominal: Soft. Bowel sounds are normal. He exhibits no distension and no mass. There is no tenderness. There is  no rebound and no guarding.  Musculoskeletal: Normal range of motion. He exhibits no edema and no tenderness.  Lymphadenopathy:    He has no cervical adenopathy.  Neurological: He is alert and oriented to person, place, and time. He has normal reflexes. No cranial nerve deficit. He exhibits normal muscle tone. Coordination normal.  Skin: Skin is warm and dry. No rash noted.  Psychiatric: He has a normal mood and affect. His behavior is normal. Judgment and thought content normal.    Lab Results  Component Value Date   WBC 6.8 01/23/2010   HGB 15.6 01/23/2010   HCT 45.5 01/23/2010   PLT PLATELET CLUMPS NOTED ON SMEAR, COUNT APPEARS DECREASED 01/23/2010   GLUCOSE 98 08/09/2011   CHOL  Value: 106        ATP III CLASSIFICATION:  <200     mg/dL   Desirable  782-956  mg/dL   Borderline High  >=213    mg/dL   High        0/86/5784   TRIG 62 01/20/2010   HDL 57 01/20/2010   LDLCALC  Value: 37        Total Cholesterol/HDL:CHD Risk Coronary Heart Disease Risk Table                     Men   Women  1/2 Average Risk   3.4   3.3  Average Risk       5.0   4.4  2 X Average Risk   9.6   7.1  3 X Average Risk  23.4   11.0  Use the calculated Patient Ratio above and the CHD Risk Table to determine the patient's CHD Risk.        ATP III CLASSIFICATION (LDL):  <100     mg/dL   Optimal  409-811  mg/dL   Near or Above                    Optimal  130-159  mg/dL   Borderline  914-782  mg/dL   High  >956     mg/dL   Very High 01/08/864   ALT 17 08/09/2011   AST 32 08/09/2011   NA 140 08/09/2011   K 4.5 08/09/2011   CL 104 08/09/2011   CREATININE 1.0 08/09/2011   BUN 18 08/09/2011   CO2 28 08/09/2011   TSH 1.82 08/09/2011   INR 1.12 01/19/2010   HGBA1C 5.8 08/09/2011         Assessment & Plan:

## 2012-03-26 NOTE — Patient Instructions (Signed)
Can try CoQ10 for cramps; hold Pravastatin

## 2012-03-28 ENCOUNTER — Other Ambulatory Visit: Payer: Self-pay | Admitting: Cardiology

## 2012-03-29 ENCOUNTER — Encounter: Payer: Self-pay | Admitting: Internal Medicine

## 2012-03-29 NOTE — Assessment & Plan Note (Signed)
Continue with current prescription therapy as reflected on the Med list.  

## 2012-03-29 NOTE — Assessment & Plan Note (Signed)
Start Rx 

## 2012-03-29 NOTE — Assessment & Plan Note (Signed)
2001 2012 relapsed 12/12: He had XRT

## 2012-03-29 NOTE — Assessment & Plan Note (Signed)
Continue with current prescription therapy as reflected on the Med list. Hold med if cramps

## 2012-03-30 ENCOUNTER — Other Ambulatory Visit: Payer: Self-pay | Admitting: Cardiology

## 2012-04-01 ENCOUNTER — Ambulatory Visit (INDEPENDENT_AMBULATORY_CARE_PROVIDER_SITE_OTHER): Payer: BC Managed Care – PPO | Admitting: Cardiology

## 2012-04-01 ENCOUNTER — Encounter: Payer: Self-pay | Admitting: Cardiology

## 2012-04-01 VITALS — BP 124/74 | HR 88 | Ht 71.0 in | Wt 213.0 lb

## 2012-04-01 DIAGNOSIS — I2510 Atherosclerotic heart disease of native coronary artery without angina pectoris: Secondary | ICD-10-CM

## 2012-04-01 DIAGNOSIS — R739 Hyperglycemia, unspecified: Secondary | ICD-10-CM

## 2012-04-01 DIAGNOSIS — R7309 Other abnormal glucose: Secondary | ICD-10-CM

## 2012-04-01 DIAGNOSIS — E785 Hyperlipidemia, unspecified: Secondary | ICD-10-CM

## 2012-04-01 NOTE — Patient Instructions (Addendum)
Your physician recommends that you continue on your current medications as directed. Please refer to the Current Medication list given to you today.  Your physician wants you to follow-up in: 1 year  You will receive a reminder letter in the mail two months in advance. If you don't receive a letter, please call our office to schedule the follow-up appointment.  Your physician recommends that you return for lab work in: 6 weeks for fasting cholesterol.

## 2012-04-01 NOTE — Progress Notes (Signed)
HPI [name] comes in today for evaluation and management of his coronary artery disease. He is having no angina or ischemic symptoms. He still working Holiday representative.  Also some inner thigh leg cramps at night, he was told to stop his Pravachol. He has noticed no change in the cramps. His podiatrist or to stretch his legs good at night before going to bed. I have reinforced.  I see no fasting lipids since 2011. He's had some mild hyperglycemia but a normal hemoglobin A1c.  Past Medical History  Diagnosis Date  . HYPERLIPIDEMIA 06/14/2008  . GOUT 06/14/2008  . Carpal tunnel syndrome 06/07/2008  . HYPERTENSION 02/02/2010  . CORONARY ARTERY DISEASE 01/12/2008  . SINUSITIS- ACUTE-NOS 07/05/2009  . BRONCHITIS, ACUTE 10/17/2008  . DIVERTICULOSIS, COLON 06/14/2008  . RENAL INSUFFICIENCY, CHRONIC 02/02/2010  . Cellulitis and abscess of other specified site 06/14/2008  . OSTEOARTHRITIS 01/12/2008  . LOW BACK PAIN 01/12/2008  . LEG PAIN 01/10/2010  . PARESTHESIA 06/07/2008  . Epistaxis 03/15/2010  . CERVICAL STRAIN 06/07/2008  . PROSTATE CANCER, HX OF 01/12/2008  . TOBACCO USE, QUIT 01/10/2010  . Lumbar disc disease 03/14/2011  . S/P lumbar discectomy 03/14/2011    Current Outpatient Prescriptions  Medication Sig Dispense Refill  . allopurinol (ZYLOPRIM) 300 MG tablet Take 1 tablet (300 mg total) by mouth daily.  30 tablet  0  . aspirin 325 MG tablet Take 325 mg by mouth daily.        . Cholecalciferol (VITAMIN D3) 1000 UNITS CAPS Take by mouth daily.        [name] Kitchen HYDROcodone-acetaminophen (NORCO) 5-325 MG per tablet Take 1 tablet by mouth. 1-2 by mouth two times a day as needed for pain       . meloxicam (MOBIC) 15 MG tablet Take 1 tablet (15 mg total) by mouth daily as needed for pain.  90 tablet  3  . metoprolol tartrate (LOPRESSOR) 25 MG tablet Take 25 mg by mouth 2 (two) times daily.        . Multiple Vitamins-Minerals (CENTRUM PO) Take by mouth daily.        . nitroGLYCERIN (NITROSTAT) 0.4 MG SL  tablet Place 0.4 mg under the tongue every 5 (five) minutes as needed.        . Omega-3 Fatty Acids (FISH OIL) 1000 MG CAPS Take by mouth daily.        . perindopril (ACEON) 8 MG tablet TAKE 1 TABLET EVERY DAY  30 tablet  5  . triamcinolone (KENALOG) 0.5 % ointment Apply topically 2 (two) times daily. For rash  120 g  1  . ZETIA 10 MG tablet TAKE 1 TABLET EVERY DAY  30 tablet  3  . amoxicillin (AMOXIL) 500 MG capsule Take 2 capsules (1,000 mg total) by mouth 2 (two) times daily.  40 capsule  0  . pravastatin (PRAVACHOL) 80 MG tablet TAKE 1 TABLET AT BEDTIME  30 tablet  5    No Known Allergies  Family History  Problem Relation Age of Onset  . Hypertension Other     History   Social History  . Marital Status: Married    Spouse Name: N/A    Number of Children: N/A  . Years of Education: N/A   Occupational History  . Not on file.   Social History Main Topics  . Smoking status: Former Games developer  . Smokeless tobacco: Not on file   Comment: quit appox 7 yrs ago  . Alcohol Use: Not on file  .  Drug Use: Not on file  . Sexually Active: Not on file   Other Topics Concern  . Not on file   Social History Narrative  . No narrative on file    ROS ALL NEGATIVE EXCEPT THOSE NOTED IN HPI  PE  General Appearance: well developed, well nourished in no acute distress HEENT: symmetrical face, PERRLA, good dentition  Neck: no JVD, thyromegaly, or adenopathy, trachea midline Chest: symmetric without deformity Cardiac: PMI non-displaced, RRR, normal S1, S2, no gallop or murmur Lung: clear to ausculation and percussion Vascular: all pulses full without bruits  Abdominal: nondistended, nontender, good bowel sounds, no HSM, no bruits Extremities: no cyanosis, clubbing or edema, no sign of DVT, no varicosities , no tenderness in his thighs. Skin: normal color, no rashes Neuro: alert and oriented x 3, non-focal Pysch: normal affect  EKG  Normal sinus rhythm, nonspecific T wave changes the  5 to V6 and aVL.    Component Value Date/Time   NA 140 08/09/2011 1129   K 4.5 08/09/2011 1129   CL 104 08/09/2011 1129   CO2 28 08/09/2011 1129   GLUCOSE 98 08/09/2011 1129   BUN 18 08/09/2011 1129   CREATININE 1.0 08/09/2011 1129   CALCIUM 9.2 08/09/2011 1129   GFRNONAA >60 01/23/2010 0830   GFRAA  Value: >60        The eGFR has been calculated using the MDRD equation. This calculation has not been validated in all clinical situations. eGFR's persistently <60 mL/min signify possible Chronic Kidney Disease. 01/23/2010 0830    Lipid Panel     Component Value Date/Time   CHOL  Value: 106        ATP III CLASSIFICATION:  <200     mg/dL   Desirable  161-096  mg/dL   Borderline High  >=045    mg/dL   High        03/03/8118 0610   TRIG 62 01/20/2010 0610   HDL 57 01/20/2010 0610   CHOLHDL 1.9 01/20/2010 0610   VLDL 12 01/20/2010 0610   LDLCALC  Value: 37        Total Cholesterol/HDL:CHD Risk Coronary Heart Disease Risk Table                     Men   Women  1/2 Average Risk   3.4   3.3  Average Risk       5.0   4.4  2 X Average Risk   9.6   7.1  3 X Average Risk  23.4   11.0        Use the calculated Patient Ratio above and the CHD Risk Table to determine the patient's CHD Risk.        ATP III CLASSIFICATION (LDL):  <100     mg/dL   Optimal  147-829  mg/dL   Near or Above                    Optimal  130-159  mg/dL   Borderline  562-130  mg/dL   High  >865     mg/dL   Very High 7/84/6962 9528    CBC    Component Value Date/Time   WBC 6.8 01/23/2010 0830   RBC 4.85 01/23/2010 0830   HGB 15.6 01/23/2010 0830   HCT 45.5 01/23/2010 0830   PLT PLATELET CLUMPS NOTED ON SMEAR, COUNT APPEARS DECREASED 01/23/2010 0830   MCV 94.0 01/23/2010 0830   MCHC 34.2 01/23/2010 0830  RDW 13.1 01/23/2010 0830   LYMPHSABS 1.6 01/22/2010 0640   MONOABS 0.5 01/22/2010 0640   EOSABS 0.3 01/22/2010 0640   BASOSABS 0.1 01/22/2010 0640

## 2012-04-01 NOTE — Assessment & Plan Note (Signed)
Stable. Restart Pravachol and check lipids in 6 weeks. No change in treatment.

## 2012-05-06 ENCOUNTER — Other Ambulatory Visit (INDEPENDENT_AMBULATORY_CARE_PROVIDER_SITE_OTHER): Payer: BC Managed Care – PPO

## 2012-05-06 DIAGNOSIS — E785 Hyperlipidemia, unspecified: Secondary | ICD-10-CM

## 2012-05-06 LAB — LIPID PANEL
HDL: 75.1 mg/dL (ref >39.00)
LDL Cholesterol: 95 mg/dL (ref 0–99)
VLDL: 17.6 mg/dL (ref 0.0–40.0)

## 2012-05-06 LAB — HEPATIC FUNCTION PANEL
ALT: 23 U/L (ref 0–53)
AST: 36 U/L (ref 0–37)
Albumin: 4.3 g/dL (ref 3.5–5.2)
Total Bilirubin: 0.8 mg/dL (ref 0.3–1.2)
Total Protein: 7 g/dL (ref 6.0–8.3)

## 2012-05-06 LAB — BASIC METABOLIC PANEL
BUN: 20 mg/dL (ref 6–23)
Chloride: 105 mEq/L (ref 96–112)
Creatinine, Ser: 1.1 mg/dL (ref 0.4–1.5)
Glucose, Bld: 99 mg/dL (ref 70–99)
Potassium: 4.5 mEq/L (ref 3.5–5.1)

## 2012-05-26 ENCOUNTER — Ambulatory Visit: Payer: BC Managed Care – PPO | Admitting: Internal Medicine

## 2012-05-30 ENCOUNTER — Other Ambulatory Visit: Payer: Self-pay | Admitting: Cardiovascular Disease

## 2012-05-30 ENCOUNTER — Other Ambulatory Visit: Payer: Self-pay | Admitting: Cardiology

## 2012-06-29 ENCOUNTER — Ambulatory Visit: Payer: BC Managed Care – PPO | Admitting: Radiation Oncology

## 2012-07-04 ENCOUNTER — Other Ambulatory Visit: Payer: Self-pay | Admitting: Internal Medicine

## 2012-07-06 ENCOUNTER — Ambulatory Visit: Payer: BC Managed Care – PPO | Admitting: Radiation Oncology

## 2012-07-19 ENCOUNTER — Encounter: Payer: Self-pay | Admitting: Radiation Oncology

## 2012-07-20 ENCOUNTER — Encounter: Payer: Self-pay | Admitting: Radiation Oncology

## 2012-07-20 ENCOUNTER — Ambulatory Visit
Admission: RE | Admit: 2012-07-20 | Discharge: 2012-07-20 | Disposition: A | Discharge status: Home / Self Care | Payer: BC Managed Care – PPO | Source: Ambulatory Visit | Attending: Radiation Oncology | Admitting: Radiation Oncology

## 2012-07-20 VITALS — BP 131/70 | HR 79 | Temp 97.9°F | Resp 18 | Wt 218.7 lb

## 2012-07-20 DIAGNOSIS — C61 Malignant neoplasm of prostate: Secondary | ICD-10-CM

## 2012-07-20 NOTE — Progress Notes (Signed)
Radiation Oncology         [phone] ________________________________  Name: [name] MRN: [id number]  Date: 07/20/2012  DOB: 04/15/1946  Follow-Up Visit Note  CC: Sonda Primes, MD  Anner Crete, MD  Diagnosis:   Recurrent prostate cancer  Interval Since Last Radiation:  8 months  Narrative:  The patient returns today for routine follow-up.  He is doing very well at this time. His only residual  side effect after radiation therapy is bowel urgency. He denies any diarrhea or rectal bleeding. Patient denies any urinary symptoms. He has nocturia x1.  The patient's last PSA was down to 0.12 ng/mL through Dr. Belva Crome office.  Pretreatment PSA was 0.25 ng/mL.                            ALLERGIES:   has no known allergies.  Meds: Current Outpatient Prescriptions  Medication Sig Dispense Refill  . allopurinol (ZYLOPRIM) 300 MG tablet Take 1 tablet (300 mg total) by mouth daily.  30 tablet  0  . allopurinol (ZYLOPRIM) 300 MG tablet TAKE 1 TABLET BY MOUTH EVERY DAY  90 tablet  3  . aspirin 325 MG tablet Take 325 mg by mouth daily.        . Cholecalciferol (VITAMIN D3) 1000 UNITS CAPS Take by mouth daily.        [name] Kitchen HYDROcodone-acetaminophen (NORCO) 5-325 MG per tablet Take 1 tablet by mouth. 1-2 by mouth two times a day as needed for pain       . meloxicam (MOBIC) 15 MG tablet Take 1 tablet (15 mg total) by mouth daily as needed for pain.  90 tablet  3  . metoprolol tartrate (LOPRESSOR) 25 MG tablet TAKE 1 TABLET BY MOUTH TWICE A DAY  60 tablet  7  . Multiple Vitamins-Minerals (CENTRUM PO) Take by mouth daily.        . nitroGLYCERIN (NITROSTAT) 0.4 MG SL tablet Place 0.4 mg under the tongue every 5 (five) minutes as needed.        . Omega-3 Fatty Acids (FISH OIL) 1000 MG CAPS Take by mouth daily.        . perindopril (ACEON) 8 MG tablet TAKE 1 TABLET EVERY DAY  30 tablet  5  . pravastatin (PRAVACHOL) 80 MG tablet TAKE 1 TABLET AT BEDTIME  30 tablet  5  . triamcinolone (KENALOG)  0.5 % ointment Apply topically 2 (two) times daily. For rash  120 g  1  . ZETIA 10 MG tablet TAKE 1 TABLET EVERY DAY  30 tablet  3  . ZETIA 10 MG tablet TAKE 1 TABLET EVERY DAY  30 tablet  3  . cephALEXin (KEFLEX) 500 MG capsule         Physical Findings: The patient is in no acute distress. Patient is alert and oriented.  weight is 218 lb 11.2 oz (99.202 kg). His oral temperature is 97.9 F (36.6 C). His blood pressure is 131/70 and his pulse is 79. His respiration is 18. [name] Kitchen  No palpable cervical or supraclavicular adenopathy. The lungs are clear to auscultation. The heart has regular rhythm and rate. The abdomen is soft nontender with normal bowel sounds.  Lab Findings: Lab Results  Component Value Date   WBC 6.8 01/23/2010   HGB 15.6 01/23/2010   HCT 45.5 01/23/2010   MCV 94.0 01/23/2010   PLT PLATELET CLUMPS NOTED ON SMEAR, COUNT APPEARS DECREASED 01/23/2010    @  LASTCHEM@  Radiographic Findings: No results found.  Impression:  The patient is recovering from the effects of radiation.  His PSA is decreasing nicely  and has little if any side effects from his treatment.  Plan:  When necessary followup. Patient  will continue close followup with Dr. Annabell Howells.  _____________________________________    Billie Lade, PhD, MD

## 2012-07-20 NOTE — Progress Notes (Signed)
Patient presents to the clinic today unaccompanied for a follow up appointment with Dr. Roselind Messier. Patient is alert and oriented to person, place, and time. No distress noted. Steady gait noted. Pleasant affect noted. Patient denies pain at this time. Patient denies burning with urination. Patient reports his urine is clear yellow and odorless. Patient denies diarrhea but, reports urgency related to bowel movements. Patient denies pain with BM. Patient reports a strong stream of urine. Patient reports urinary urgency. Patient reports on average he gets up once during the night void. Patient denies nausea, vomiting, headache, or dizziness. Patient has no complaints at this time. Patient to follow up with PCP 07/30/2012. Reported all findings to Dr. Roselind Messier.

## 2012-07-30 ENCOUNTER — Encounter: Payer: Self-pay | Admitting: Internal Medicine

## 2012-07-30 ENCOUNTER — Ambulatory Visit (INDEPENDENT_AMBULATORY_CARE_PROVIDER_SITE_OTHER): Payer: BC Managed Care – PPO | Admitting: Internal Medicine

## 2012-07-30 VITALS — BP 120/72 | HR 80 | Temp 97.7°F | Resp 16 | Wt 219.0 lb

## 2012-07-30 DIAGNOSIS — M545 Low back pain, unspecified: Secondary | ICD-10-CM

## 2012-07-30 DIAGNOSIS — E785 Hyperlipidemia, unspecified: Secondary | ICD-10-CM

## 2012-07-30 DIAGNOSIS — R739 Hyperglycemia, unspecified: Secondary | ICD-10-CM

## 2012-07-30 DIAGNOSIS — R7309 Other abnormal glucose: Secondary | ICD-10-CM

## 2012-07-30 DIAGNOSIS — I10 Essential (primary) hypertension: Secondary | ICD-10-CM

## 2012-07-30 NOTE — Assessment & Plan Note (Signed)
Continue with current prescription therapy as reflected on the Med list.  

## 2012-07-30 NOTE — Progress Notes (Signed)
   Subjective:    Patient ID: [name], male    DOB: 03-Sep-1946, 66 y.o.   MRN: [id number]  HPI  The patient presents for a follow-up of  chronic hypertension, chronic dyslipidemia, OA, CAD controlled with medicines. He finished XRT, Dr Melissa Noon has d/c'd him. Seeing Dr Annabell Howells q 6 mo.  Wt Readings from Last 3 Encounters:  07/30/12 219 lb (99.338 kg)  07/20/12 218 lb 11.2 oz (99.202 kg)  04/01/12 213 lb (96.616 kg)   BP Readings from Last 3 Encounters:  07/30/12 120/72  07/20/12 131/70  04/01/12 124/74      Review of Systems  Constitutional: Negative for appetite change, fatigue and unexpected weight change.  HENT: Negative for nosebleeds, congestion, sore throat, sneezing, trouble swallowing and neck pain.   Eyes: Negative for itching and visual disturbance.  Respiratory: Negative for cough.   Cardiovascular: Negative for chest pain, palpitations and leg swelling.  Gastrointestinal: Negative for nausea, diarrhea, blood in stool and abdominal distention.  Genitourinary: Negative for frequency and hematuria.  Musculoskeletal: Negative for back pain, joint swelling and gait problem.  Skin: Negative for rash.  Neurological: Negative for dizziness, tremors, speech difficulty and weakness.  Psychiatric/Behavioral: Negative for disturbed wake/sleep cycle, dysphoric mood and agitation. The patient is not nervous/anxious.        Objective:   Physical Exam  Constitutional: He is oriented to person, place, and time. He appears well-developed.       obese  HENT:  Mouth/Throat: Oropharynx is clear and moist.  Eyes: Conjunctivae are normal. Pupils are equal, round, and reactive to light.  Neck: Normal range of motion. No JVD present. No thyromegaly present.  Cardiovascular: Normal rate, regular rhythm, normal heart sounds and intact distal pulses.  Exam reveals no gallop and no friction rub.   No murmur heard. Pulmonary/Chest: Effort normal and breath sounds normal. No  respiratory distress. He has no wheezes. He has no rales. He exhibits no tenderness.  Abdominal: Soft. Bowel sounds are normal. He exhibits no distension and no mass. There is no tenderness. There is no rebound and no guarding.  Musculoskeletal: Normal range of motion. He exhibits no edema and no tenderness.  Lymphadenopathy:    He has no cervical adenopathy.  Neurological: He is alert and oriented to person, place, and time. He has normal reflexes. No cranial nerve deficit. He exhibits normal muscle tone. Coordination normal.  Skin: Skin is warm and dry. No rash noted.  Psychiatric: He has a normal mood and affect. His behavior is normal. Judgment and thought content normal.    Lab Results  Component Value Date   WBC 6.8 01/23/2010   HGB 15.6 01/23/2010   HCT 45.5 01/23/2010   PLT PLATELET CLUMPS NOTED ON SMEAR, COUNT APPEARS DECREASED 01/23/2010   GLUCOSE 99 05/06/2012   CHOL 188 05/06/2012   TRIG 88.0 05/06/2012   HDL 75.10 05/06/2012   LDLCALC 95 05/06/2012   ALT 23 05/06/2012   AST 36 05/06/2012   NA 139 05/06/2012   K 4.5 05/06/2012   CL 105 05/06/2012   CREATININE 1.1 05/06/2012   BUN 20 05/06/2012   CO2 26 05/06/2012   TSH 1.82 08/09/2011   INR 1.12 01/19/2010   HGBA1C 5.8 08/09/2011         Assessment & Plan:

## 2012-07-30 NOTE — Assessment & Plan Note (Signed)
Monitoring labs 

## 2012-08-07 ENCOUNTER — Other Ambulatory Visit: Payer: Self-pay | Admitting: Internal Medicine

## 2012-09-16 ENCOUNTER — Other Ambulatory Visit: Payer: Self-pay | Admitting: Cardiology

## 2012-09-16 ENCOUNTER — Ambulatory Visit (INDEPENDENT_AMBULATORY_CARE_PROVIDER_SITE_OTHER): Payer: BC Managed Care – PPO | Admitting: Internal Medicine

## 2012-09-16 ENCOUNTER — Encounter: Payer: Self-pay | Admitting: Internal Medicine

## 2012-09-16 VITALS — BP 104/76 | HR 82 | Temp 97.9°F | Resp 18 | Ht 71.0 in | Wt 214.0 lb

## 2012-09-16 DIAGNOSIS — M545 Low back pain: Secondary | ICD-10-CM

## 2012-09-16 DIAGNOSIS — I10 Essential (primary) hypertension: Secondary | ICD-10-CM

## 2012-09-16 DIAGNOSIS — IMO0002 Reserved for concepts with insufficient information to code with codable children: Secondary | ICD-10-CM

## 2012-09-16 DIAGNOSIS — Z9889 Other specified postprocedural states: Secondary | ICD-10-CM

## 2012-09-16 DIAGNOSIS — M5416 Radiculopathy, lumbar region: Secondary | ICD-10-CM

## 2012-09-16 MED ORDER — TRAMADOL HCL 50 MG PO TABS: 50.0000 mg | ORAL_TABLET | Freq: Two times a day (BID) | ORAL | Status: DC | PRN

## 2012-09-16 MED ORDER — PREDNISONE 10 MG PO TABS: ORAL_TABLET | ORAL | Status: DC

## 2012-09-16 NOTE — Assessment & Plan Note (Signed)
Prednisone 10 mg: take 4 tabs a day x 3 days; then 3 tabs a day x 4 days; then 2 tabs a day x 4 days, then 1 tab a day x 6 days, then stop. Take pc. NS f/up if needed

## 2012-09-16 NOTE — Assessment & Plan Note (Signed)
Prednisone 10 mg: take 4 tabs a day x 3 days; then 3 tabs a day x 4 days; then 2 tabs a day x 4 days, then 1 tab a day x 6 days, then stop. Take pc. Tramadol prn

## 2012-09-16 NOTE — Assessment & Plan Note (Signed)
There were no sx's since surgery

## 2012-09-16 NOTE — Assessment & Plan Note (Signed)
Continue with current prescription therapy as reflected on the Med list.  

## 2012-09-16 NOTE — Progress Notes (Signed)
   Subjective:    Patient ID: [name], male    DOB: Aug 07, 1946, 66 y.o.   MRN: [id number]  HPI C/o LBP with irrad to R LE off and on x 2-3 d, Pain is 8/10 at times.  The patient presents for a follow-up of  chronic hypertension, chronic dyslipidemia, OA, CAD controlled with medicines. He finished XRT, Dr Melissa Noon has d/c'd him. Seeing Dr Annabell Howells q 6 mo.  Wt Readings from Last 3 Encounters:  09/16/12 214 lb (97.07 kg)  07/30/12 219 lb (99.338 kg)  07/20/12 218 lb 11.2 oz (99.202 kg)   BP Readings from Last 3 Encounters:  09/16/12 104/76  07/30/12 120/72  07/20/12 131/70      Review of Systems  Constitutional: Negative for appetite change, fatigue and unexpected weight change.  HENT: Negative for nosebleeds, congestion, sore throat, sneezing, trouble swallowing and neck pain.   Eyes: Negative for itching and visual disturbance.  Respiratory: Negative for cough.   Cardiovascular: Negative for chest pain, palpitations and leg swelling.  Gastrointestinal: Negative for nausea, diarrhea, blood in stool and abdominal distention.  Genitourinary: Negative for frequency and hematuria.  Musculoskeletal: Negative for back pain, joint swelling and gait problem.  Skin: Negative for rash.  Neurological: Negative for dizziness, tremors, speech difficulty and weakness.  Psychiatric/Behavioral: Negative for disturbed wake/sleep cycle, dysphoric mood and agitation. The patient is not nervous/anxious.        Objective:   Physical Exam  Constitutional: He is oriented to person, place, and time. He appears well-developed.       obese  HENT:  Mouth/Throat: Oropharynx is clear and moist.  Eyes: Conjunctivae normal are normal. Pupils are equal, round, and reactive to light.  Neck: Normal range of motion. No JVD present. No thyromegaly present.  Cardiovascular: Normal rate, regular rhythm, normal heart sounds and intact distal pulses.  Exam reveals no gallop and no friction rub.   No murmur  heard. Pulmonary/Chest: Effort normal and breath sounds normal. No respiratory distress. He has no wheezes. He has no rales. He exhibits no tenderness.  Abdominal: Soft. Bowel sounds are normal. He exhibits no distension and no mass. There is no tenderness. There is no rebound and no guarding.  Musculoskeletal: Normal range of motion. He exhibits no edema and no tenderness.  Lymphadenopathy:    He has no cervical adenopathy.  Neurological: He is alert and oriented to person, place, and time. He has normal reflexes. No cranial nerve deficit. He exhibits normal muscle tone. Coordination normal.  Skin: Skin is warm and dry. No rash noted.  Psychiatric: He has a normal mood and affect. His behavior is normal. Judgment and thought content normal.  LS is tender Str leg elev was +/- on R  Lab Results  Component Value Date   WBC 6.8 01/23/2010   HGB 15.6 01/23/2010   HCT 45.5 01/23/2010   PLT PLATELET CLUMPS NOTED ON SMEAR, COUNT APPEARS DECREASED 01/23/2010   GLUCOSE 99 05/06/2012   CHOL 188 05/06/2012   TRIG 88.0 05/06/2012   HDL 75.10 05/06/2012   LDLCALC 95 05/06/2012   ALT 23 05/06/2012   AST 36 05/06/2012   NA 139 05/06/2012   K 4.5 05/06/2012   CL 105 05/06/2012   CREATININE 1.1 05/06/2012   BUN 20 05/06/2012   CO2 26 05/06/2012   TSH 1.82 08/09/2011   INR 1.12 01/19/2010   HGBA1C 5.8 08/09/2011         Assessment & Plan:

## 2012-10-27 ENCOUNTER — Other Ambulatory Visit: Payer: Self-pay | Admitting: *Deleted

## 2012-10-27 MED ORDER — PERINDOPRIL ERBUMINE 8 MG PO TABS: 8.0000 mg | ORAL_TABLET | Freq: Every day | ORAL | Status: DC

## 2012-11-11 ENCOUNTER — Ambulatory Visit (INDEPENDENT_AMBULATORY_CARE_PROVIDER_SITE_OTHER): Payer: BC Managed Care – PPO | Admitting: Internal Medicine

## 2012-11-11 ENCOUNTER — Encounter: Payer: Self-pay | Admitting: Internal Medicine

## 2012-11-11 VITALS — BP 140/88 | HR 80 | Temp 97.5°F | Resp 16 | Wt 221.0 lb

## 2012-11-11 DIAGNOSIS — J329 Chronic sinusitis, unspecified: Secondary | ICD-10-CM

## 2012-11-11 DIAGNOSIS — M109 Gout, unspecified: Secondary | ICD-10-CM

## 2012-11-11 MED ORDER — AMOXICILLIN-POT CLAVULANATE 875-125 MG PO TABS: 1.0000 | ORAL_TABLET | Freq: Two times a day (BID) | ORAL | Status: DC

## 2012-11-11 NOTE — Progress Notes (Signed)
Subjective:    Patient ID: [name], male    DOB: June 13, 1946, 66 y.o.   MRN: [id number]  Sinusitis This is a new problem. The current episode started in the past 7 days. There has been no fever. Pertinent negatives include no congestion, coughing, neck pain, sneezing or sore throat. The treatment provided no relief.   C/o LBP with irrad to R LE off and on x 2-3 d, Pain is 8/10 at times.  The patient presents for a follow-up of  chronic hypertension, chronic dyslipidemia, OA, CAD controlled with medicines. He finished XRT, Dr Melissa Noon has d/c'd him. Seeing Dr Annabell Howells q 6 mo.  Wt Readings from Last 3 Encounters:  11/11/12 221 lb (100.245 kg)  09/16/12 214 lb (97.07 kg)  07/30/12 219 lb (99.338 kg)   BP Readings from Last 3 Encounters:  11/11/12 140/88  09/16/12 104/76  07/30/12 120/72      Review of Systems  Constitutional: Negative for appetite change, fatigue and unexpected weight change.  HENT: Negative for nosebleeds, congestion, sore throat, sneezing, trouble swallowing and neck pain.   Eyes: Negative for itching and visual disturbance.  Respiratory: Negative for cough.   Cardiovascular: Negative for chest pain, palpitations and leg swelling.  Gastrointestinal: Negative for nausea, diarrhea, blood in stool and abdominal distention.  Genitourinary: Negative for frequency and hematuria.  Musculoskeletal: Negative for back pain, joint swelling and gait problem.  Skin: Negative for rash.  Neurological: Negative for dizziness, tremors, speech difficulty and weakness.  Psychiatric/Behavioral: Negative for sleep disturbance, dysphoric mood and agitation. The patient is not nervous/anxious.        Objective:   Physical Exam  Constitutional: He is oriented to person, place, and time. He appears well-developed.       obese  HENT:  Mouth/Throat: Oropharynx is clear and moist.  Eyes: Conjunctivae normal are normal. Pupils are equal, round, and reactive to light.  Neck:  Normal range of motion. No JVD present. No thyromegaly present.  Cardiovascular: Normal rate, regular rhythm, normal heart sounds and intact distal pulses.  Exam reveals no gallop and no friction rub.   No murmur heard. Pulmonary/Chest: Effort normal and breath sounds normal. No respiratory distress. He has no wheezes. He has no rales. He exhibits no tenderness.  Abdominal: Soft. Bowel sounds are normal. He exhibits no distension and no mass. There is no tenderness. There is no rebound and no guarding.  Musculoskeletal: Normal range of motion. He exhibits no edema and no tenderness.  Lymphadenopathy:    He has no cervical adenopathy.  Neurological: He is alert and oriented to person, place, and time. He has normal reflexes. No cranial nerve deficit. He exhibits normal muscle tone. Coordination normal.  Skin: Skin is warm and dry. No rash noted.  Psychiatric: He has a normal mood and affect. His behavior is normal. Judgment and thought content normal.  eryth mucosa, nasal  Lab Results  Component Value Date   WBC 6.8 01/23/2010   HGB 15.6 01/23/2010   HCT 45.5 01/23/2010   PLT PLATELET CLUMPS NOTED ON SMEAR, COUNT APPEARS DECREASED 01/23/2010   GLUCOSE 99 05/06/2012   CHOL 188 05/06/2012   TRIG 88.0 05/06/2012   HDL 75.10 05/06/2012   LDLCALC 95 05/06/2012   ALT 23 05/06/2012   AST 36 05/06/2012   NA 139 05/06/2012   K 4.5 05/06/2012   CL 105 05/06/2012   CREATININE 1.1 05/06/2012   BUN 20 05/06/2012   CO2 26 05/06/2012   TSH 1.82 08/09/2011  INR 1.12 01/19/2010   HGBA1C 5.8 08/09/2011         Assessment & Plan:

## 2012-11-11 NOTE — Assessment & Plan Note (Signed)
Continue with current prescription therapy as reflected on the Med list.  

## 2012-11-11 NOTE — Assessment & Plan Note (Signed)
Augmentin

## 2013-01-04 ENCOUNTER — Other Ambulatory Visit: Payer: Self-pay | Admitting: *Deleted

## 2013-01-04 MED ORDER — EZETIMIBE 10 MG PO TABS: 10.0000 mg | ORAL_TABLET | Freq: Every day | ORAL | Status: DC

## 2013-01-05 ENCOUNTER — Other Ambulatory Visit: Payer: Self-pay | Admitting: Cardiology

## 2013-01-11 ENCOUNTER — Telehealth: Payer: Self-pay | Admitting: Cardiology

## 2013-01-11 NOTE — Telephone Encounter (Signed)
Pam, from  [facility] urology Called regarding pt needing to have surgical clearance for penial prosthesis . The surgery will be scheduled pending the surgical clearance. Pt's last office visit was 04/01/12 with Dr. Daleen Squibb. Left a message for pt to called so he can be  schedule for an OV for surgical clearance.

## 2013-01-11 NOTE — Telephone Encounter (Signed)
Pt having replacement of penial prosthesis TBD, pt needs clearance for surgical procedure

## 2013-01-12 ENCOUNTER — Telehealth: Payer: Self-pay | Admitting: Internal Medicine

## 2013-01-12 NOTE — Telephone Encounter (Signed)
Will forward to [facility] to contact pt to schedule appt.

## 2013-01-12 NOTE — Telephone Encounter (Signed)
Forward 4 pages form [employer] [facility] to Dr. Macarthur Critchley Plotnikov for review on 01-12-13 tym

## 2013-01-13 ENCOUNTER — Ambulatory Visit (INDEPENDENT_AMBULATORY_CARE_PROVIDER_SITE_OTHER): Payer: BC Managed Care – PPO | Admitting: Physician Assistant

## 2013-01-13 ENCOUNTER — Encounter: Payer: Self-pay | Admitting: Physician Assistant

## 2013-01-13 VITALS — BP 124/78 | HR 73 | Ht 71.0 in | Wt 225.2 lb

## 2013-01-13 DIAGNOSIS — Z01810 Encounter for preprocedural cardiovascular examination: Secondary | ICD-10-CM

## 2013-01-13 DIAGNOSIS — E785 Hyperlipidemia, unspecified: Secondary | ICD-10-CM

## 2013-01-13 DIAGNOSIS — I2510 Atherosclerotic heart disease of native coronary artery without angina pectoris: Secondary | ICD-10-CM

## 2013-01-13 DIAGNOSIS — I10 Essential (primary) hypertension: Secondary | ICD-10-CM

## 2013-01-13 NOTE — Patient Instructions (Addendum)
Your physician wants you to follow-up in: 6 MONTHS WITH DR. WALL. You will receive a reminder letter in the mail two months in advance. If you don't receive a letter, please call our office to schedule the follow-up appointment.  NO CHANGES WERE MADE TODAY

## 2013-01-13 NOTE — Progress Notes (Signed)
[address] [place], [place]  [address] Phone: [phone], Fax:  [phone]  Date:  01/13/2013   ID:  Rane, Blitch [birth year], MRN [id number]  PCP:  Sonda Primes, MD  Primary Cardiologist:  Dr. Valera Castle     History of Present Illness: [name] is a 67 y.o. male returns for surgical clearance.  He needs a penile implant replaced with Dr. Bjorn Pippin.  He has a hx of CAD, s/p posterior wall MI in 2004 treated with overlapping stents in the CFX (one Zeta stent and one Taxus DES), s/p cutting balloon angioplasty of the CFX secondary to ISR in 12/2009, HL, HTN, gout, CKD.  Last stress perfusion study 03/2011 which demonstrated prior inferior and inferolateral defect consistent with prior MI but no ischemia.  Last seen by Dr. Daleen Squibb 5/13.  Since last seen, he is doing well. He denies any chest pain, shortness of breath, syncope, orthopnea, PND or edema. He remains quite active and works 60-70 hours per week. He is able to achieve greater than 4 METs without angina.    Labs (1/14):  K 3.9, creatinine 1.3, ALT 37, TSH 3.01, Hgb 15.9, LDL 137.  Wt Readings from Last 3 Encounters:  01/13/13 225 lb 3.2 oz (102.15 kg)  11/11/12 221 lb (100.245 kg)  09/16/12 214 lb (97.07 kg)     Past Medical History  Diagnosis Date  . HYPERLIPIDEMIA 06/14/2008  . GOUT 06/14/2008  . Carpal tunnel syndrome 06/07/2008  . HYPERTENSION 02/02/2010  . CORONARY ARTERY DISEASE 01/12/2008    a. s/p post wall MI in 2004 => overlapping stents in the CFX (one Zeta stent and one Taxus DES);  b. [place] => LHC 2/11: Normal LM, normal LAD, normal Dx, CFX 80% ISR, PDA 30-40%, EF 55%. PCI: Cutting balloon angioplasty to the CFX ISR;  c.  Lex MV 5/12: inf and IL defect c/w prior MI, no ischemia, EF 54%  . Hx of echocardiogram 06/14/2008    a. echo 12/2009: mild LVH, EF 60-65%  . Cellulitis and abscess of other specified site 06/14/2008  . OSTEOARTHRITIS 01/12/2008    injection to spine on  04/01/11  . PROSTATE CANCER, HX OF 01/12/2008  . Former smoker 01/10/2010  . Lumbar disc disease 03/14/2011    hx of low back pain, paresthesia, leg pain  . S/P lumbar discectomy 03/14/2011  . S/P radiation therapy 10/02/11 - 11/25/11    Central Lower Pelvis: 6840 cGy/38 Fractions  . Diverticulosis     Current Outpatient Prescriptions  Medication Sig Dispense Refill  . acetaminophen (TYLENOL) 500 MG tablet Take 500 mg by mouth daily.      [name] Kitchen allopurinol (ZYLOPRIM) 300 MG tablet TAKE 1 TABLET BY MOUTH EVERY DAY  90 tablet  3  . aspirin 325 MG tablet Take 325 mg by mouth daily.        [name] Kitchen atorvastatin (LIPITOR) 80 MG tablet Take 80 mg by mouth daily.      . Cholecalciferol (VITAMIN D3) 1000 UNITS CAPS Take by mouth daily.        [name] Kitchen ibuprofen (ADVIL,MOTRIN) 200 MG tablet Take 200 mg by mouth daily.      [name] Kitchen lisinopril (PRINIVIL,ZESTRIL) 40 MG tablet Take 20 mg by mouth daily.       . meloxicam (MOBIC) 15 MG tablet TAKE 1 TABLET (15 MG TOTAL) BY MOUTH DAILY AS NEEDED FOR PAIN.  90 tablet  3  . metoprolol tartrate (LOPRESSOR) 25 MG tablet TAKE 1 TABLET  BY MOUTH TWICE A DAY  60 tablet  7  . Multiple Vitamins-Minerals (CENTRUM PO) Take by mouth daily.        . nitroGLYCERIN (NITROSTAT) 0.4 MG SL tablet Place 0.4 mg under the tongue every 5 (five) minutes as needed.        . Omega-3 Fatty Acids (FISH OIL) 1000 MG CAPS Take by mouth daily.        . traMADol (ULTRAM) 50 MG tablet Take 1-2 tablets (50-100 mg total) by mouth 2 (two) times daily as needed for pain.  60 tablet  3  . triamcinolone (KENALOG) 0.5 % ointment Apply topically 2 (two) times daily. For rash  120 g  1  . vitamin B-12 (CYANOCOBALAMIN) 500 MCG tablet Take 500 mcg by mouth daily.       No current facility-administered medications for this visit.    Allergies:   No Known Allergies  Social History:  The patient  reports that he quit smoking about 8 years ago. He does not have any smokeless tobacco history on file. He reports that  drinks  alcohol. He reports that he does not use illicit drugs.   ROS:  Please see the history of present illness.    All other systems reviewed and negative.   PHYSICAL EXAM: VS:  BP 124/78  Pulse 73  Ht 5\' 11"  (1.803 m)  Wt 225 lb 3.2 oz (102.15 kg)  BMI 31.42 kg/m2 Well nourished, well developed, in no acute distress HEENT: normal Neck: no JVD Vascular:  No carotid bruits Cardiac:  normal S1, S2; RRR; no murmur Lungs:  clear to auscultation bilaterally, no wheezing, rhonchi or rales Abd: soft, nontender, no hepatomegaly Ext: no edema Skin: warm and dry Neuro:  CNs 2-12 intact, no focal abnormalities noted  EKG:  NSR, HR 73, NSSTTW changes     ASSESSMENT AND PLAN:  1. Surgical Clearance:  He does not have any unstable cardiac conditions.  He is able to achieve more than 4 METs without symptoms of angina.  He had a low risk myoview in 2012.  His GU procedure should be low risk.  According to ACC/AHA guidelines, he does not require further cardiac testing prior to proceeding with his non-cardiac procedure and should be at acceptable risk.  He has a 1st generation drug eluting stent placed in 2004 in the circumflex.  He should remain on Aspirin without stopping.  He would be at increased risk of late stent thrombosis with cessation of all anti-platelet drugs.  He should continue taking his beta blocker (metoprolol) to reduce the risk of cardiovascular complications.   2. Coronary Artery Disease:  Stable. No angina. Continue aspirin and statin. 3. Hyperlipidemia: Managed by primary care provider at the [facility]. 4. Hypertension: Controlled. 5. Disposition: Followup with Dr. Daleen Squibb in 6 months.  Luna Glasgow, PA-C  12:32 PM 01/13/2013

## 2013-01-20 ENCOUNTER — Telehealth: Payer: Self-pay | Admitting: Physician Assistant

## 2013-01-20 NOTE — Telephone Encounter (Signed)
New problem   Pam from Dr Annabell Howells is calling checking on the clearance for the pt. She wanted to know she could get the information concerning this.

## 2013-01-21 ENCOUNTER — Telehealth: Payer: Self-pay | Admitting: *Deleted

## 2013-01-21 NOTE — Telephone Encounter (Signed)
Follow Up   Called back to give you fax number : [phone]

## 2013-01-21 NOTE — Telephone Encounter (Signed)
01/13/13 OV note from Tereso Newcomer, [facility] faxed to Dr. Bjorn Pippin for surg clearance today fax # [phone] per Pam @ Dr. Belva Crome office

## 2013-01-22 ENCOUNTER — Other Ambulatory Visit: Payer: Self-pay | Admitting: Urology

## 2013-01-28 ENCOUNTER — Ambulatory Visit (INDEPENDENT_AMBULATORY_CARE_PROVIDER_SITE_OTHER): Payer: BC Managed Care – PPO | Admitting: Internal Medicine

## 2013-01-28 ENCOUNTER — Encounter: Payer: Self-pay | Admitting: Internal Medicine

## 2013-01-28 VITALS — BP 110/80 | HR 80 | Temp 97.7°F | Resp 16 | Ht 71.0 in | Wt 221.0 lb

## 2013-01-28 DIAGNOSIS — N189 Chronic kidney disease, unspecified: Secondary | ICD-10-CM

## 2013-01-28 DIAGNOSIS — I10 Essential (primary) hypertension: Secondary | ICD-10-CM

## 2013-01-28 DIAGNOSIS — R739 Hyperglycemia, unspecified: Secondary | ICD-10-CM

## 2013-01-28 DIAGNOSIS — I2510 Atherosclerotic heart disease of native coronary artery without angina pectoris: Secondary | ICD-10-CM

## 2013-01-28 DIAGNOSIS — M545 Low back pain: Secondary | ICD-10-CM

## 2013-01-28 DIAGNOSIS — R7309 Other abnormal glucose: Secondary | ICD-10-CM

## 2013-01-28 DIAGNOSIS — M109 Gout, unspecified: Secondary | ICD-10-CM

## 2013-01-28 NOTE — Assessment & Plan Note (Signed)
Continue with current prescription therapy as reflected on the Med list.  

## 2013-01-28 NOTE — Assessment & Plan Note (Signed)
BMET 

## 2013-01-28 NOTE — Progress Notes (Signed)
   Subjective:    HPI  F/u LBP - better  The patient presents for a follow-up of  chronic hypertension, chronic dyslipidemia, OA, CAD controlled with medicines. He finished XRT, Dr Melissa Noon has d/c'd him. Seeing Dr Annabell Howells q 6 mo. Chol med was [facility] by [facility] Readings from Last 3 Encounters:  01/28/13 221 lb (100.245 kg)  01/13/13 225 lb 3.2 oz (102.15 kg)  11/11/12 221 lb (100.245 kg)   BP Readings from Last 3 Encounters:  01/28/13 110/80  01/13/13 124/78  11/11/12 140/88      Review of Systems  Constitutional: Negative for appetite change, fatigue and unexpected weight change.  HENT: Negative for nosebleeds and trouble swallowing.   Eyes: Negative for itching and visual disturbance.  Cardiovascular: Negative for chest pain, palpitations and leg swelling.  Gastrointestinal: Negative for nausea, diarrhea, blood in stool and abdominal distention.  Genitourinary: Negative for frequency and hematuria.  Musculoskeletal: Negative for back pain, joint swelling and gait problem.  Skin: Negative for rash.  Neurological: Negative for dizziness, tremors, speech difficulty and weakness.  Psychiatric/Behavioral: Negative for sleep disturbance, dysphoric mood and agitation. The patient is not nervous/anxious.        Objective:   Physical Exam  Constitutional: He is oriented to person, place, and time. He appears well-developed.  obese  HENT:  Mouth/Throat: Oropharynx is clear and moist.  Eyes: Conjunctivae are normal. Pupils are equal, round, and reactive to light.  Neck: Normal range of motion. No JVD present. No thyromegaly present.  Cardiovascular: Normal rate, regular rhythm, normal heart sounds and intact distal pulses.  Exam reveals no gallop and no friction rub.   No murmur heard. Pulmonary/Chest: Effort normal and breath sounds normal. No respiratory distress. He has no wheezes. He has no rales. He exhibits no tenderness.  Abdominal: Soft. Bowel sounds are normal. He exhibits  no distension and no mass. There is no tenderness. There is no rebound and no guarding.  Musculoskeletal: Normal range of motion. He exhibits no edema and no tenderness.  Lymphadenopathy:    He has no cervical adenopathy.  Neurological: He is alert and oriented to person, place, and time. He has normal reflexes. No cranial nerve deficit. He exhibits normal muscle tone. Coordination normal.  Skin: Skin is warm and dry. No rash noted.  Psychiatric: He has a normal mood and affect. His behavior is normal. Judgment and thought content normal.     Lab Results  Component Value Date   WBC 6.8 01/23/2010   HGB 15.6 01/23/2010   HCT 45.5 01/23/2010   PLT PLATELET CLUMPS NOTED ON SMEAR, COUNT APPEARS DECREASED 01/23/2010   GLUCOSE 99 05/06/2012   CHOL 188 05/06/2012   TRIG 88.0 05/06/2012   HDL 75.10 05/06/2012   LDLCALC 95 05/06/2012   ALT 23 05/06/2012   AST 36 05/06/2012   NA 139 05/06/2012   K 4.5 05/06/2012   CL 105 05/06/2012   CREATININE 1.1 05/06/2012   BUN 20 05/06/2012   CO2 26 05/06/2012   TSH 1.82 08/09/2011   INR 1.12 01/19/2010   HGBA1C 5.8 08/09/2011         Assessment & Plan:

## 2013-01-28 NOTE — Assessment & Plan Note (Signed)
  On diet  

## 2013-01-28 NOTE — Assessment & Plan Note (Signed)
No relapse 

## 2013-02-02 ENCOUNTER — Telehealth: Payer: Self-pay | Admitting: Cardiology

## 2013-02-02 DIAGNOSIS — E785 Hyperlipidemia, unspecified: Secondary | ICD-10-CM

## 2013-02-02 NOTE — Telephone Encounter (Signed)
Pt making may fu appt, wants to know if he needs blood work, pls call

## 2013-02-03 ENCOUNTER — Encounter (HOSPITAL_COMMUNITY): Payer: Self-pay | Admitting: Pharmacy Technician

## 2013-02-03 NOTE — Telephone Encounter (Signed)
I spoke with pt and he will come in to the office on 04/14/13 for fasting lipid & cmp. Orders placed and appt mailed to pt. Mylo Red RN

## 2013-02-10 ENCOUNTER — Encounter (HOSPITAL_COMMUNITY): Payer: Self-pay

## 2013-02-10 ENCOUNTER — Encounter (HOSPITAL_COMMUNITY)
Admission: RE | Admit: 2013-02-10 | Discharge: 2013-02-10 | Disposition: A | Discharge status: Home / Self Care | Payer: BC Managed Care – PPO | Source: Ambulatory Visit | Attending: Urology | Admitting: Urology

## 2013-02-10 ENCOUNTER — Ambulatory Visit (HOSPITAL_COMMUNITY)
Admission: RE | Admit: 2013-02-10 | Discharge: 2013-02-10 | Disposition: A | Discharge status: Home / Self Care | Payer: BC Managed Care – PPO | Source: Ambulatory Visit | Attending: Urology | Admitting: Urology

## 2013-02-10 DIAGNOSIS — Y831 Surgical operation with implant of artificial internal device as the cause of abnormal reaction of the patient, or of later complication, without mention of misadventure at the time of the procedure: Secondary | ICD-10-CM | POA: Insufficient documentation

## 2013-02-10 DIAGNOSIS — Z01812 Encounter for preprocedural laboratory examination: Secondary | ICD-10-CM | POA: Insufficient documentation

## 2013-02-10 DIAGNOSIS — T8389XA Other specified complication of genitourinary prosthetic devices, implants and grafts, initial encounter: Secondary | ICD-10-CM | POA: Insufficient documentation

## 2013-02-10 HISTORY — DX: Other specified postprocedural states: Z98.890

## 2013-02-10 HISTORY — DX: Other specified postprocedural states: R11.2

## 2013-02-10 LAB — CBC
HCT: 44.9 % (ref 39.0–52.0)
Hemoglobin: 15.1 g/dL (ref 13.0–17.0)
MCH: 30.9 pg (ref 26.0–34.0)
MCHC: 33.6 g/dL (ref 30.0–36.0)
MCV: 92 fL (ref 78.0–100.0)
RDW: 12.5 % (ref 11.5–15.5)

## 2013-02-10 LAB — BASIC METABOLIC PANEL
BUN: 23 mg/dL (ref 6–23)
Creatinine, Ser: 1.17 mg/dL (ref 0.50–1.35)
GFR calc Af Amer: 73 mL/min — ABNORMAL LOW (ref >90)
GFR calc non Af Amer: 63 mL/min — ABNORMAL LOW (ref >90)
Glucose, Bld: 101 mg/dL — ABNORMAL HIGH (ref 70–99)
Potassium: 4.3 mEq/L (ref 3.5–5.1)

## 2013-02-10 IMAGING — CR DG CHEST 2V
2 series · 2 of 2 positions shown · non-contrast
Comparison: [DATE]

CLINICAL DATA: Preop penile prosthesis replacement.

CHEST - 2 VIEW

[w chest pa]
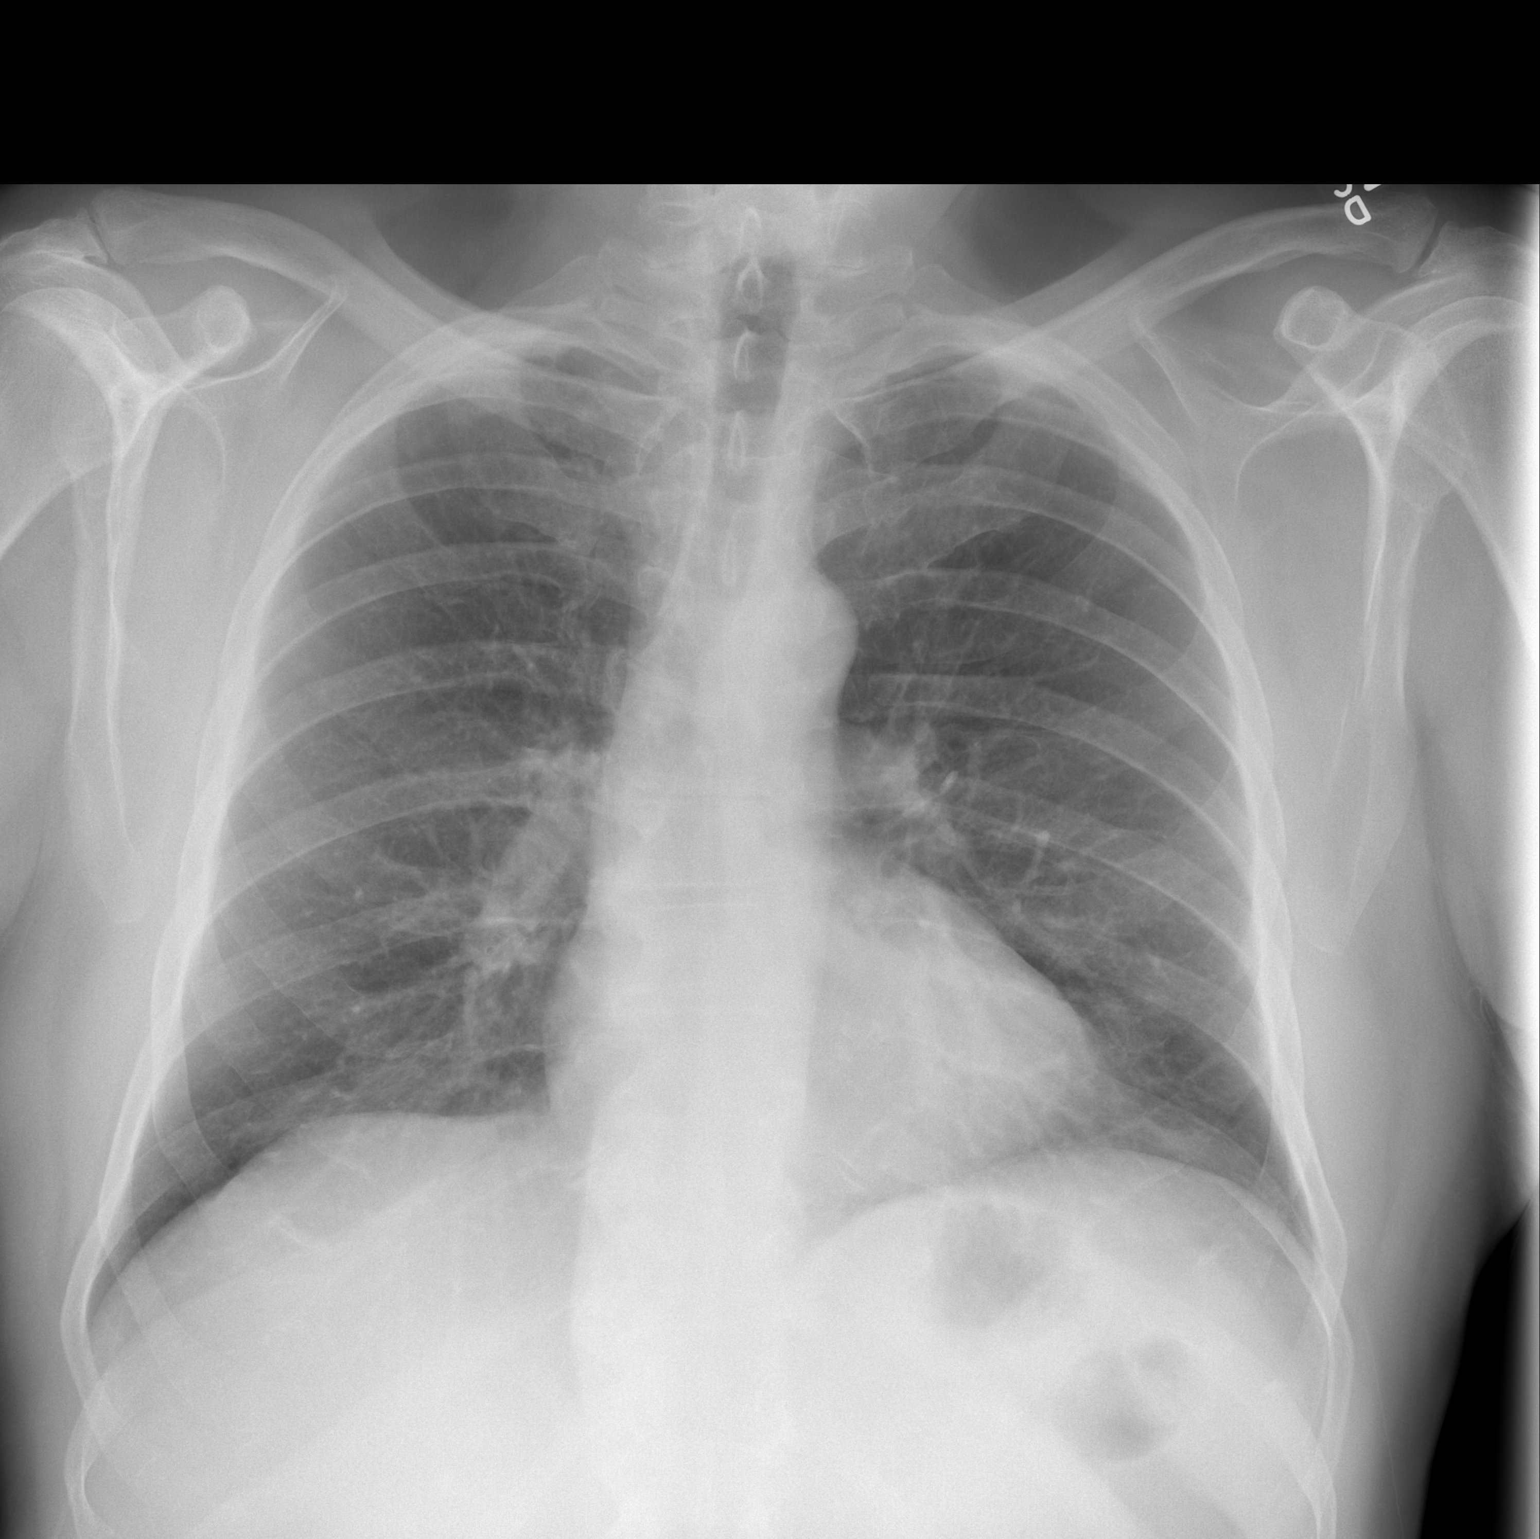

[w chest lat]
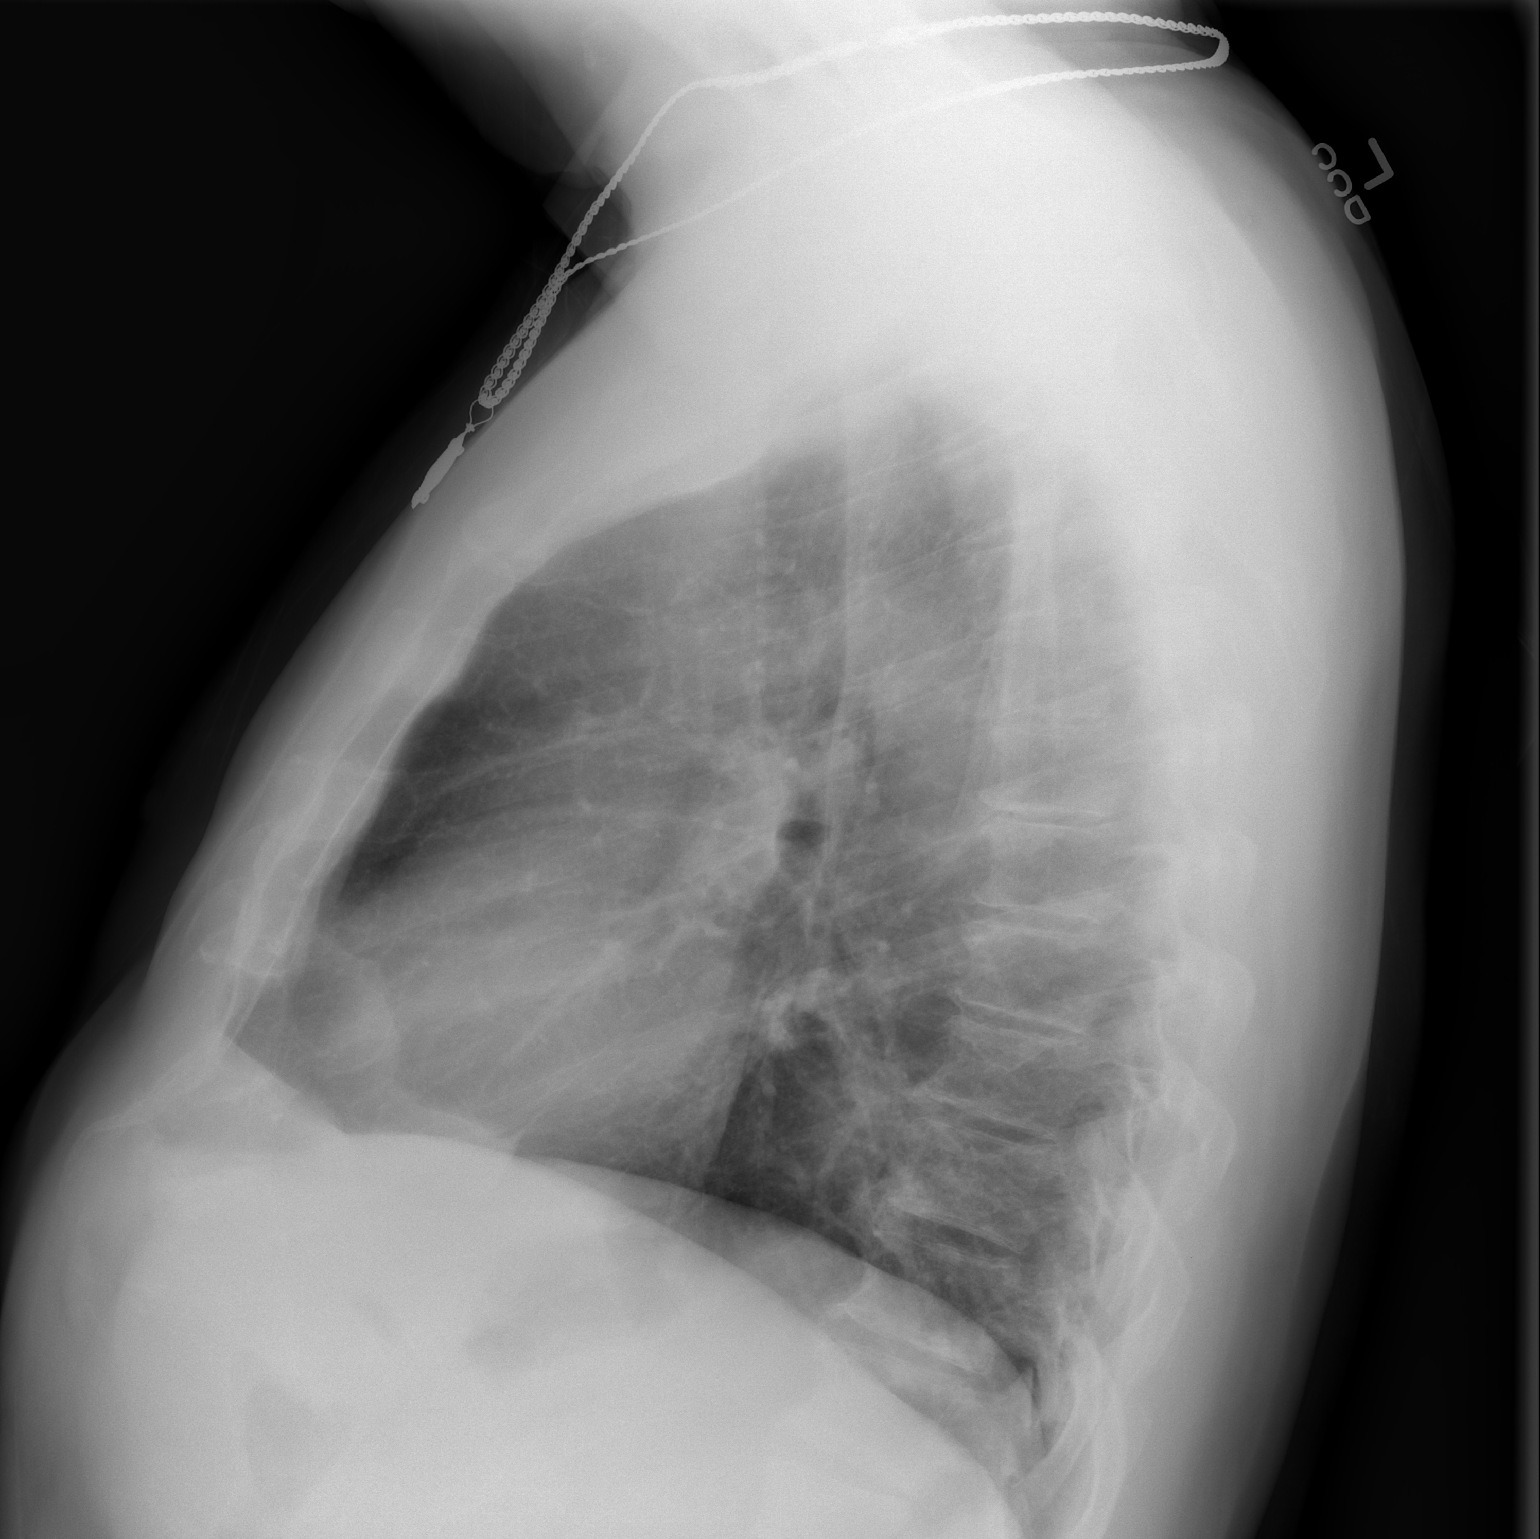

[2 of 2 positions shown; findings below may reference images not displayed]

FINDINGS: Lungs are clear without infiltrate or effusion.  Negative
for mass lesion.  Negative for heart failure.
IMPRESSION: No acute cardiopulmonary abnormality.  No change from the prior
study.

## 2013-02-10 NOTE — Patient Instructions (Signed)
[address] FRANKO  02/10/2013   Your procedure is scheduled on: 02/16/13  Report to [facility] at 0700 AM.  Call this number if you have problems the morning of surgery 336-: [phone]   Remember:   Do not eat food or drink liquids After Midnight.   Do not wear jewelry, make-up or nail polish.  Do not wear lotions, powders, or perfumes. You may wear deodorant.  Do not shave 48 hours prior to surgery. Men may shave face and neck.  Do not bring valuables to the hospital.  Contacts, dentures or bridgework may not be worn into surgery.  Leave suitcase in the car. After surgery it may be brought to your room.  For patients admitted to the hospital, checkout time is 11:00 AM the day of discharge.     Please read over the following fact sheets that you were given: MRSA Information.  Birdie Sons, RN  pre op nurse call if needed [phone]    FAILURE TO FOLLOW THESE INSTRUCTIONS MAY RESULT IN CANCELLATION OF YOUR SURGERY   Patient Signature: ___________________________________________

## 2013-02-10 NOTE — Progress Notes (Signed)
Office visit note and surgery clearance 01/13/13 Tereso Newcomer, PA-C on chart, EKG 01/13/13 on EPIC

## 2013-02-15 MED ORDER — GENTAMICIN SULFATE 40 MG/ML IJ SOLN
420.0000 mg | INTRAVENOUS | Status: AC
  Administered 2013-02-16: 420 mg via INTRAVENOUS
  Filled 2013-02-15: qty 10.5

## 2013-02-15 MED ORDER — VANCOMYCIN HCL 10 G IV SOLR
1500.0000 mg | INTRAVENOUS | Status: AC
  Administered 2013-02-16: 1500 mg via INTRAVENOUS
  Filled 2013-02-15: qty 1500

## 2013-02-15 NOTE — H&P (Signed)
ctive Problems Problems  1. Lumbago 724.2 2. Nephrolithiasis Of Both Kidneys 592.0 3. History of  Organic Impotence 607.84 4. Prostate Cancer 185  History of Present Illness  [name] returns today in f/u.  He has a malfunctioning multicomponent penile prosthesis that needs to be replaced.  He went to the [place] yesterday and they recommended that he return here for care.  He has a history of prostate cancer treated initially with prostatectomy followed by salvage radiation.  His PSA has been falling.  He is voiding well with minimal incontinence.   Past Medical History Problems  1. History of  Acute Myocardial Infarction V12.59 2. History of  Cancer 199.1 3. History of  Gout 274.9 4. History of  Gross Hematuria 599.71 5. History of  Heart Disease 429.9 6. History of  Hypercholesterolemia 272.0 7. History of  Hypertension 401.9 8. History of  Organic Impotence 607.84 9. History of  Prostate Cancer V10.46 10. History of  Ureteral Stone Left 592.1  Surgical History Problems  1. History of  Back Surgery 2. History of  Back Surgery 3. History of  Cystoscopy With Ureteroscopy Right 4. History of  Neck Surgery 5. History of  Prostatectomy Retropubic Radical With Lymph Node Biopsy(S) 6. History of  Surg Penis Insertion Of Penile Prosthesis  Current Meds 1. Aceon 8 MG Oral Tablet; Therapy: 25Jan2008 to 2. Allopurinol 300 MG Oral Tablet; Therapy: 24Jan2008 to 3. Centrum Silver TABS; Therapy: (Recorded:09Apr2012) to 4. Fish Oil CAPS; Therapy: (Recorded:09Apr2012) to 5. Hydrocodone-Acetaminophen TABS; Therapy: (Recorded:10Oct2012) to 6. Ibuprofen TABS; Therapy: (Recorded:16Oct2013) to 7. Meloxicam 15 MG Oral Tablet; Therapy: (Recorded:10Oct2012) to 8. Metoprolol Tartrate TABS; Therapy: (Recorded:09Apr2012) to 9. Nitrostat 0.4 MG Sublingual Tablet Sublingual; Therapy: 01Mar2011 to 10. Pravastatin Sodium 80 MG Oral Tablet; Therapy: 26Oct2011 to 11. Vitamin D TABS; Therapy:  (Recorded:09Apr2012) to 12. Zetia 10 MG Oral Tablet; Therapy: 02Feb2008 to  Allergies Medication  1. No Known Drug Allergies No Known Allergies  2. No Known Allergies  Family History Problems  1. Family history of  Death In The Family Father died age 34-heart disease 2. Family history of  Death In The Family Mother died 45-cirrosis of liver 3. Paternal history of  Heart Disease V17.49  Social History Problems  1. Alcohol Use 2. Caffeine Use 3. Marital History - Currently Married 4. Occupation: Designer, industrial/product 5. Tobacco Use V15.82 quit 7+ yrs ago   Past and social history reviewed.   He did find out that he was exposed to agent orange in [place].   Review of Systems  Cardiovascular: no chest pain.  Respiratory: no shortness of breath.    Vitals Vital Signs [Data Includes: Last 1 Day]  12Feb2014 12:13PM  BMI Calculated: 30.8 BSA Calculated: 2.19 Weight: 220 lb  Blood Pressure: 122 / 77 Heart Rate: 75  Physical Exam Constitutional: Well nourished and well developed . No acute distress.  Pulmonary: No respiratory distress and normal respiratory rhythm and effort.  Cardiovascular: Heart rate and rhythm are normal . No peripheral edema.    Results/Data Urine [Data Includes: Last 1 Day]   12Feb2014  COLOR YELLOW   APPEARANCE CLEAR   SPECIFIC GRAVITY 1.025   pH 6.0   GLUCOSE NEG mg/dL  BILIRUBIN NEG   KETONE NEG mg/dL  BLOOD NEG   PROTEIN NEG mg/dL  UROBILINOGEN 0.2 mg/dL  NITRITE NEG   LEUKOCYTE ESTERASE NEG    Assessment Assessed  1. History of  Organic Impotence 607.84   He has a malfunctioning IPP and needs a  replacement.   Plan Health Maintenance (V70.0)  1. UA With REFLEX  Done: 12Feb2014 12:02PM PMH: Organic Impotence (607.84)  2. Follow-up Schedule Surgery Office  Follow-up  Requested for: 12Feb2014   I am going to schedule the procedure. He had a prior penoscrotal implant and I reviewed the risks at our last visit.    Discussion/Summary  CC: Dr. Valera Castle and Dr. Sonda Primes.

## 2013-02-16 ENCOUNTER — Encounter (HOSPITAL_COMMUNITY)
Admission: RE | Disposition: A | Discharge status: Home / Self Care | Payer: Self-pay | Source: Ambulatory Visit | Attending: Urology

## 2013-02-16 ENCOUNTER — Observation Stay (HOSPITAL_COMMUNITY)
Admission: RE | Admit: 2013-02-16 | Discharge: 2013-02-17 | Disposition: A | Discharge status: Home / Self Care | Payer: BC Managed Care – PPO | Source: Ambulatory Visit | Attending: Urology | Admitting: Urology

## 2013-02-16 ENCOUNTER — Encounter (HOSPITAL_COMMUNITY): Payer: Self-pay | Admitting: Anesthesiology

## 2013-02-16 ENCOUNTER — Ambulatory Visit (HOSPITAL_COMMUNITY): Payer: BC Managed Care – PPO | Admitting: Anesthesiology

## 2013-02-16 ENCOUNTER — Encounter (HOSPITAL_COMMUNITY): Payer: Self-pay | Admitting: *Deleted

## 2013-02-16 DIAGNOSIS — Y831 Surgical operation with implant of artificial internal device as the cause of abnormal reaction of the patient, or of later complication, without mention of misadventure at the time of the procedure: Secondary | ICD-10-CM | POA: Insufficient documentation

## 2013-02-16 DIAGNOSIS — I10 Essential (primary) hypertension: Secondary | ICD-10-CM | POA: Insufficient documentation

## 2013-02-16 DIAGNOSIS — M109 Gout, unspecified: Secondary | ICD-10-CM | POA: Insufficient documentation

## 2013-02-16 DIAGNOSIS — I252 Old myocardial infarction: Secondary | ICD-10-CM | POA: Insufficient documentation

## 2013-02-16 DIAGNOSIS — Z79899 Other long term (current) drug therapy: Secondary | ICD-10-CM | POA: Insufficient documentation

## 2013-02-16 DIAGNOSIS — E7800 Pure hypercholesterolemia, unspecified: Secondary | ICD-10-CM | POA: Insufficient documentation

## 2013-02-16 DIAGNOSIS — T8389XA Other specified complication of genitourinary prosthetic devices, implants and grafts, initial encounter: Principal | ICD-10-CM | POA: Insufficient documentation

## 2013-02-16 DIAGNOSIS — N5231 Erectile dysfunction following radical prostatectomy: Secondary | ICD-10-CM

## 2013-02-16 DIAGNOSIS — I2510 Atherosclerotic heart disease of native coronary artery without angina pectoris: Secondary | ICD-10-CM | POA: Insufficient documentation

## 2013-02-16 DIAGNOSIS — N529 Male erectile dysfunction, unspecified: Secondary | ICD-10-CM | POA: Insufficient documentation

## 2013-02-16 DIAGNOSIS — Z9189 Other specified personal risk factors, not elsewhere classified: Secondary | ICD-10-CM | POA: Insufficient documentation

## 2013-02-16 DIAGNOSIS — C61 Malignant neoplasm of prostate: Secondary | ICD-10-CM | POA: Insufficient documentation

## 2013-02-16 DIAGNOSIS — Z9079 Acquired absence of other genital organ(s): Secondary | ICD-10-CM | POA: Insufficient documentation

## 2013-02-16 HISTORY — PX: PENILE PROSTHESIS IMPLANT: SHX240

## 2013-02-16 SURGERY — INSERTION, PENILE PROSTHESIS, INFLATABLE
Anesthesia: General | Wound class: Clean Contaminated

## 2013-02-16 MED ORDER — PHENYLEPHRINE HCL 10 MG/ML IJ SOLN
10.0000 mg | INTRAVENOUS | Status: DC | PRN
  Administered 2013-02-16: 40 ug/min via INTRAVENOUS

## 2013-02-16 MED ORDER — PHENYLEPHRINE HCL 10 MG/ML IJ SOLN
INTRAMUSCULAR | Status: DC | PRN
  Administered 2013-02-16: 40 ug via INTRAVENOUS
  Administered 2013-02-16 (×2): 60 ug via INTRAVENOUS
  Administered 2013-02-16: 40 ug via INTRAVENOUS

## 2013-02-16 MED ORDER — HYDROMORPHONE HCL PF 1 MG/ML IJ SOLN
0.5000 mg | INTRAMUSCULAR | Status: DC | PRN
  Administered 2013-02-16: 1 mg via INTRAVENOUS
  Administered 2013-02-17: 0.5 mg via INTRAVENOUS
  Filled 2013-02-16 (×2): qty 1

## 2013-02-16 MED ORDER — ADULT MULTIVITAMIN W/MINERALS CH
1.0000 | ORAL_TABLET | Freq: Every day | ORAL | Status: DC
  Administered 2013-02-16: 1 via ORAL
  Filled 2013-02-16 (×2): qty 1

## 2013-02-16 MED ORDER — ZOLPIDEM TARTRATE 5 MG PO TABS: 5.0000 mg | ORAL_TABLET | Freq: Every evening | ORAL | Status: DC | PRN

## 2013-02-16 MED ORDER — FENTANYL CITRATE 0.05 MG/ML IJ SOLN
INTRAMUSCULAR | Status: DC | PRN
  Administered 2013-02-16: 50 ug via INTRAVENOUS
  Administered 2013-02-16 (×2): 100 ug via INTRAVENOUS

## 2013-02-16 MED ORDER — PROMETHAZINE HCL 25 MG/ML IJ SOLN: 6.2500 mg | INTRAMUSCULAR | Status: DC | PRN

## 2013-02-16 MED ORDER — NITROGLYCERIN 0.4 MG SL SUBL: 0.4000 mg | SUBLINGUAL_TABLET | SUBLINGUAL | Status: DC | PRN

## 2013-02-16 MED ORDER — SULFAMETHOXAZOLE-TMP DS 800-160 MG PO TABS
1.0000 | ORAL_TABLET | Freq: Two times a day (BID) | ORAL | Status: DC
  Administered 2013-02-16: 1 via ORAL
  Filled 2013-02-16 (×3): qty 1

## 2013-02-16 MED ORDER — LACTATED RINGERS IV SOLN
INTRAVENOUS | Status: DC | PRN
  Administered 2013-02-16 (×3): via INTRAVENOUS

## 2013-02-16 MED ORDER — OXYCODONE HCL 5 MG PO TABS: 5.0000 mg | ORAL_TABLET | ORAL | Status: DC | PRN

## 2013-02-16 MED ORDER — SODIUM CHLORIDE 0.9 % IR SOLN
Status: DC | PRN
  Administered 2013-02-16: 1000 mL

## 2013-02-16 MED ORDER — LACTATED RINGERS IV SOLN: INTRAVENOUS | Status: DC

## 2013-02-16 MED ORDER — HYOSCYAMINE SULFATE 0.125 MG SL SUBL
0.1250 mg | SUBLINGUAL_TABLET | SUBLINGUAL | Status: DC | PRN
  Administered 2013-02-17: 0.125 mg via ORAL
  Filled 2013-02-16 (×2): qty 1

## 2013-02-16 MED ORDER — ONDANSETRON HCL 4 MG/2ML IJ SOLN: 4.0000 mg | INTRAMUSCULAR | Status: DC | PRN

## 2013-02-16 MED ORDER — METOPROLOL TARTRATE 12.5 MG HALF TABLET
12.5000 mg | ORAL_TABLET | Freq: Every day | ORAL | Status: DC
  Administered 2013-02-16: 12.5 mg via ORAL
  Filled 2013-02-16 (×2): qty 1

## 2013-02-16 MED ORDER — ACETAMINOPHEN 10 MG/ML IV SOLN
INTRAVENOUS | Status: AC
  Filled 2013-02-16: qty 100

## 2013-02-16 MED ORDER — DOCUSATE SODIUM 100 MG PO CAPS
100.0000 mg | ORAL_CAPSULE | Freq: Two times a day (BID) | ORAL | Status: DC
  Administered 2013-02-16: 100 mg via ORAL
  Filled 2013-02-16 (×3): qty 1

## 2013-02-16 MED ORDER — ATORVASTATIN CALCIUM 40 MG PO TABS
40.0000 mg | ORAL_TABLET | Freq: Every day | ORAL | Status: DC
  Administered 2013-02-16: 40 mg via ORAL
  Filled 2013-02-16 (×2): qty 1

## 2013-02-16 MED ORDER — HYDROMORPHONE HCL PF 1 MG/ML IJ SOLN
INTRAMUSCULAR | Status: AC
  Filled 2013-02-16: qty 1

## 2013-02-16 MED ORDER — ASPIRIN 325 MG PO TABS
325.0000 mg | ORAL_TABLET | Freq: Every day | ORAL | Status: DC
  Administered 2013-02-16: 325 mg via ORAL
  Filled 2013-02-16 (×2): qty 1

## 2013-02-16 MED ORDER — LISINOPRIL 20 MG PO TABS
20.0000 mg | ORAL_TABLET | Freq: Every day | ORAL | Status: DC
  Administered 2013-02-16: 20 mg via ORAL
  Filled 2013-02-16 (×2): qty 1

## 2013-02-16 MED ORDER — ONDANSETRON HCL 4 MG/2ML IJ SOLN
INTRAMUSCULAR | Status: DC | PRN
  Administered 2013-02-16: 4 mg via INTRAVENOUS

## 2013-02-16 MED ORDER — EPHEDRINE SULFATE 50 MG/ML IJ SOLN
INTRAMUSCULAR | Status: DC | PRN
  Administered 2013-02-16: 5 mg via INTRAVENOUS
  Administered 2013-02-16: 7.5 mg via INTRAVENOUS
  Administered 2013-02-16: 5 mg via INTRAVENOUS
  Administered 2013-02-16: 7.5 mg via INTRAVENOUS

## 2013-02-16 MED ORDER — PROPOFOL 10 MG/ML IV BOLUS
INTRAVENOUS | Status: DC | PRN
  Administered 2013-02-16: 150 mg via INTRAVENOUS

## 2013-02-16 MED ORDER — BISACODYL 10 MG RE SUPP: 10.0000 mg | Freq: Every day | RECTAL | Status: DC | PRN

## 2013-02-16 MED ORDER — ALLOPURINOL 100 MG PO TABS
100.0000 mg | ORAL_TABLET | Freq: Every day | ORAL | Status: DC
  Administered 2013-02-16: 100 mg via ORAL
  Filled 2013-02-16 (×2): qty 1

## 2013-02-16 MED ORDER — HYDROMORPHONE HCL PF 1 MG/ML IJ SOLN
0.2500 mg | INTRAMUSCULAR | Status: DC | PRN
  Administered 2013-02-16 (×2): 0.5 mg via INTRAVENOUS

## 2013-02-16 MED ORDER — ACETAMINOPHEN 10 MG/ML IV SOLN
1000.0000 mg | Freq: Four times a day (QID) | INTRAVENOUS | Status: AC
  Administered 2013-02-16 – 2013-02-17 (×4): 1000 mg via INTRAVENOUS
  Filled 2013-02-16 (×3): qty 100

## 2013-02-16 MED ORDER — IBUPROFEN 200 MG PO TABS
200.0000 mg | ORAL_TABLET | Freq: Every day | ORAL | Status: DC
Start: 2013-02-17 — End: 2013-02-17
  Filled 2013-02-16: qty 1

## 2013-02-16 MED ORDER — SODIUM CHLORIDE 0.9 % IR SOLN
Status: DC | PRN
  Administered 2013-02-16: 11:00:00

## 2013-02-16 MED ORDER — MIDAZOLAM HCL 5 MG/5ML IJ SOLN
INTRAMUSCULAR | Status: DC | PRN
  Administered 2013-02-16: 2 mg via INTRAVENOUS

## 2013-02-16 MED ORDER — KCL IN DEXTROSE-NACL 20-5-0.45 MEQ/L-%-% IV SOLN
INTRAVENOUS | Status: DC
  Administered 2013-02-16: 1000 mL via INTRAVENOUS
  Administered 2013-02-17: 04:00:00 via INTRAVENOUS
  Filled 2013-02-16 (×4): qty 1000

## 2013-02-16 MED ORDER — SUCCINYLCHOLINE CHLORIDE 20 MG/ML IJ SOLN
INTRAMUSCULAR | Status: DC | PRN
  Administered 2013-02-16: 100 mg via INTRAVENOUS

## 2013-02-16 SURGICAL SUPPLY — 65 items
ADH SKN CLS APL DERMABOND .7 (GAUZE/BANDAGES/DRESSINGS) ×1
APL SKNCLS STERI-STRIP NONHPOA (GAUZE/BANDAGES/DRESSINGS)
Accessory Kit ×1 IMPLANT
BAG URINE DRAINAGE (UROLOGICAL SUPPLIES) ×2 IMPLANT
BANDAGE COBAN STERILE 2 (GAUZE/BANDAGES/DRESSINGS) IMPLANT
BANDAGE GAUZE ELAST BULKY 4 IN (GAUZE/BANDAGES/DRESSINGS) ×1 IMPLANT
BENZOIN TINCTURE PRP APPL 2/3 (GAUZE/BANDAGES/DRESSINGS) ×1 IMPLANT
BLADE HEX COATED 2.75 (ELECTRODE) ×2 IMPLANT
BLADE SURG 15 STRL LF DISP TIS (BLADE) ×2 IMPLANT
BLADE SURG 15 STRL SS (BLADE) ×2
CANISTER SUCTION 2500CC (MISCELLANEOUS) ×1 IMPLANT
CATH FOLEY 2WAY SLVR  5CC 16FR (CATHETERS) ×1
CATH FOLEY 2WAY SLVR 5CC 16FR (CATHETERS) ×1 IMPLANT
CLOTH BEACON ORANGE TIMEOUT ST (SAFETY) ×2 IMPLANT
COVER MAYO STAND STRL (DRAPES) ×2 IMPLANT
COVER SURGICAL LIGHT HANDLE (MISCELLANEOUS) ×2 IMPLANT
DERMABOND ADVANCED (GAUZE/BANDAGES/DRESSINGS) ×1
DERMABOND ADVANCED .7 DNX12 (GAUZE/BANDAGES/DRESSINGS) IMPLANT
DISSECTOR ROUND CHERRY 3/8 STR (MISCELLANEOUS) ×2 IMPLANT
DRAPE LAPAROTOMY T 102X78X121 (DRAPES) ×2 IMPLANT
DRSG TEGADERM 4X4.75 (GAUZE/BANDAGES/DRESSINGS) ×1 IMPLANT
ELECT REM PT RETURN 9FT ADLT (ELECTROSURGICAL) ×2
ELECTRODE REM PT RTRN 9FT ADLT (ELECTROSURGICAL) ×1 IMPLANT
GLOVE BIO SURGEON STRL SZ7.5 (GLOVE) ×1 IMPLANT
GLOVE BIOGEL PI IND STRL 6.5 (GLOVE) IMPLANT
GLOVE BIOGEL PI IND STRL 7.0 (GLOVE) IMPLANT
GLOVE BIOGEL PI INDICATOR 6.5 (GLOVE) ×1
GLOVE BIOGEL PI INDICATOR 7.0 (GLOVE) ×1
GLOVE SURG SS PI 6.5 STRL IVOR (GLOVE) ×1 IMPLANT
GLOVE SURG SS PI 7.0 STRL IVOR (GLOVE) ×1 IMPLANT
GLOVE SURG SS PI 8.0 STRL IVOR (GLOVE) ×3 IMPLANT
GOWN PREVENTION PLUS XLARGE (GOWN DISPOSABLE) ×1 IMPLANT
GOWN STRL NON-REIN LRG LVL3 (GOWN DISPOSABLE) ×2 IMPLANT
GOWN STRL REIN XL XLG (GOWN DISPOSABLE) ×2 IMPLANT
IMPL SNAPCONE RTE CX 2.0 IMPLANT
IMPLANT SNAPCONE RTE CX 2.0 ×2 IMPLANT
KIT ACCESSORY AMS 700 PUMP (UROLOGICAL SUPPLIES) ×1 IMPLANT
KIT BASIN OR (CUSTOM PROCEDURE TRAY) ×2 IMPLANT
LUBRICANT JELLY K Y 4OZ (MISCELLANEOUS) ×1 IMPLANT
LUBRICANT JELLY ST 5GR 8946 (MISCELLANEOUS) ×5 IMPLANT
MANIFOLD NEPTUNE II (INSTRUMENTS) ×1 IMPLANT
NS IRRIG 1000ML POUR BTL (IV SOLUTION) ×1 IMPLANT
PACK GENERAL/GYN (CUSTOM PROCEDURE TRAY) ×2 IMPLANT
PAD TELFA 2X3 NADH STRL (GAUZE/BANDAGES/DRESSINGS) ×1 IMPLANT
PLUG CATH AND CAP STER (CATHETERS) ×2 IMPLANT
PUMP PRECONNECT MS 18 LGX (Miscellaneous) IMPLANT
PUMP PRECONNECT MS 18CM LGX (Miscellaneous) ×2 IMPLANT
RESERVOIR 65ML PENILE PROS (Urological Implant) ×1 IMPLANT
RETRACTOR WILSON SYSTEM (INSTRUMENTS) ×1 IMPLANT
SOL PREP POV-IOD 16OZ 10% (MISCELLANEOUS) ×2 IMPLANT
SOL PREP PROV IODINE SCRUB 4OZ (MISCELLANEOUS) ×2 IMPLANT
SPONGE GAUZE 4X4 12PLY (GAUZE/BANDAGES/DRESSINGS) ×1 IMPLANT
SPONGE LAP 4X18 X RAY DECT (DISPOSABLE) IMPLANT
STRIP CLOSURE SKIN 1/2X4 (GAUZE/BANDAGES/DRESSINGS) ×1 IMPLANT
SUPPORT SCROTAL LG STRP (MISCELLANEOUS) ×3 IMPLANT
SUT CHROMIC 3 0 SH 27 (SUTURE) ×2 IMPLANT
SUT MNCRL AB 4-0 PS2 18 (SUTURE) ×1 IMPLANT
SUT PDS AB 2-0 CT2 27 (SUTURE) ×6 IMPLANT
SUT VIC AB 2-0 UR6 27 (SUTURE) ×2 IMPLANT
SUT VICRYL 0 UR6 27IN ABS (SUTURE) ×4 IMPLANT
SYR 20CC LL (SYRINGE) ×2 IMPLANT
SYR 50ML LL SCALE MARK (SYRINGE) ×4 IMPLANT
TOWEL OR 17X26 10 PK STRL BLUE (TOWEL DISPOSABLE) ×3 IMPLANT
TRAY PREP A LATEX SAFE STRL (SET/KITS/TRAYS/PACK) ×1 IMPLANT
WATER STERILE IRR 1500ML POUR (IV SOLUTION) ×1 IMPLANT

## 2013-02-16 NOTE — Progress Notes (Signed)
Pt denies any pain. Appetite good ,diet advanced,tol. Well. Foley draining clear yellow urine.Wife in visiting. Dsg to penile area dry/intact. Ice applied to penile area.

## 2013-02-16 NOTE — Brief Op Note (Signed)
02/16/2013  1:24 PM  PATIENT:  [name]  67 y.o. male  PRE-OPERATIVE DIAGNOSIS:  Malfunctioning penile prothesis  POST-OPERATIVE DIAGNOSIS:  Malfunctioning penile prothesis  PROCEDURE:  Procedure(s): REMOVAL and REPLACEMENT OF AMS PENILE PROTHESIS INFLATABLE (N/A)  SURGEON:  Surgeon(s) and Role:    * Anner Crete, MD - Primary    * Antony Haste, MD - Assisting  PHYSICIAN ASSISTANT:   ASSISTANTS: Karma Greaser MD   ANESTHESIA:   general  EBL:  Total I/O In: 2200 [I.V.:2200] Out: 260 [Urine:260]  BLOOD ADMINISTERED:none  DRAINS: Urinary Catheter (Foley)   LOCAL MEDICATIONS USED:  NONE  SPECIMEN:  Source of Specimen:  removed multicomponent penile prosthesis.   DISPOSITION OF SPECIMEN:  PATHOLOGY  COUNTS:  YES  TOURNIQUET:  * No tourniquets in log *  DICTATION: .Other Dictation: Dictation Number [id number]  PLAN OF CARE: Admit for overnight observation  PATIENT DISPOSITION:  PACU - hemodynamically stable.   Delay start of Pharmacological VTE agent (>24hrs) due to surgical blood loss or risk of bleeding: not applicable

## 2013-02-16 NOTE — Anesthesia Preprocedure Evaluation (Addendum)
Anesthesia Evaluation  Patient identified by MRN, date of birth, ID band Patient awake    Reviewed: Allergy & Precautions, H&P , NPO status , Patient's Chart, lab work & pertinent test results  Airway Mallampati: II TM Distance: >3 FB Neck ROM: Full    Dental  (+) Teeth Intact and Caps,    Pulmonary neg pulmonary ROS,  breath sounds clear to auscultation  Pulmonary exam normal       Cardiovascular hypertension, Pt. on medications and Pt. on home beta blockers + CAD, + Past MI and + Cardiac Stents (X 3 2004) Rate:Normal     Neuro/Psych  Neuromuscular disease negative psych ROS   GI/Hepatic negative GI ROS, Neg liver ROS,   Endo/Other  negative endocrine ROS  Renal/GU Renal disease   Hx of prostate CA negative genitourinary   Musculoskeletal negative musculoskeletal ROS (+)   Abdominal   Peds  Hematology negative hematology ROS (+)   Anesthesia Other Findings   Reproductive/Obstetrics negative OB ROS                          Anesthesia Physical Anesthesia Plan  ASA: III  Anesthesia Plan: General   Post-op Pain Management:    Induction: Intravenous  Airway Management Planned: Oral ETT  Additional Equipment:   Intra-op Plan:   Post-operative Plan: Extubation in OR  Informed Consent: I have reviewed the patients History and Physical, chart, labs and discussed the procedure including the risks, benefits and alternatives for the proposed anesthesia with the patient or authorized representative who has indicated his/her understanding and acceptance.   Dental advisory given  Plan Discussed with: CRNA  Anesthesia Plan Comments:        Anesthesia Quick Evaluation

## 2013-02-16 NOTE — Anesthesia Postprocedure Evaluation (Signed)
Anesthesia Post Note  Patient: [name]  Procedure(s) Performed: Procedure(s) (LRB): REPLACEMENT OF AMS PENILE PROTHESIS INFLATABLE (N/A)  Anesthesia type: General  Patient location: PACU  Post pain: Pain level controlled  Post assessment: Post-op Vital signs reviewed  Last Vitals:  Filed Vitals:   02/16/13 1230  BP: 148/78  Pulse: 79  Temp:   Resp: 12    Post vital signs: Reviewed  Level of consciousness: sedated  Complications: No apparent anesthesia complications

## 2013-02-16 NOTE — Care Management Note (Addendum)
    Page 1 of 1   02/17/2013     8:47:17 AM   CARE MANAGEMENT NOTE 02/17/2013  Patient:  [name], [name]   Account Number:  [id number]  Date Initiated:  02/16/2013  Documentation initiated by:  Lanier Clam  Subjective/Objective Assessment:   ADMITTED W/MALFUNCTIONING PENILE PROSTHETIC.     Action/Plan:   FROM HOME W/SPOUSE.HAS PCP,PHARMACY.   Anticipated DC Date:  02/17/2013   Anticipated DC Plan:  HOME/SELF CARE      DC Planning Services  CM consult      Choice offered to / List presented to:             Status of service:  Completed, signed off Medicare Important Message given?   (If response is "NO", the following Medicare IM given date fields will be blank) Date Medicare IM given:   Date Additional Medicare IM given:    Discharge Disposition:  HOME/SELF CARE  Per UR Regulation:  Reviewed for med. necessity/level of care/duration of stay  If discussed at Long Length of Stay Meetings, dates discussed:    Comments:  02/17/13 Yareli Carthen RN,BSN NCM [phone] D/C NO ORDERS OR NEEDS. 02/16/13 Punam Broussard RN,BSN NCM [phone] S/P REPLACEMENT OF PENILE PROTHESIS.

## 2013-02-16 NOTE — Interval H&P Note (Signed)
History and Physical Interval Note:  02/16/2013 9:31 AM  [name]  has presented today for surgery, with the diagnosis of Malfunctioning penile prothesis  The various methods of treatment have been discussed with the patient and family. After consideration of risks, benefits and other options for treatment, the patient has consented to  Procedure(s): REPLACEMENT OF AMS PENILE PROTHESIS INFLATABLE (N/A) as a surgical intervention .  The patient's history has been reviewed, patient examined, no change in status, stable for surgery.  I have reviewed the patient's chart and labs.  Questions were answered to the patient's satisfaction.     Hitoshi Werts J

## 2013-02-16 NOTE — Transfer of Care (Signed)
Immediate Anesthesia Transfer of Care Note  Patient: [name]  Procedure(s) Performed: Procedure(s): REPLACEMENT OF AMS PENILE PROTHESIS INFLATABLE (N/A)  Patient Location: PACU  Anesthesia Type:General  Level of Consciousness: awake, sedated and patient cooperative  Airway & Oxygen Therapy: Patient Spontanous Breathing and Patient connected to face mask oxygen  Post-op Assessment: Report given to PACU RN and Post -op Vital signs reviewed and stable  Post vital signs: Reviewed and stable  Complications: No apparent anesthesia complications

## 2013-02-17 ENCOUNTER — Encounter (HOSPITAL_COMMUNITY): Payer: Self-pay | Admitting: Urology

## 2013-02-17 MED ORDER — OXYCODONE-ACETAMINOPHEN 5-325 MG PO TABS: 1.0000 | ORAL_TABLET | ORAL | Status: DC | PRN

## 2013-02-17 MED ORDER — SULFAMETHOXAZOLE-TMP DS 800-160 MG PO TABS: 1.0000 | ORAL_TABLET | Freq: Two times a day (BID) | ORAL | Status: DC

## 2013-02-17 NOTE — Discharge Summary (Signed)
Physician Discharge Summary  Patient ID: [name] MRN: [id number] DOB/AGE: 12-18-1945 67 y.o.  Admit date: 02/16/2013 Discharge date: 02/17/2013  Admission Diagnoses: Erectile Dysfunction   Discharge Diagnoses: Same Active Problems: Erectile dysfunction   Discharged Condition: good  Hospital Course: [name] had removal and replacement of an AMS 700 multicomponent penile prosthesis.  The surgery was uneventful.  His foley was removed this morning.  He is voiding and without complaints.    Consults: None  Significant Diagnostic Studies: none  Treatments: surgery: as above  Discharge Exam: Blood pressure 129/75, pulse 77, temperature 98 F (36.7 C), temperature source Oral, resp. rate 18, height 5\' 11"  (1.803 m), weight 99.791 kg (220 lb), SpO2 98.00%. General appearance: alert and no distress Male genitalia: normal, Minimal swelling.  Wound intact.  IPP is deflated as desired.   Disposition: Home  Discharge Orders   Future Appointments Provider Department Dept Phone   04/14/2013 8:30 AM [facility] [employer] Main Office [place]) [phone]   04/16/2013 2:30 PM Gaylord Shih, MD [facility] Main Office [place]) [phone]   06/03/2013 9:30 AM Tresa Garter, MD [facility] [phone]   Future Orders Complete By Expires     Discontinue IV  As directed         Medication List    TAKE these medications       allopurinol 300 MG tablet  Commonly known as:  ZYLOPRIM  TAKE 1 TABLET BY MOUTH EVERY DAY     aspirin 325 MG tablet  Take 325 mg by mouth daily.     atorvastatin 80 MG tablet  Commonly known as:  LIPITOR  Take 40 mg by mouth every evening.     Fish Oil 1000 MG Caps  Take 1 capsule by mouth daily.     ibuprofen 200 MG tablet  Commonly known as:  ADVIL,MOTRIN  Take 200 mg by mouth daily.     lisinopril 40 MG tablet  Commonly known as:  PRINIVIL,ZESTRIL  Take 20 mg by mouth every evening.  Takes 1/2 tablet     meloxicam 15 MG tablet  Commonly known as:  MOBIC  TAKE 1 TABLET (15 MG TOTAL) BY MOUTH DAILY AS NEEDED FOR PAIN.     metoprolol tartrate 25 MG tablet  Commonly known as:  LOPRESSOR  Take 12.5 mg by mouth every evening.     multivitamin with minerals Tabs  Take 1 tablet by mouth daily.     nitroGLYCERIN 0.4 MG SL tablet  Commonly known as:  NITROSTAT  Place 0.4 mg under the tongue every 5 (five) minutes as needed.     oxyCODONE-acetaminophen 5-325 MG per tablet  Commonly known as:  ROXICET  Take 1 tablet by mouth every 4 (four) hours as needed for pain.     sulfamethoxazole-trimethoprim 800-160 MG per tablet  Commonly known as:  BACTRIM DS  Take 1 tablet by mouth every 12 (twelve) hours.     triamcinolone ointment 0.5 %  Commonly known as:  KENALOG  Apply 1 application topically daily.     vitamin B-12 500 MCG tablet  Commonly known as:  CYANOCOBALAMIN  Take 500 mcg by mouth daily.     Vitamin D3 1000 UNITS Caps  Take 1 capsule by mouth daily.           Follow-up Information   Follow up with Anner Crete, MD On 02/24/2013. (930)    Contact information:   [address]LO[address] [place]  [place] [address] [phone]       Signed: Anner Crete 02/17/2013, 6:40 AM

## 2013-02-17 NOTE — Op Note (Signed)
NAME:  [name], AGARD NO.:  [id number]  MEDICAL RECORD NO.:  [id number]  LOCATION:  1417                         FACILITY:  [facility]  PHYSICIAN:  Excell Seltzer. Annabell Howells, M.D.    DATE OF BIRTH:  February 06, 1946  DATE OF PROCEDURE:  02/16/2013 DATE OF DISCHARGE:                              OPERATIVE REPORT   PROCEDURE:  Removal and replacement of an AMS 700 inflatable penile prosthesis.  PREOPERATIVE DIAGNOSIS:  Malfunctioning penile prosthesis.  POSTOPERATIVE DIAGNOSIS:  Malfunctioning penile prosthesis.  SURGEON:  Excell Seltzer. Annabell Howells, M.D.  ASSISTANT:  Jerilee Field, MD  ANESTHESIA:  General.  SPECIMEN:  Explanted device including pump cylinders and reservoir.  BLOOD LOSS:  20 mL.  DRAINS:  A 16-French Foley catheter.  COMPLICATIONS:  None.  INDICATIONS:  [name] is a 67 year old white male with a history of penile prosthesis placed approximately 10 years ago for erectile dysfunction after treatment of prostate cancer.  Over the last several weeks, he has had a malfunction of the device and is no longer able to be inflated.  He is to undergo reimplantation.  His original device was an AMS 700 placed through penoscrotal approach in [facility].  PROCEDURE:  He was given vancomycin and gentamicin preoperatively.  He was taken to the operating room where general anesthetic was induced. He was placed in a supine position in slight frog-legged.  His genitalia and lower abdomen were clipped.  He was prepped with a Betadine scrub followed by Betadine solution and then draped in the usual sterile fashion.  A 16-French Foley catheter was inserted.  The balloon was filled with 10 mL sterile fluid and the bladder was drained.  The old penoscrotal incision was reopened with a knife.  The Bovie was then used to cut down on the palpable tubing.  The initial tubing encountered was from the pump to the reservoir and this was exposed down to the scrotal pump, which was then  removed from its capsule using Bovie cautery.  With the pump available to provide traction, the tubing to the cylinders down to the corpora were then dissected out, but the corpora not entered at this point in time.  I then followed the reservoir tubing back up to the inguinal area and removed the reservoir which was empty.  At this point, the left cylinder tubing was followed down and the left corpora was opened and the cylinder was removed.  The proximal end with a 2 cm rear tip extender came out easily, but the distal end it was noted that the outer silicone layer of the prosthesis had delaminated exposing the middle fabric layer and this had grown into some of the surrounding tissues and required gentle dissection and care to remove the entire cylinder as well as the remaining sheath.  Once the cylinder was removed, inspection revealed that the outer silicone sheath had detached distally but was otherwise intact.  At this point, the right cylinder was dissected out in a similar fashion, removed without difficulty.  It was not delaminated.  Once both cylinders have been removed, we noted some bleeding from the edge of the corpora on the left, this was controlled with cautery.  I then  placed 2-0 Vicryl stay sutures in the sides of the corporotomy for easy replacement of the cylinders.  A offset dilator 12 mm in size was used to probe the corpora proximally and distally to ensure there was no obstruction and that there was dilation of the tip and both spaces were in good shape upon removal of the prosthesis.  We then chose a AMS 700 LGX cylinder set 18 cm x 12 mm, and also chose the associated 2 cm rear-tip extenders.  The devices were coated with InhibiZone and the rear-tip extenders were soaked in antibiotic solution during the preparation.  The corporal cavities and the prior pump cavity were generously irrigated with antibiotic solution as was the reservoir cavity.  Once  the cylinders had been properly filled with normal saline.  They were brought onto the field.  A Keith needle was placed through the tethering stitch and placed in the [facility] inserter which was initially brought out through the left corpora.  This was then repeated on the right in the usual fashion.  Once both tethers were through the glans, the left cylinder was seated proximally and distally followed by the right cylinder.  Once both cylinders were in position, the device was inflated using a syringe as a surrogate reservoir.  He had excellent fat with a very satisfactory erection.  At this point, the cylinders were deflated and a corporotomies were closed with interrupted 2-0 Vicryl.  Once the corporotomy has been closed, a fresh dartos, sub dartos pouch was created for the reservoir just lateral to the prior pouch and the pump was placed in that position.  We then turned our attention to the reservoir cavity.  This was noted to have a fairly narrow opening into the retropubic space, and at first there was some difficulty sufficiently accessing this but I was able to get two ribbon retractors into the space and with those in position was able to dilate the entry into the space and dilate the space to some degree.  A 65 mL reservoir was then chosen and prepared and placed.  It was InhibiZone coated.  Once the reservoir was in position, it was inflated initially.  The position did not seem appropriate so it was adjusted and was placed somewhat deeper in the cavity and then filled. I overfilled it to 95 mL and continued to have some back pressure even at the proper volume was 65 mL.  The device was partially deflated and finger dissection was used to try to break down the capsule of the reservoir space.  Once this had been done with 65 mL in the reservoir there was no back pressure.  At this point, the pump and reservoir tubing were rubber shredded appropriately close to the devices and  the excess tubing was trimmed.  The tubing was flushed and the ends were joined with a straight quick connector. The closure tool was used to secure the connection.  Once the pump and reservoir were connected, the device was cycled once again with good result.  There did not appear to be any auto inflation.  At this point, a 3-0 Vicryl was used to separate and cover the tubing as best as possible using interrupted stitches.  The wound was irrigated once again with antibiotic solution. Superficial dartos was then closed using a running 3-0 Vicryl and the skin was closed with a running 4-0 Monocryl intracuticular stitch.  The wound was reinforced with Dermabond.  The tethering sutures were removed from the glans.  The wound was cleansed the dressing of Telfa, 4x4s, and a fluff Kerlix with athletic supporter was placed.  The Foley catheter, which had been drained prior to the reservoir placement was placed to straight drainage.  The patient's anesthetic was reversed.  He was moved to recovery in stable condition.  There were no complication.  The components implanted included AMS 700 LGX MS pump preconnect IZ kit, 18 cm x 12 mm with the reference of [id number] and a lot #[id number].  The AMS 700 reservoir InhibiZone reference [id number] and lot #[id number] 65 mL and finally the AMS 700 accessory kit reference #[id number] lot [id number]. The 2 cm rear-tip extenders are reference [id number] in lot [id number].     Excell Seltzer. Annabell Howells, M.D.     JJW/MEDQ  D:  02/16/2013  T:  02/17/2013  Job:  [id number]

## 2013-03-29 ENCOUNTER — Ambulatory Visit: Payer: Self-pay | Admitting: Family Medicine

## 2013-03-29 ENCOUNTER — Encounter: Payer: Self-pay | Admitting: Internal Medicine

## 2013-03-29 ENCOUNTER — Ambulatory Visit (INDEPENDENT_AMBULATORY_CARE_PROVIDER_SITE_OTHER): Payer: BC Managed Care – PPO | Admitting: Internal Medicine

## 2013-03-29 VITALS — BP 106/70 | HR 74 | Temp 98.4°F | Resp 18 | Wt 225.0 lb

## 2013-03-29 DIAGNOSIS — J329 Chronic sinusitis, unspecified: Secondary | ICD-10-CM

## 2013-03-29 DIAGNOSIS — I10 Essential (primary) hypertension: Secondary | ICD-10-CM

## 2013-03-29 NOTE — Patient Instructions (Signed)
Take over-the-counter expectorants and cough medications such as  Mucinex DM.  Call if there is no improvement in 5 to 7 days or if he developed worsening cough, fever, or new symptoms, such as shortness of breath or chest pain.  Use  a daily nonsedating anti-histamine such as Allegra or Zyrtec daily  Nasonex use daily

## 2013-03-29 NOTE — Progress Notes (Signed)
Subjective:    Patient ID: [name], male    DOB: 10-Sep-1946, 67 y.o.   MRN: [id number]  HPI   67 year old patient who has a history of hypertension coronary artery disease who presents with a ten-day history of sinus congestion cough and postnasal drip. There's been no fever he has been using Mucinex D with some improvement. No purulent drainage localize sinus pain wheezing or shortness of breath.  Past Medical History  Diagnosis Date  . HYPERLIPIDEMIA 06/14/2008  . GOUT 06/14/2008  . HYPERTENSION 02/02/2010  . CORONARY ARTERY DISEASE 01/12/2008    a. s/p post wall MI in 2004 => overlapping stents in the CFX (one Zeta stent and one Taxus DES);  b. [place] => LHC 2/11: Normal LM, normal LAD, normal Dx, CFX 80% ISR, PDA 30-40%, EF 55%. PCI: Cutting balloon angioplasty to the CFX ISR;  c.  [place] 5/12: inf and IL defect c/w prior MI, no ischemia, EF 54%  . Hx of echocardiogram 06/14/2008    a. echo 12/2009: mild LVH, EF 60-65%  . Cellulitis and abscess of other specified site 06/14/2008  . OSTEOARTHRITIS 01/12/2008    injection to spine on 04/01/11  . PROSTATE CANCER, HX OF 01/12/2008  . Former smoker 01/10/2010  . Lumbar disc disease 03/14/2011    hx of low back pain, paresthesia, leg pain  . S/P lumbar discectomy 03/14/2011  . S/P radiation therapy 10/02/11 - 11/25/11    Central Lower Pelvis: 6840 cGy/38 Fractions  . Diverticulosis   . Myocardial infarction 2004    3 stents  . Carpal tunnel syndrome 06/07/2008    slight  . Cancer     prostate 2012  . PONV (postoperative nausea and vomiting)   . Plantar fasciitis     History   Social History  . Marital Status: Married    Spouse Name: N/A    Number of Children: N/A  . Years of Education: N/A   Occupational History  . Not on file.   Social History Main Topics  . Smoking status: Former Smoker -- 1.50 packs/day for 40 years    Types: Cigarettes    Quit date: 11/28/2004  . Smokeless tobacco: Never Used     Comment: quit appox 7  yrs ago  . Alcohol Use: Yes     Comment: 2 drinks a day  . Drug Use: No  . Sexually Active: Yes   Other Topics Concern  . Not on file   Social History Narrative  . No narrative on file    Past Surgical History  Procedure Laterality Date  . Lumbar laminectomy    . Prostatectomy  2001  . Right ureteroscopic stone extraction with holmium lasertripsy and right ureteral stent insertion  9.1.2005  . Cystoscopy  07/19/2004    insertion of right double-J stent  . Repair of left inguinal hernia with mesh  11/02/2003    ([facility])  . Epidurography  04/01/11    Steroid Injection:Lumbar spondylosis/Advanced Facet Arthropathy:  . Back surgery  1994    discectomy/fusion  . Bone spur removed  1997    neck  . Penile prosthesis placement  2003  . Cardiac catheterization      3 stents  . Coronary angioplasty  2011  . Hernia repair    . Penile prosthesis implant N/A 02/16/2013    Procedure: REPLACEMENT OF AMS PENILE PROTHESIS INFLATABLE;  Surgeon: Anner Crete, MD;  Location: [facility];  Service: Urology;  Laterality: N/A;    Family  History  Problem Relation Age of Onset  . Hypertension Other     No Known Allergies  Current Outpatient Prescriptions on File Prior to Visit  Medication Sig Dispense Refill  . allopurinol (ZYLOPRIM) 300 MG tablet TAKE 1 TABLET BY MOUTH EVERY DAY  90 tablet  3  . aspirin 325 MG tablet Take 325 mg by mouth daily.       [name] Kitchen atorvastatin (LIPITOR) 80 MG tablet Take 40 mg by mouth every evening.      . Cholecalciferol (VITAMIN D3) 1000 UNITS CAPS Take 1 capsule by mouth daily.       [name] Kitchen ibuprofen (ADVIL,MOTRIN) 200 MG tablet Take 200 mg by mouth daily.      [name] Kitchen lisinopril (PRINIVIL,ZESTRIL) 40 MG tablet Take 20 mg by mouth every evening. Takes 1/2 tablet      . meloxicam (MOBIC) 15 MG tablet TAKE 1 TABLET (15 MG TOTAL) BY MOUTH DAILY AS NEEDED FOR PAIN.  90 tablet  3  . metoprolol tartrate (LOPRESSOR) 25 MG tablet Take 12.5 mg by mouth every evening.      .  Multiple Vitamin (MULTIVITAMIN WITH MINERALS) TABS Take 1 tablet by mouth daily.      . nitroGLYCERIN (NITROSTAT) 0.4 MG SL tablet Place 0.4 mg under the tongue every 5 (five) minutes as needed.       . Omega-3 Fatty Acids (FISH OIL) 1000 MG CAPS Take 1 capsule by mouth daily.       [name] Kitchen triamcinolone ointment (KENALOG) 0.5 % Apply 1 application topically daily.      . vitamin B-12 (CYANOCOBALAMIN) 500 MCG tablet Take 500 mcg by mouth daily.       No current facility-administered medications on file prior to visit.    BP 106/70  Pulse 74  Temp(Src) 98.4 F (36.9 C) (Oral)  Resp 18  Wt 225 lb (102.059 kg)  BMI 31.39 kg/m2  SpO2 98%       Review of Systems  Constitutional: Negative for fever, chills, appetite change and fatigue.  HENT: Positive for congestion, rhinorrhea and postnasal drip. Negative for hearing loss, ear pain, sore throat, trouble swallowing, neck stiffness, dental problem, voice change and tinnitus.   Eyes: Negative for pain, discharge and visual disturbance.  Respiratory: Positive for cough. Negative for chest tightness, wheezing and stridor.   Cardiovascular: Negative for chest pain, palpitations and leg swelling.  Gastrointestinal: Negative for nausea, vomiting, abdominal pain, diarrhea, constipation, blood in stool and abdominal distention.  Genitourinary: Negative for urgency, hematuria, flank pain, discharge, difficulty urinating and genital sores.  Musculoskeletal: Negative for myalgias, back pain, joint swelling, arthralgias and gait problem.  Skin: Negative for rash.  Neurological: Negative for dizziness, syncope, speech difficulty, weakness, numbness and headaches.  Hematological: Negative for adenopathy. Does not bruise/bleed easily.  Psychiatric/Behavioral: Negative for behavioral problems and dysphoric mood. The patient is not nervous/anxious.        Objective:   Physical Exam  Constitutional: He is oriented to person, place, and time. He appears  well-developed.  Afebrile No distress Repeat blood pressure 118/76  HENT:  Head: Normocephalic.  Right Ear: External ear normal.  Left Ear: External ear normal.  No focal sinus tenderness  Eyes: Conjunctivae and EOM are normal.  Neck: Normal range of motion.  Cardiovascular: Normal rate and normal heart sounds.   Pulmonary/Chest: Effort normal and breath sounds normal. No respiratory distress. He has no wheezes. He has no rales.  Abdominal: Bowel sounds are normal.  Musculoskeletal: Normal range of motion. He  exhibits no edema and no tenderness.  Neurological: He is alert and oriented to person, place, and time.  Psychiatric: He has a normal mood and affect. His behavior is normal.          Assessment & Plan:   Viral URI with cough. More likely viral syndrome. Patient has no history of seasonal allergic rhinitis. Will add a once daily nonsedating antihistamine and provide samples of Nasonex Hypertension stable

## 2013-04-14 ENCOUNTER — Other Ambulatory Visit: Payer: BC Managed Care – PPO

## 2013-04-14 DIAGNOSIS — E785 Hyperlipidemia, unspecified: Secondary | ICD-10-CM

## 2013-04-14 LAB — BASIC METABOLIC PANEL
CO2: 28 mEq/L (ref 19–32)
Calcium: 9.3 mg/dL (ref 8.4–10.5)
Glucose, Bld: 102 mg/dL — ABNORMAL HIGH (ref 70–99)
Potassium: 4.5 mEq/L (ref 3.5–5.1)
Sodium: 140 mEq/L (ref 135–145)

## 2013-04-14 LAB — LIPID PANEL
HDL: 57.5 mg/dL (ref >39.00)
Total CHOL/HDL Ratio: 3
VLDL: 26 mg/dL (ref 0.0–40.0)

## 2013-04-14 LAB — HEPATIC FUNCTION PANEL: Total Bilirubin: 0.9 mg/dL (ref 0.3–1.2)

## 2013-04-16 ENCOUNTER — Ambulatory Visit (INDEPENDENT_AMBULATORY_CARE_PROVIDER_SITE_OTHER): Payer: BC Managed Care – PPO | Admitting: Cardiology

## 2013-04-16 ENCOUNTER — Encounter: Payer: Self-pay | Admitting: Cardiology

## 2013-04-16 ENCOUNTER — Telehealth: Payer: Self-pay | Admitting: *Deleted

## 2013-04-16 VITALS — BP 130/82 | HR 84 | Ht 71.5 in | Wt 219.8 lb

## 2013-04-16 DIAGNOSIS — R079 Chest pain, unspecified: Secondary | ICD-10-CM

## 2013-04-16 DIAGNOSIS — R0789 Other chest pain: Secondary | ICD-10-CM

## 2013-04-16 NOTE — Patient Instructions (Addendum)
Your physician recommends that you continue on your current medications as directed. Please refer to the Current Medication list given to you today.  Your physician wants you to follow-up in: 1 year with Dr. Chris McAlhany.  You will receive a reminder letter in the mail two months in advance. If you don't receive a letter, please call our office to schedule the follow-up appointment.  

## 2013-04-16 NOTE — Assessment & Plan Note (Signed)
This is noncardiac. Reassurance given. Continue secondary preventative therapy. Return in one year to see Dr. Sanjuana Kava.

## 2013-04-16 NOTE — Progress Notes (Signed)
HPI [name] returns today for evaluation and management of some chest discomfort. He has a history of coronary artery disease with inferior Kenidi Elenbaas infarct in 2004. He had overlapping stents at that time. He is also having cutting balloon angioplasty to circumflex in-stent restenosis. His last ejection fraction was 54% by a nonischemic Myoview.  He's been having some intermittent aching in the left chest and shoulder. Usually occurs when he is driving his car or at rest. He still very active working Holiday representative jobs and has no exertional chest discomfort. EKG today is stable.  Past Medical History  Diagnosis Date  . HYPERLIPIDEMIA 06/14/2008  . GOUT 06/14/2008  . HYPERTENSION 02/02/2010  . CORONARY ARTERY DISEASE 01/12/2008    a. s/p post Denzel Etienne MI in 2004 => overlapping stents in the CFX (one Zeta stent and one Taxus DES);  b. [place] => LHC 2/11: Normal LM, normal LAD, normal Dx, CFX 80% ISR, PDA 30-40%, EF 55%. PCI: Cutting balloon angioplasty to the CFX ISR;  c.  [facility] MV 5/12: inf and IL defect c/w prior MI, no ischemia, EF 54%  . Hx of echocardiogram 06/14/2008    a. echo 12/2009: mild LVH, EF 60-65%  . Cellulitis and abscess of other specified site 06/14/2008  . OSTEOARTHRITIS 01/12/2008    injection to spine on 04/01/11  . PROSTATE CANCER, HX OF 01/12/2008  . Former smoker 01/10/2010  . Lumbar disc disease 03/14/2011    hx of low back pain, paresthesia, leg pain  . S/P lumbar discectomy 03/14/2011  . S/P radiation therapy 10/02/11 - 11/25/11    Central Lower Pelvis: 6840 cGy/38 Fractions  . Diverticulosis   . Myocardial infarction 2004    3 stents  . Carpal tunnel syndrome 06/07/2008    slight  . Cancer     prostate 2012  . PONV (postoperative nausea and vomiting)   . Plantar fasciitis     Current Outpatient Prescriptions  Medication Sig Dispense Refill  . allopurinol (ZYLOPRIM) 300 MG tablet TAKE 1 TABLET BY MOUTH EVERY DAY  90 tablet  3  . aspirin 325 MG tablet Take 325 mg by mouth daily.        [name] Kitchen atorvastatin (LIPITOR) 80 MG tablet Take 40 mg by mouth every evening.      . Cholecalciferol (VITAMIN D3) 1000 UNITS CAPS Take 1 capsule by mouth daily.       [name] Kitchen gabapentin (NEURONTIN) 100 MG capsule Take 300 mg by mouth at bedtime.      [name] Kitchen ibuprofen (ADVIL,MOTRIN) 200 MG tablet Take 200 mg by mouth daily.      [name] Kitchen lisinopril (PRINIVIL,ZESTRIL) 40 MG tablet Take 20 mg by mouth every evening. Takes 1/2 tablet      . meloxicam (MOBIC) 15 MG tablet TAKE 1 TABLET (15 MG TOTAL) BY MOUTH DAILY AS NEEDED FOR PAIN.  90 tablet  3  . metoprolol tartrate (LOPRESSOR) 25 MG tablet Take 12.5 mg by mouth every evening.      . Multiple Vitamin (MULTIVITAMIN WITH MINERALS) TABS Take 1 tablet by mouth daily.      . nitroGLYCERIN (NITROSTAT) 0.4 MG SL tablet Place 0.4 mg under the tongue every 5 (five) minutes as needed.       . Omega-3 Fatty Acids (FISH OIL) 1000 MG CAPS Take 1 capsule by mouth daily.       [name] Kitchen triamcinolone ointment (KENALOG) 0.5 % Apply 1 application topically daily.      . vitamin B-12 (CYANOCOBALAMIN) 500 MCG tablet Take 500 mcg  by mouth daily.       No current facility-administered medications for this visit.    No Known Allergies  Family History  Problem Relation Age of Onset  . Hypertension Other     History   Social History  . Marital Status: Married    Spouse Name: N/A    Number of Children: N/A  . Years of Education: N/A   Occupational History  . Not on file.   Social History Main Topics  . Smoking status: Former Smoker -- 1.50 packs/day for 40 years    Types: Cigarettes    Quit date: 11/28/2004  . Smokeless tobacco: Never Used     Comment: quit appox 7 yrs ago  . Alcohol Use: Yes     Comment: 2 drinks a day  . Drug Use: No  . Sexually Active: Yes   Other Topics Concern  . Not on file   Social History Narrative  . No narrative on file    ROS ALL NEGATIVE EXCEPT THOSE NOTED IN HPI  PE  General Appearance: well developed, well nourished in no acute  distress HEENT: symmetrical face, PERRLA, good dentition  Neck: no JVD, thyromegaly, or adenopathy, trachea midline Chest: symmetric without deformity Cardiac: PMI non-displaced, RRR, normal S1, S2, no gallop or murmur Lung: clear to ausculation and percussion Vascular: all pulses full without bruits  Abdominal: nondistended, nontender, good bowel sounds, no HSM, no bruits Extremities: no cyanosis, clubbing or edema, no sign of DVT, no varicosities  Skin: normal color, no rashes Neuro: alert and oriented x 3, non-focal Pysch: normal affect  EKG Normal sinus rhythm, no acute changes. BMET    Component Value Date/Time   NA 140 04/14/2013 0947   K 4.5 04/14/2013 0947   CL 105 04/14/2013 0947   CO2 28 04/14/2013 0947   GLUCOSE 102* 04/14/2013 0947   BUN 20 04/14/2013 0947   CREATININE 1.2 04/14/2013 0947   CALCIUM 9.3 04/14/2013 0947   GFRNONAA 63* 02/10/2013 1015   GFRAA 73* 02/10/2013 1015    Lipid Panel     Component Value Date/Time   CHOL 157 04/14/2013 0947   TRIG 130.0 04/14/2013 0947   HDL 57.50 04/14/2013 0947   CHOLHDL 3 04/14/2013 0947   VLDL 26.0 04/14/2013 0947   LDLCALC 74 04/14/2013 0947    CBC    Component Value Date/Time   WBC 6.7 02/10/2013 1015   RBC 4.88 02/10/2013 1015   HGB 15.1 02/10/2013 1015   HCT 44.9 02/10/2013 1015   PLT 156 02/10/2013 1015   MCV 92.0 02/10/2013 1015   MCH 30.9 02/10/2013 1015   MCHC 33.6 02/10/2013 1015   RDW 12.5 02/10/2013 1015   LYMPHSABS 1.6 01/22/2010 0640   MONOABS 0.5 01/22/2010 0640   EOSABS 0.3 01/22/2010 0640   BASOSABS 0.1 01/22/2010 0640

## 2013-04-16 NOTE — Telephone Encounter (Signed)
I spoke with pt and he is aware his [facility] paperwork is ready for pickup. He will pick this up on Wednesday 04/21/13 Mylo Red RN

## 2013-06-03 ENCOUNTER — Ambulatory Visit (INDEPENDENT_AMBULATORY_CARE_PROVIDER_SITE_OTHER): Payer: BC Managed Care – PPO | Admitting: Internal Medicine

## 2013-06-03 ENCOUNTER — Encounter: Payer: Self-pay | Admitting: Internal Medicine

## 2013-06-03 VITALS — BP 118/70 | HR 68 | Temp 97.6°F | Resp 16 | Wt 220.0 lb

## 2013-06-03 DIAGNOSIS — N189 Chronic kidney disease, unspecified: Secondary | ICD-10-CM

## 2013-06-03 DIAGNOSIS — M519 Unspecified thoracic, thoracolumbar and lumbosacral intervertebral disc disorder: Secondary | ICD-10-CM

## 2013-06-03 DIAGNOSIS — M545 Low back pain: Secondary | ICD-10-CM

## 2013-06-03 DIAGNOSIS — E785 Hyperlipidemia, unspecified: Secondary | ICD-10-CM

## 2013-06-03 DIAGNOSIS — I10 Essential (primary) hypertension: Secondary | ICD-10-CM

## 2013-06-03 DIAGNOSIS — I2510 Atherosclerotic heart disease of native coronary artery without angina pectoris: Secondary | ICD-10-CM

## 2013-06-03 MED ORDER — TRIAMCINOLONE ACETONIDE 0.5 % EX CREA: TOPICAL_CREAM | Freq: Three times a day (TID) | CUTANEOUS | Status: DC

## 2013-06-03 NOTE — Assessment & Plan Note (Signed)
Continue with current prescription therapy as reflected on the Med list.  

## 2013-06-03 NOTE — Progress Notes (Signed)
   Subjective:    HPI  F/u LBP - better with exercise  The patient presents for a follow-up of  chronic hypertension, chronic dyslipidemia, OA, CAD controlled with medicines. He finished XRT, Dr Melissa Noon has d/c'd him. Seeing Dr Annabell Howells q 6 mo. Chol med was changed by [facility] Readings from Last 3 Encounters:  06/03/13 220 lb (99.791 kg)  04/16/13 219 lb 12.8 oz (99.701 kg)  03/29/13 225 lb (102.059 kg)   BP Readings from Last 3 Encounters:  06/03/13 118/70  04/16/13 130/82  03/29/13 106/70      Review of Systems  Constitutional: Negative for appetite change, fatigue and unexpected weight change.  HENT: Negative for nosebleeds and trouble swallowing.   Eyes: Negative for itching and visual disturbance.  Cardiovascular: Negative for chest pain, palpitations and leg swelling.  Gastrointestinal: Negative for nausea, diarrhea, blood in stool and abdominal distention.  Genitourinary: Negative for frequency and hematuria.  Musculoskeletal: Negative for back pain, joint swelling and gait problem.  Skin: Negative for rash.  Neurological: Negative for dizziness, tremors, speech difficulty and weakness.  Psychiatric/Behavioral: Negative for sleep disturbance, dysphoric mood and agitation. The patient is not nervous/anxious.        Objective:   Physical Exam  Constitutional: He is oriented to person, place, and time. He appears well-developed.  obese  HENT:  Mouth/Throat: Oropharynx is clear and moist.  Eyes: Conjunctivae are normal. Pupils are equal, round, and reactive to light.  Neck: Normal range of motion. No JVD present. No thyromegaly present.  Cardiovascular: Normal rate, regular rhythm, normal heart sounds and intact distal pulses.  Exam reveals no gallop and no friction rub.   No murmur heard. Pulmonary/Chest: Effort normal and breath sounds normal. No respiratory distress. He has no wheezes. He has no rales. He exhibits no tenderness.  Abdominal: Soft. Bowel sounds are  normal. He exhibits no distension and no mass. There is no tenderness. There is no rebound and no guarding.  Musculoskeletal: Normal range of motion. He exhibits no edema and no tenderness.  Lymphadenopathy:    He has no cervical adenopathy.  Neurological: He is alert and oriented to person, place, and time. He has normal reflexes. No cranial nerve deficit. He exhibits normal muscle tone. Coordination normal.  Skin: Skin is warm and dry. No rash noted.  Psychiatric: He has a normal mood and affect. His behavior is normal. Judgment and thought content normal.     Lab Results  Component Value Date   WBC 6.7 02/10/2013   HGB 15.1 02/10/2013   HCT 44.9 02/10/2013   PLT 156 02/10/2013   GLUCOSE 102* 04/14/2013   CHOL 157 04/14/2013   TRIG 130.0 04/14/2013   HDL 57.50 04/14/2013   LDLCALC 74 04/14/2013   ALT 21 04/14/2013   AST 45* 04/14/2013   NA 140 04/14/2013   K 4.5 04/14/2013   CL 105 04/14/2013   CREATININE 1.2 04/14/2013   BUN 20 04/14/2013   CO2 28 04/14/2013   TSH 1.82 08/09/2011   INR 1.12 01/19/2010   HGBA1C 5.8 08/09/2011         Assessment & Plan:

## 2013-06-03 NOTE — Assessment & Plan Note (Signed)
Exercising

## 2013-06-03 NOTE — Assessment & Plan Note (Signed)
BP Readings from Last 3 Encounters:  06/03/13 118/70  04/16/13 130/82  03/29/13 106/70

## 2013-06-03 NOTE — Assessment & Plan Note (Signed)
Watching labs 

## 2013-06-30 ENCOUNTER — Other Ambulatory Visit: Payer: Self-pay

## 2013-07-08 ENCOUNTER — Telehealth: Payer: Self-pay | Admitting: Radiation Oncology

## 2013-07-08 NOTE — Telephone Encounter (Signed)
Pt. Requested records.  Faxed release to [phone].  Received confirmation.

## 2013-08-18 ENCOUNTER — Other Ambulatory Visit: Payer: Self-pay | Admitting: Urology

## 2013-08-25 ENCOUNTER — Encounter (HOSPITAL_COMMUNITY): Payer: Self-pay | Admitting: Pharmacy Technician

## 2013-08-27 ENCOUNTER — Encounter (HOSPITAL_COMMUNITY): Payer: Self-pay | Admitting: Pharmacy Technician

## 2013-09-15 ENCOUNTER — Encounter (HOSPITAL_COMMUNITY): Payer: Self-pay | Admitting: *Deleted

## 2013-09-15 NOTE — H&P (Signed)
ctive Problems Problems  1. Lumbago 724.2 2. Nephrolithiasis Of Both Kidneys 592.0 3. History of  Organic Impotence 607.84 4. Prostate Cancer 185  History of Present Illness     [name] returns today in f/u.  He has a history of prostate cancer treated with radical prostatectomy and then salvage therapy with RT 1.5 years ago.  His last PSA was 0.07 in 4/14 which was declining.   He has ED and has an IPP that required replacement in 4/14 and it is working well for him.  He has known left renal stones and had gross hematuria about a month ago for 3 days.  He has not seen any since but has microhematuria on UA today.  He has no flank pain but has had some bladder pressure.  He is voiding well with good continence, but he voids q2-3 hrs day and night.   Past Medical History Problems  1. History of  Acute Myocardial Infarction V12.59 2. History of  Cancer 199.1 3. History of  Gout 274.9 4. History of  Gross Hematuria 599.71 5. History of  Heart Disease 429.9 6. History of  Hypercholesterolemia 272.0 7. History of  Hypertension 401.9 8. History of  Organic Impotence 607.84 9. History of  Prostate Cancer V10.46 10. History of  Ureteral Stone Left 592.1  Surgical History Problems  1. History of  Back Surgery 2. History of  Back Surgery 3. History of  Cystoscopy With Ureteroscopy Right 4. History of  Neck Surgery 5. History of  Prostatectomy Retropubic Radical With Lymph Node Biopsy(S) 6. History of  Surg Penis Insertion Of Penile Prosthesis 7. History of  Surg Penis Replacement Of Inflatable Penile Prosthesis  Current Meds 1. Allopurinol 300 MG Oral Tablet; Therapy: 24Jan2008 to 2. Aspirin 325 MG Oral Tablet; Therapy: (Recorded:23Sep2014) to 3. Atorvastatin Calcium 80 MG Oral Tablet; Therapy: (Recorded:23Sep2014) to 4. Centrum Silver TABS; Therapy: (Recorded:09Apr2012) to 5. Fish Oil CAPS; Therapy: (Recorded:09Apr2012) to 6. Hydrocodone-Acetaminophen TABS; Therapy:  (Recorded:10Oct2012) to 7. Ibuprofen TABS; Therapy: (Recorded:16Oct2013) to 8. Lisinopril 40 MG Oral Tablet; Therapy: (Recorded:23Sep2014) to 9. Meloxicam 15 MG Oral Tablet; Therapy: (Recorded:10Oct2012) to 10. Metoprolol Tartrate 25 MG Oral Tablet; Therapy: (Recorded:23Sep2014) to 11. Neurontin 100 MG Oral Capsule; Therapy: (Recorded:23Sep2014) to 12. Nitrostat 0.4 MG Sublingual Tablet Sublingual; Therapy: 01Mar2011 to 13. Omega-3 CAPS; Therapy: (Recorded:23Sep2014) to 14. Pravastatin Sodium 80 MG Oral Tablet; Therapy: 26Oct2011 to 15. Triamcinolone Acetonide 0.5 % External Cream; Therapy: (Recorded:23Sep2014) to 16. Vitamin B-12 TABS; Therapy: (Recorded:23Sep2014) to 17. Vitamin D TABS; Therapy: (Recorded:09Apr2012) to  Allergies Medication  1. No Known Drug Allergies No Known Allergies  2. No Known Allergies  Family History Problems  1. Family history of  Death In The Family Father died age 84-heart disease 2. Family history of  Death In The Family Mother died 45-cirrosis of liver 3. Paternal history of  Heart Disease V17.49  Social History Problems  1. Alcohol Use 2. Caffeine Use 3. Marital History - Currently Married 4. Occupation: Designer, industrial/product 5. Tobacco Use V15.82 quit 7+ yrs ago   Past and social history reviewed and updated.   He is disabled through the [place].   Review of Systems Genitourinary, constitutional, skin, eye, otolaryngeal, hematologic/lymphatic, cardiovascular, pulmonary, endocrine, musculoskeletal, gastrointestinal, neurological and psychiatric system(s) were reviewed and pertinent findings if present are noted.  Genitourinary: urinary frequency (q2-3hrs), nocturia (3x) and hematuria.  Gastrointestinal: no flank pain, no diarrhea and no constipation.  Constitutional: no recent weight loss.    Vitals Vital Signs [Data Includes: Last  1 Day]  23Sep2014 02:48PM  Blood Pressure: 109 / 70 Temperature: 97.6 F Heart Rate: 87  Physical  Exam Constitutional: Well nourished and well developed . No acute distress.  Pulmonary: No respiratory distress and normal respiratory rhythm and effort.  Cardiovascular: Heart rate and rhythm are normal . No peripheral edema.    Results/Data Urine [Data Includes: Last 1 Day]   23Sep2014  COLOR YELLOW   APPEARANCE CLEAR   SPECIFIC GRAVITY 1.020   pH 5.5   GLUCOSE NEG mg/dL  BILIRUBIN NEG   KETONE NEG mg/dL  BLOOD LARGE   PROTEIN NEG mg/dL  UROBILINOGEN 0.2 mg/dL  NITRITE NEG   LEUKOCYTE ESTERASE SMALL   SQUAMOUS EPITHELIAL/HPF NONE SEEN   WBC 3-6 WBC/hpf  RBC 21-50 RBC/hpf  BACTERIA NONE SEEN   CRYSTALS NONE SEEN   CASTS NONE SEEN    The following images/tracing/specimen were independently visualized:  KUB today shows a 13x85mm left UPJ stone with a 12mm LLP stone and 8mm LMP stone. There is an 8mm RUP stone as well. He has some lumbar arthritis but the film is otherwise unremarkable.  The following clinical lab reports were reviewed:  UA reviewed along with PSA's.    Assessment Assessed  1. Nephrolithiasis Of The Left Kidney 592.0 2. Gross Hematuria 599.71 3. History of  Organic Impotence 607.84 4. Prostate Cancer 185 5. Nephrolithiasis Of Both Kidneys 592.0   He has hematuria from a left UPJ stone but no pain. He is due for a PSA. His prosthesis is working well.   Plan Health Maintenance (V70.0)  1. UA With REFLEX  Done: 23Sep2014 02:32PM Nephrolithiasis Of Both Kidneys (592.0)  2. KUB  Done: 23Sep2014 12:00AM Nephrolithiasis Of The Left Kidney (592.0)  3. TraMADol HCl 50 MG Oral Tablet; 1 po q4-6 hours prn for pain; Therapy: 23Sep2014 to (Last  Rx:23Sep2014) 4. Follow-up Schedule Surgery Office  Follow-up  Requested for: 23Sep2014 Prostate Cancer (185)  5. PSA  Requested for: 23Sep2014   I discussed options and sent a script for tramadol and will get him set up for ESWL.  I reviewed the risks in detail and he has had the procedure before.  PSA today.

## 2013-09-16 ENCOUNTER — Encounter (HOSPITAL_COMMUNITY): Payer: Self-pay

## 2013-09-16 ENCOUNTER — Ambulatory Visit (HOSPITAL_COMMUNITY): Payer: BC Managed Care – PPO

## 2013-09-16 ENCOUNTER — Ambulatory Visit (HOSPITAL_COMMUNITY)
Admission: RE | Admit: 2013-09-16 | Discharge: 2013-09-16 | Disposition: A | Discharge status: Home / Self Care | Payer: BC Managed Care – PPO | Source: Ambulatory Visit | Attending: Urology | Admitting: Urology

## 2013-09-16 ENCOUNTER — Encounter (HOSPITAL_COMMUNITY)
Admission: RE | Disposition: A | Discharge status: Home / Self Care | Payer: Self-pay | Source: Ambulatory Visit | Attending: Urology

## 2013-09-16 DIAGNOSIS — Z79899 Other long term (current) drug therapy: Secondary | ICD-10-CM | POA: Insufficient documentation

## 2013-09-16 DIAGNOSIS — Z7982 Long term (current) use of aspirin: Secondary | ICD-10-CM | POA: Insufficient documentation

## 2013-09-16 DIAGNOSIS — I252 Old myocardial infarction: Secondary | ICD-10-CM | POA: Insufficient documentation

## 2013-09-16 DIAGNOSIS — I10 Essential (primary) hypertension: Secondary | ICD-10-CM | POA: Insufficient documentation

## 2013-09-16 DIAGNOSIS — E7800 Pure hypercholesterolemia, unspecified: Secondary | ICD-10-CM | POA: Insufficient documentation

## 2013-09-16 DIAGNOSIS — R310 Gross hematuria: Secondary | ICD-10-CM | POA: Insufficient documentation

## 2013-09-16 DIAGNOSIS — C61 Malignant neoplasm of prostate: Secondary | ICD-10-CM | POA: Insufficient documentation

## 2013-09-16 DIAGNOSIS — N200 Calculus of kidney: Secondary | ICD-10-CM | POA: Insufficient documentation

## 2013-09-16 DIAGNOSIS — N529 Male erectile dysfunction, unspecified: Secondary | ICD-10-CM | POA: Insufficient documentation

## 2013-09-16 HISTORY — PX: EXTRACORPOREAL SHOCK WAVE LITHOTRIPSY: SHX1557

## 2013-09-16 SURGERY — LITHOTRIPSY, ESWL
Anesthesia: LOCAL | Laterality: Left

## 2013-09-16 MED ORDER — DIAZEPAM 5 MG PO TABS
10.0000 mg | ORAL_TABLET | ORAL | Status: AC
  Administered 2013-09-16: 10 mg via ORAL
  Filled 2013-09-16: qty 2

## 2013-09-16 MED ORDER — CIPROFLOXACIN HCL 500 MG PO TABS
500.0000 mg | ORAL_TABLET | ORAL | Status: AC
Start: 2013-09-16 — End: 2013-09-16
  Administered 2013-09-16: 500 mg via ORAL
  Filled 2013-09-16 (×2): qty 1

## 2013-09-16 MED ORDER — OXYCODONE-ACETAMINOPHEN 5-325 MG PO TABS: 1.0000 | ORAL_TABLET | ORAL | Status: DC | PRN

## 2013-09-16 MED ORDER — SODIUM CHLORIDE 0.9 % IV SOLN: 250.0000 mL | INTRAVENOUS | Status: DC | PRN

## 2013-09-16 MED ORDER — DEXTROSE-NACL 5-0.45 % IV SOLN
INTRAVENOUS | Status: DC
  Administered 2013-09-16: 11:00:00 via INTRAVENOUS

## 2013-09-16 MED ORDER — DIPHENHYDRAMINE HCL 25 MG PO CAPS
25.0000 mg | ORAL_CAPSULE | ORAL | Status: AC
  Administered 2013-09-16: 25 mg via ORAL
  Filled 2013-09-16: qty 1

## 2013-09-16 NOTE — Interval H&P Note (Signed)
History and Physical Interval Note:  09/16/2013 12:37 PM  [name]  has presented today for surgery, with the diagnosis of Left Ureteral Pelvic Junction Stone  The various methods of treatment have been discussed with the patient and family. After consideration of risks, benefits and other options for treatment, the patient has consented to  Procedure(s): LEFT EXTRACORPOREAL SHOCK WAVE LITHOTRIPSY (ESWL) (Left) as a surgical intervention .  The patient's history has been reviewed, patient examined, no change in status, stable for surgery.  I have reviewed the patient's chart and labs.  Questions were answered to the patient's satisfaction.     Rosaire Cueto J

## 2013-09-30 ENCOUNTER — Other Ambulatory Visit: Payer: Self-pay

## 2013-10-06 ENCOUNTER — Ambulatory Visit (INDEPENDENT_AMBULATORY_CARE_PROVIDER_SITE_OTHER): Payer: BC Managed Care – PPO | Admitting: Internal Medicine

## 2013-10-06 ENCOUNTER — Encounter: Payer: Self-pay | Admitting: Internal Medicine

## 2013-10-06 ENCOUNTER — Other Ambulatory Visit (INDEPENDENT_AMBULATORY_CARE_PROVIDER_SITE_OTHER): Payer: BC Managed Care – PPO

## 2013-10-06 VITALS — BP 102/72 | HR 68 | Temp 98.0°F | Resp 16 | Wt 223.0 lb

## 2013-10-06 DIAGNOSIS — M545 Low back pain: Secondary | ICD-10-CM

## 2013-10-06 DIAGNOSIS — Z23 Encounter for immunization: Secondary | ICD-10-CM

## 2013-10-06 DIAGNOSIS — IMO0002 Reserved for concepts with insufficient information to code with codable children: Secondary | ICD-10-CM

## 2013-10-06 DIAGNOSIS — I2510 Atherosclerotic heart disease of native coronary artery without angina pectoris: Secondary | ICD-10-CM

## 2013-10-06 DIAGNOSIS — N189 Chronic kidney disease, unspecified: Secondary | ICD-10-CM

## 2013-10-06 DIAGNOSIS — M5416 Radiculopathy, lumbar region: Secondary | ICD-10-CM

## 2013-10-06 DIAGNOSIS — I10 Essential (primary) hypertension: Secondary | ICD-10-CM

## 2013-10-06 LAB — BASIC METABOLIC PANEL
BUN: 18 mg/dL (ref 6–23)
CO2: 30 mEq/L (ref 19–32)
Calcium: 9.3 mg/dL (ref 8.4–10.5)
Chloride: 104 mEq/L (ref 96–112)
Creatinine, Ser: 1.4 mg/dL (ref 0.4–1.5)
GFR: 56.03 mL/min — ABNORMAL LOW (ref >60.00)

## 2013-10-06 MED ORDER — NITROGLYCERIN 0.4 MG SL SUBL: 0.4000 mg | SUBLINGUAL_TABLET | SUBLINGUAL | Status: AC | PRN

## 2013-10-06 NOTE — Assessment & Plan Note (Signed)
Continue with current prescription therapy as reflected on the Med list.  

## 2013-10-06 NOTE — Progress Notes (Signed)
   Subjective:    HPI    The patient presents for a follow-up of  chronic hypertension, chronic dyslipidemia, OA, CAD controlled with medicines. He finished XRT, Dr Melissa Noon has d/c'd him. Seeing Dr Annabell Howells q 6 mo. Chol med was changed by [place]  He had lithotripsy last month  Wt Readings from Last 3 Encounters:  10/06/13 223 lb (101.152 kg)  09/16/13 215 lb 2 oz (97.58 kg)  09/16/13 215 lb 2 oz (97.58 kg)   BP Readings from Last 3 Encounters:  10/06/13 102/72  09/16/13 134/92  09/16/13 134/92      Review of Systems  Constitutional: Negative for appetite change, fatigue and unexpected weight change.  HENT: Negative for nosebleeds and trouble swallowing.   Eyes: Negative for itching and visual disturbance.  Cardiovascular: Negative for chest pain, palpitations and leg swelling.  Gastrointestinal: Negative for nausea, diarrhea, blood in stool and abdominal distention.  Genitourinary: Negative for frequency and hematuria.  Musculoskeletal: Negative for back pain, gait problem and joint swelling.  Skin: Negative for rash.  Neurological: Negative for dizziness, tremors, speech difficulty and weakness.  Psychiatric/Behavioral: Negative for sleep disturbance, dysphoric mood and agitation. The patient is not nervous/anxious.        Objective:   Physical Exam  Constitutional: He is oriented to person, place, and time. He appears well-developed.  obese  HENT:  Mouth/Throat: Oropharynx is clear and moist.  Eyes: Conjunctivae are normal. Pupils are equal, round, and reactive to light.  Neck: Normal range of motion. No JVD present. No thyromegaly present.  Cardiovascular: Normal rate, regular rhythm, normal heart sounds and intact distal pulses.  Exam reveals no gallop and no friction rub.   No murmur heard. Pulmonary/Chest: Effort normal and breath sounds normal. No respiratory distress. He has no wheezes. He has no rales. He exhibits no tenderness.  Abdominal: Soft. Bowel sounds are  normal. He exhibits no distension and no mass. There is no tenderness. There is no rebound and no guarding.  Musculoskeletal: Normal range of motion. He exhibits no edema and no tenderness.  Lymphadenopathy:    He has no cervical adenopathy.  Neurological: He is alert and oriented to person, place, and time. He has normal reflexes. No cranial nerve deficit. He exhibits normal muscle tone. Coordination normal.  Skin: Skin is warm and dry. No rash noted.  Psychiatric: He has a normal mood and affect. His behavior is normal. Judgment and thought content normal.     Lab Results  Component Value Date   WBC 6.7 02/10/2013   HGB 15.1 02/10/2013   HCT 44.9 02/10/2013   PLT 156 02/10/2013   GLUCOSE 102* 04/14/2013   CHOL 157 04/14/2013   TRIG 130.0 04/14/2013   HDL 57.50 04/14/2013   LDLCALC 74 04/14/2013   ALT 21 04/14/2013   AST 45* 04/14/2013   NA 140 04/14/2013   K 4.5 04/14/2013   CL 105 04/14/2013   CREATININE 1.2 04/14/2013   BUN 20 04/14/2013   CO2 28 04/14/2013   TSH 1.82 08/09/2011   INR 1.12 01/19/2010   HGBA1C 5.8 08/09/2011         Assessment & Plan:

## 2013-10-06 NOTE — Assessment & Plan Note (Signed)
Labs

## 2013-10-06 NOTE — Assessment & Plan Note (Signed)
Better  

## 2014-01-14 ENCOUNTER — Encounter: Payer: Self-pay | Admitting: Internal Medicine

## 2014-01-14 ENCOUNTER — Ambulatory Visit (INDEPENDENT_AMBULATORY_CARE_PROVIDER_SITE_OTHER): Payer: BC Managed Care – PPO | Admitting: Internal Medicine

## 2014-01-14 VITALS — BP 104/62 | HR 72 | Temp 97.2°F | Resp 16 | Wt 223.0 lb

## 2014-01-14 DIAGNOSIS — J069 Acute upper respiratory infection, unspecified: Secondary | ICD-10-CM

## 2014-01-14 MED ORDER — PROMETHAZINE-CODEINE 6.25-10 MG/5ML PO SYRP: 5.0000 mL | ORAL_SOLUTION | ORAL | Status: DC | PRN

## 2014-01-14 MED ORDER — AMOXICILLIN-POT CLAVULANATE 875-125 MG PO TABS: 1.0000 | ORAL_TABLET | Freq: Two times a day (BID) | ORAL | Status: DC

## 2014-01-14 NOTE — Patient Instructions (Signed)
Use over-the-counter  "cold" medicines  such as Please, make an appointment if you are not better or if you're worse.

## 2014-01-14 NOTE — Progress Notes (Signed)
Pre visit review using our clinic review tool, if applicable. No additional management support is needed unless otherwise documented below in the visit note. 

## 2014-01-14 NOTE — Assessment & Plan Note (Signed)
Augmentin x10d Prom-cod syr

## 2014-01-14 NOTE — Progress Notes (Signed)
Subjective:    URI  This is a new problem. The current episode started in the past 7 days. The problem has been gradually worsening. There has been no fever. Associated symptoms include congestion, coughing, a plugged ear sensation, rhinorrhea and sinus pain. Pertinent negatives include no chest pain, diarrhea, nausea or rash. The treatment provided no relief.      The patient presents for a follow-up of  chronic hypertension, chronic dyslipidemia, OA, CAD controlled with medicines. He finished XRT, Dr Miquel Dunn has d/c'd him. Seeing Dr Jeffie Pollock q 6 mo. Chol med was changed by [place]  He had lithotripsy last month  Wt Readings from Last 3 Encounters:  01/14/14 223 lb (101.152 kg)  10/06/13 223 lb (101.152 kg)  09/16/13 215 lb 2 oz (97.58 kg)   BP Readings from Last 3 Encounters:  01/14/14 104/62  10/06/13 102/72  09/16/13 134/92      Review of Systems  Constitutional: Negative for appetite change, fatigue and unexpected weight change.  HENT: Positive for congestion and rhinorrhea. Negative for nosebleeds and trouble swallowing.   Eyes: Negative for itching and visual disturbance.  Respiratory: Positive for cough.   Cardiovascular: Negative for chest pain, palpitations and leg swelling.  Gastrointestinal: Negative for nausea, diarrhea, blood in stool and abdominal distention.  Genitourinary: Negative for frequency and hematuria.  Musculoskeletal: Negative for back pain, gait problem and joint swelling.  Skin: Negative for rash.  Neurological: Negative for dizziness, tremors, speech difficulty and weakness.  Psychiatric/Behavioral: Negative for sleep disturbance, dysphoric mood and agitation. The patient is not nervous/anxious.        Objective:   Physical Exam  Constitutional: He is oriented to person, place, and time. He appears well-developed.  obese  HENT:  Mouth/Throat: Oropharynx is clear and moist.  Eyes: Conjunctivae are normal. Pupils are equal, round, and reactive to  light.  Neck: Normal range of motion. No JVD present. No thyromegaly present.  Cardiovascular: Normal rate, regular rhythm, normal heart sounds and intact distal pulses.  Exam reveals no gallop and no friction rub.   No murmur heard. Pulmonary/Chest: Effort normal and breath sounds normal. No respiratory distress. He has no wheezes. He has no rales. He exhibits no tenderness.  Abdominal: Soft. Bowel sounds are normal. He exhibits no distension and no mass. There is no tenderness. There is no rebound and no guarding.  Musculoskeletal: Normal range of motion. He exhibits no edema and no tenderness.  Lymphadenopathy:    He has no cervical adenopathy.  Neurological: He is alert and oriented to person, place, and time. He has normal reflexes. No cranial nerve deficit. He exhibits normal muscle tone. Coordination normal.  Skin: Skin is warm and dry. No rash noted.  Psychiatric: He has a normal mood and affect. His behavior is normal. Judgment and thought content normal.  hoarse  eryth throat   Lab Results  Component Value Date   WBC 6.7 02/10/2013   HGB 15.1 02/10/2013   HCT 44.9 02/10/2013   PLT 156 02/10/2013   GLUCOSE 101* 10/06/2013   CHOL 157 04/14/2013   TRIG 130.0 04/14/2013   HDL 57.50 04/14/2013   LDLCALC 74 04/14/2013   ALT 21 04/14/2013   AST 45* 04/14/2013   NA 139 10/06/2013   K 4.3 10/06/2013   CL 104 10/06/2013   CREATININE 1.4 10/06/2013   BUN 18 10/06/2013   CO2 30 10/06/2013   TSH 1.82 08/09/2011   INR 1.12 01/19/2010   HGBA1C 5.8 08/09/2011  Assessment & Plan:

## 2014-01-17 ENCOUNTER — Ambulatory Visit: Payer: BC Managed Care – PPO | Admitting: Internal Medicine

## 2014-03-08 NOTE — Telephone Encounter (Signed)
Forgot to close encounter

## 2014-04-05 ENCOUNTER — Ambulatory Visit (INDEPENDENT_AMBULATORY_CARE_PROVIDER_SITE_OTHER): Payer: BC Managed Care – PPO | Admitting: Internal Medicine

## 2014-04-05 ENCOUNTER — Encounter: Payer: Self-pay | Admitting: Internal Medicine

## 2014-04-05 VITALS — BP 110/72 | HR 88 | Temp 97.9°F | Ht 71.0 in | Wt 215.2 lb

## 2014-04-05 DIAGNOSIS — M109 Gout, unspecified: Secondary | ICD-10-CM

## 2014-04-05 DIAGNOSIS — N189 Chronic kidney disease, unspecified: Secondary | ICD-10-CM

## 2014-04-05 DIAGNOSIS — J209 Acute bronchitis, unspecified: Secondary | ICD-10-CM

## 2014-04-05 DIAGNOSIS — I2510 Atherosclerotic heart disease of native coronary artery without angina pectoris: Secondary | ICD-10-CM

## 2014-04-05 DIAGNOSIS — I10 Essential (primary) hypertension: Secondary | ICD-10-CM

## 2014-04-05 DIAGNOSIS — M519 Unspecified thoracic, thoracolumbar and lumbosacral intervertebral disc disorder: Secondary | ICD-10-CM

## 2014-04-05 MED ORDER — AZITHROMYCIN 250 MG PO TABS: ORAL_TABLET | ORAL | Status: DC

## 2014-04-05 NOTE — Progress Notes (Signed)
Pre visit review using our clinic review tool, if applicable. No additional management support is needed unless otherwise documented below in the visit note. 

## 2014-04-05 NOTE — Assessment & Plan Note (Signed)
Continue with current prescription therapy as reflected on the Med list.  

## 2014-04-05 NOTE — Assessment & Plan Note (Signed)
Doing well 

## 2014-04-05 NOTE — Assessment & Plan Note (Signed)
Zpac if worse 

## 2014-04-05 NOTE — Assessment & Plan Note (Signed)
Labs

## 2014-04-05 NOTE — Progress Notes (Addendum)
Subjective:    URI  This is a new problem. The current episode started yesterday. The problem has been waxing and waning. There has been no fever. Associated symptoms include congestion, coughing, rhinorrhea and sinus pain. Pertinent negatives include no chest pain, diarrhea, nausea, plugged ear sensation or rash. He has tried antihistamine for the symptoms. The treatment provided mild relief.      The patient presents for a follow-up of  chronic hypertension, chronic dyslipidemia, OA, CAD controlled with medicines. He finished XRT, Dr Miquel Dunn has d/c'd him. Seeing Dr Jeffie Pollock q 6 mo. Chol med was changed by [place]  He had lithotripsy a few months ago  Wt Readings from Last 3 Encounters:  04/05/14 215 lb 4 oz (97.637 kg)  01/14/14 223 lb (101.152 kg)  10/06/13 223 lb (101.152 kg)   BP Readings from Last 3 Encounters:  04/05/14 110/72  01/14/14 104/62  10/06/13 102/72      Review of Systems  Constitutional: Negative for appetite change, fatigue and unexpected weight change.  HENT: Positive for congestion and rhinorrhea. Negative for nosebleeds and trouble swallowing.   Eyes: Negative for itching and visual disturbance.  Respiratory: Positive for cough.   Cardiovascular: Negative for chest pain, palpitations and leg swelling.  Gastrointestinal: Negative for nausea, diarrhea, blood in stool and abdominal distention.  Genitourinary: Negative for frequency and hematuria.  Musculoskeletal: Negative for back pain, gait problem and joint swelling.  Skin: Negative for rash.  Neurological: Negative for dizziness, tremors, speech difficulty and weakness.  Psychiatric/Behavioral: Negative for sleep disturbance, dysphoric mood and agitation. The patient is not nervous/anxious.        Objective:   Physical Exam  Constitutional: He is oriented to person, place, and time. He appears well-developed.  obese  HENT:  Mouth/Throat: Oropharynx is clear and moist.  Eyes: Conjunctivae are normal.  Pupils are equal, round, and reactive to light.  Neck: Normal range of motion. No JVD present. No thyromegaly present.  Cardiovascular: Normal rate, regular rhythm, normal heart sounds and intact distal pulses.  Exam reveals no gallop and no friction rub.   No murmur heard. Pulmonary/Chest: Effort normal and breath sounds normal. No respiratory distress. He has no wheezes. He has no rales. He exhibits no tenderness.  Abdominal: Soft. Bowel sounds are normal. He exhibits no distension and no mass. There is no tenderness. There is no rebound and no guarding.  Musculoskeletal: Normal range of motion. He exhibits no edema and no tenderness.  Lymphadenopathy:    He has no cervical adenopathy.  Neurological: He is alert and oriented to person, place, and time. He has normal reflexes. No cranial nerve deficit. He exhibits normal muscle tone. Coordination normal.  Skin: Skin is warm and dry. No rash noted.  Psychiatric: He has a normal mood and affect. His behavior is normal. Judgment and thought content normal.  hoarse  eryth throat   Lab Results  Component Value Date   WBC 6.7 02/10/2013   HGB 15.1 02/10/2013   HCT 44.9 02/10/2013   PLT 156 02/10/2013   GLUCOSE 101* 10/06/2013   CHOL 157 04/14/2013   TRIG 130.0 04/14/2013   HDL 57.50 04/14/2013   LDLCALC 74 04/14/2013   ALT 21 04/14/2013   AST 45* 04/14/2013   NA 139 10/06/2013   K 4.3 10/06/2013   CL 104 10/06/2013   CREATININE 1.4 10/06/2013   BUN 18 10/06/2013   CO2 30 10/06/2013   TSH 1.82 08/09/2011   INR 1.12 01/19/2010   HGBA1C 5.8  08/09/2011         Assessment & Plan:

## 2014-04-19 ENCOUNTER — Encounter: Payer: Self-pay | Admitting: Cardiovascular Disease

## 2014-04-19 ENCOUNTER — Ambulatory Visit (INDEPENDENT_AMBULATORY_CARE_PROVIDER_SITE_OTHER): Payer: BC Managed Care – PPO | Admitting: Cardiovascular Disease

## 2014-04-19 VITALS — BP 122/82 | HR 75 | Ht 70.5 in | Wt 218.0 lb

## 2014-04-19 DIAGNOSIS — I2510 Atherosclerotic heart disease of native coronary artery without angina pectoris: Secondary | ICD-10-CM

## 2014-04-19 DIAGNOSIS — I10 Essential (primary) hypertension: Secondary | ICD-10-CM

## 2014-04-19 DIAGNOSIS — E785 Hyperlipidemia, unspecified: Secondary | ICD-10-CM

## 2014-04-19 NOTE — Patient Instructions (Signed)
Your physician wants you to follow-up in:  12 months. You will receive a reminder letter in the mail two months in advance. If you don't receive a letter, please call our office to schedule the follow-up appointment.  Your physician has requested that you have an exercise tolerance test. Can be done with NP or PA or at [facility] office.  For further information please visit [email or web address]. Please also follow instruction sheet, as given.

## 2014-04-19 NOTE — Progress Notes (Signed)
History of Present Illness: 68 yo male with history of CAD, s/p posterior wall MI in 2004 treated with overlapping stents in the Circumflex (one Zeta stent and one Taxus DES), s/p cutting balloon angioplasty of the CFX secondary to ISR in 12/2009, HLD, HTN, gout, chronic kidney disease here today for follow up. He has been followed in the past by Dr. Verl Blalock. Last stress perfusion study 03/2011 showed prior inferior and inferolateral defect consistent with prior MI but no ischemia. Seen by Richardson Dopp, PA-C February 2014 for pre-operative evaluation before penile implant and by Dr. Verl Blalock May 2014.   He is here today for cardiac follow up. He continues to have occasional mild chest pains at rest. No exertional chest pains. He exercises three days per week.   Primary Care Physician: Plotnikov  Last Lipid Profile:Lipid Panel     Component Value Date/Time   CHOL 157 04/14/2013 0947   TRIG 130.0 04/14/2013 0947   HDL 57.50 04/14/2013 0947   CHOLHDL 3 04/14/2013 0947   VLDL 26.0 04/14/2013 0947   LDLCALC 74 04/14/2013 0947    Past Medical History  Diagnosis Date  . HYPERLIPIDEMIA 06/14/2008  . GOUT 06/14/2008  . HYPERTENSION 02/02/2010  . CORONARY ARTERY DISEASE 01/12/2008    a. s/p post wall MI in 2004 => overlapping stents in the CFX (one Zeta stent and one Taxus DES);  b. [place] => LHC 2/11: Normal LM, normal LAD, normal Dx, CFX 80% ISR, PDA 30-40%, EF 55%. PCI: Cutting balloon angioplasty to the CFX ISR;  c.  Lex MV 5/12: inf and IL defect c/w prior MI, no ischemia, EF 54%  . Hx of echocardiogram 06/14/2008    a. echo 12/2009: mild LVH, EF 60-65%  . Cellulitis and abscess of other specified site 06/14/2008  . OSTEOARTHRITIS 01/12/2008    injection to spine on 04/01/11  . PROSTATE CANCER, HX OF 01/12/2008  . Former smoker 01/10/2010  . Lumbar disc disease 03/14/2011    hx of low back pain, paresthesia, leg pain  . S/P lumbar discectomy 03/14/2011  . S/P radiation therapy 10/02/11 - 11/25/11    Central  Lower Pelvis: 6840 cGy/38 Fractions  . Diverticulosis   . Myocardial infarction 2004    3 stents  . Carpal tunnel syndrome 06/07/2008    slight  . Cancer     prostate 2012  . PONV (postoperative nausea and vomiting)   . Plantar fasciitis     Past Surgical History  Procedure Laterality Date  . Lumbar laminectomy    . Prostatectomy  2001  . Right ureteroscopic stone extraction with holmium lasertripsy and right ureteral stent insertion  9.1.2005  . Cystoscopy  07/19/2004    insertion of right double-J stent  . Repair of left inguinal hernia with mesh  52/06/4131    ([facility])  . Epidurography  04/01/11    Steroid Injection:Lumbar spondylosis/Advanced Facet Arthropathy:  . Back surgery  1994    discectomy/fusion  . Bone spur removed  1997    neck  . Penile prosthesis placement  2003  . Cardiac catheterization      3 stents  . Coronary angioplasty  2011  . Hernia repair    . Penile prosthesis implant N/A 02/16/2013    Procedure: REPLACEMENT OF AMS PENILE PROTHESIS INFLATABLE;  Surgeon: Malka So, MD;  Location: [facility];  Service: Urology;  Laterality: N/A;    Current Outpatient Prescriptions  Medication Sig Dispense Refill  . allopurinol (ZYLOPRIM) 300 MG tablet  Take 300 mg by mouth daily.      [name] Kitchen aspirin EC 81 MG tablet Take 81 mg by mouth daily.      [name] Kitchen atorvastatin (LIPITOR) 80 MG tablet Take 40 mg by mouth every evening. Takes 1/2 tablet      . Cholecalciferol (VITAMIN D3) 1000 UNITS CAPS Take 1 capsule by mouth daily.       [name] Kitchen gabapentin (NEURONTIN) 100 MG capsule Take 300 mg by mouth at bedtime.      [name] Kitchen lisinopril (PRINIVIL,ZESTRIL) 40 MG tablet Take 20 mg by mouth every evening. Takes 1/2 tablet      . meloxicam (MOBIC) 15 MG tablet Take 15 mg by mouth daily.      . metoprolol tartrate (LOPRESSOR) 25 MG tablet Take 12.5 mg by mouth every evening. Takes 1/2 tablet      . Multiple Vitamin (MULTIVITAMIN WITH MINERALS) TABS Take 1 tablet by mouth daily.      .  nitroGLYCERIN (NITROSTAT) 0.4 MG SL tablet Place 1 tablet (0.4 mg total) under the tongue every 5 (five) minutes as needed for chest pain.  20 tablet  5  . Omega-3 Fatty Acids (FISH OIL) 1000 MG CAPS Take 1 capsule by mouth daily.       . traMADol (ULTRAM) 50 MG tablet Take 50 mg by mouth every 6 (six) hours as needed for pain.      . vitamin B-12 (CYANOCOBALAMIN) 500 MCG tablet Take 500 mcg by mouth daily.       No current facility-administered medications for this visit.    No Known Allergies  History   Social History  . Marital Status: Married    Spouse Name: N/A    Number of Children: 97  . 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 Smoking status: Former Smoker -- 1.50 packs/day for 40 years    Types: Cigarettes    Quit date: 11/28/2004  . Smokeless tobacco: Never Used     Comment: quit appox 7 yrs ago  . Alcohol Use: Yes     Comment: 2 drinks a day  . Drug Use: No  . Sexual Activity: Yes   Other Topics Concern  . Not on file   Social History Narrative  . No narrative on file    Family History  Problem Relation Age of Onset  . Hypertension Other   . Heart attack Father     Review of Systems:  As stated in the HPI and otherwise negative.   BP 122/82  Pulse 75  Ht 5' 10.5" (1.791 m)  Wt 218 lb (98.884 kg)  BMI 30.83 kg/m2  Physical Examination: General: Well developed, well nourished, NAD HEENT: OP clear, mucus membranes moist SKIN: warm, dry. No rashes. Neuro: No focal deficits Musculoskeletal: Muscle strength 5/5 all ext Psychiatric: Mood and affect normal Neck: No JVD, no carotid bruits, no thyromegaly, no lymphadenopathy. Lungs:Clear bilaterally, no wheezes, rhonci, crackles Cardiovascular: Regular rate and rhythm. No murmurs, gallops or rubs. Abdomen:Soft. Bowel sounds present. Non-tender.  Extremities: No lower extremity edema. Pulses are 2 + in the bilateral DP/PT.  EKG:  NSR, rate 75 bpm.   Assessment and Plan:   1. CAD: Stable. He is on good medical therapy. He is on statin, ASA and beta blocker. No recent stress testing. Will arrange exercise treadmill stress test.   2. HTN: BP well controlled. No changes.   3. HLD: He is on a  statin. Lipids are well controlled.

## 2014-04-27 ENCOUNTER — Encounter (HOSPITAL_COMMUNITY): Payer: BC Managed Care – PPO

## 2014-05-04 ENCOUNTER — Telehealth (HOSPITAL_COMMUNITY): Payer: Self-pay

## 2014-05-06 ENCOUNTER — Ambulatory Visit (HOSPITAL_COMMUNITY)
Admission: RE | Admit: 2014-05-06 | Discharge: 2014-05-06 | Disposition: A | Discharge status: Home / Self Care | Payer: BC Managed Care – PPO | Source: Ambulatory Visit | Attending: Cardiology | Admitting: Cardiology

## 2014-05-06 DIAGNOSIS — I2510 Atherosclerotic heart disease of native coronary artery without angina pectoris: Secondary | ICD-10-CM | POA: Insufficient documentation

## 2014-05-10 ENCOUNTER — Encounter (HOSPITAL_COMMUNITY): Payer: BC Managed Care – PPO

## 2014-06-09 NOTE — Telephone Encounter (Signed)
Encounter complete. 

## 2014-08-10 ENCOUNTER — Encounter: Payer: Self-pay | Admitting: Internal Medicine

## 2014-08-10 ENCOUNTER — Ambulatory Visit (INDEPENDENT_AMBULATORY_CARE_PROVIDER_SITE_OTHER): Payer: BC Managed Care – PPO | Admitting: Internal Medicine

## 2014-08-10 VITALS — BP 110/78 | HR 87 | Temp 98.0°F | Wt 219.0 lb

## 2014-08-10 DIAGNOSIS — Z23 Encounter for immunization: Secondary | ICD-10-CM

## 2014-08-10 DIAGNOSIS — Z0000 Encounter for general adult medical examination without abnormal findings: Secondary | ICD-10-CM

## 2014-08-10 MED ORDER — TRAMADOL HCL 50 MG PO TABS: 50.0000 mg | ORAL_TABLET | Freq: Four times a day (QID) | ORAL | Status: DC | PRN

## 2014-08-10 NOTE — Progress Notes (Signed)
Pre visit review using our clinic review tool, if applicable. No additional management support is needed unless otherwise documented below in the visit note. 

## 2014-08-10 NOTE — Progress Notes (Signed)
   Subjective:    HPI   F/u [facility] pain [facility]. Using Tramadol prn  Well exam/labs at [facility] q 1 year  The patient presents for a follow-up of  chronic hypertension, chronic dyslipidemia, OA, CAD controlled with medicines. He finished XRT, Dr Miquel Dunn has d/c'd him. Seeing Dr Jeffie Pollock q 6 mo. Chol med was changed by [place]  He had lithotripsy a few months ago  Wt Readings from Last 3 Encounters:  08/10/14 219 lb (99.338 kg)  04/19/14 218 lb (98.884 kg)  04/05/14 215 lb 4 oz (97.637 kg)   BP Readings from Last 3 Encounters:  08/10/14 110/78  04/19/14 122/82  04/05/14 110/72      Review of Systems  Constitutional: Negative for appetite change, fatigue and unexpected weight change.  HENT: Negative for nosebleeds and trouble swallowing.   Eyes: Negative for itching and visual disturbance.  Cardiovascular: Negative for palpitations and leg swelling.  Gastrointestinal: Negative for blood in stool and abdominal distention.  Genitourinary: Negative for frequency and hematuria.  Musculoskeletal: Negative for back pain, gait problem and joint swelling.  Neurological: Negative for dizziness, tremors, speech difficulty and weakness.  Psychiatric/Behavioral: Negative for sleep disturbance, dysphoric mood and agitation. The patient is not nervous/anxious.        Objective:   Physical Exam  Constitutional: He is oriented to person, place, and time. He appears well-developed.  obese  HENT:  Mouth/Throat: Oropharynx is clear and moist.  Eyes: Conjunctivae are normal. Pupils are equal, round, and reactive to light.  Neck: Normal range of motion. No JVD present. No thyromegaly present.  Cardiovascular: Normal rate, regular rhythm, normal heart sounds and intact distal pulses.  Exam reveals no gallop and no friction rub.   No murmur heard. Pulmonary/Chest: Effort normal and breath sounds normal. No respiratory distress. He has no wheezes. He has no rales. He exhibits no tenderness.   Abdominal: Soft. Bowel sounds are normal. He exhibits no distension and no mass. There is no tenderness. There is no rebound and no guarding.  Musculoskeletal: Normal range of motion. He exhibits no edema and no tenderness.  Lymphadenopathy:    He has no cervical adenopathy.  Neurological: He is alert and oriented to person, place, and time. He has normal reflexes. No cranial nerve deficit. He exhibits normal muscle tone. Coordination normal.  Skin: Skin is warm and dry. No rash noted.  Psychiatric: He has a normal mood and affect. His behavior is normal. Judgment and thought content normal.  Neck i tender w/ROM   Lab Results  Component Value Date   WBC 6.7 02/10/2013   HGB 15.1 02/10/2013   HCT 44.9 02/10/2013   PLT 156 02/10/2013   GLUCOSE 101* 10/06/2013   CHOL 157 04/14/2013   TRIG 130.0 04/14/2013   HDL 57.50 04/14/2013   LDLCALC 74 04/14/2013   ALT 21 04/14/2013   AST 45* 04/14/2013   NA 139 10/06/2013   K 4.3 10/06/2013   CL 104 10/06/2013   CREATININE 1.4 10/06/2013   BUN 18 10/06/2013   CO2 30 10/06/2013   TSH 1.82 08/09/2011   INR 1.12 01/19/2010   HGBA1C 5.8 08/09/2011         Assessment & Plan:

## 2014-08-31 ENCOUNTER — Encounter: Payer: Self-pay | Admitting: Internal Medicine

## 2014-09-09 ENCOUNTER — Other Ambulatory Visit: Payer: Self-pay

## 2014-11-08 ENCOUNTER — Other Ambulatory Visit: Payer: Self-pay | Admitting: Urology

## 2014-11-22 ENCOUNTER — Encounter (HOSPITAL_BASED_OUTPATIENT_CLINIC_OR_DEPARTMENT_OTHER): Payer: Self-pay | Admitting: *Deleted

## 2014-11-23 ENCOUNTER — Encounter (HOSPITAL_BASED_OUTPATIENT_CLINIC_OR_DEPARTMENT_OTHER): Payer: Self-pay | Admitting: *Deleted

## 2014-11-23 NOTE — Progress Notes (Signed)
   11/23/14 1123  OBSTRUCTIVE SLEEP APNEA  Have you ever been diagnosed with sleep apnea through a sleep study? No  Do you snore loudly (loud enough to be heard through closed doors)?  1  Do you often feel tired, fatigued, or sleepy during the daytime? 0  Has anyone observed you stop breathing during your sleep? 0  Do you have, or are you being treated for high blood pressure? 1  BMI more than 35 kg/m2? 0  Age over 68 years old? 1  Neck circumference greater than 40 cm/16 inches? 1  Gender: 1  Obstructive Sleep Apnea Score 5  Score 4 or greater  Results sent to PCP

## 2014-11-23 NOTE — Progress Notes (Signed)
NPO AFTER MN. ARRIVE AT 0600. NEEDS ISTAT.  CURRENT EKG IN CHART AND EPIC. MAY TAKE TRAMADOL IF NEED AM DOS W/ SIPS OF WATER.

## 2014-11-25 DIAGNOSIS — Z8551 Personal history of malignant neoplasm of bladder: Secondary | ICD-10-CM

## 2014-11-25 HISTORY — DX: Personal history of malignant neoplasm of bladder: Z85.51

## 2014-11-30 NOTE — H&P (Signed)
Active Problems Problems  1. Bilateral kidney stones (N20.0) 2. History of Erectile dysfunction due to arterial insufficiency (N52.01) 3. Gross hematuria (R31.0) 4. 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 Kidney stone on left side (N20.0) 6. Lumbago (M54.5) 7. Muscle weakness (M62.81) 8. Muscular incoordination (R27.8) 9. Nocturia (R35.1) 10. Prostate cancer (C61) 11. Urge and stress incontinence (N39.46) 12. Urinary frequency (R35.0)  History of Present Illness [name] returns today in f/u for his hematuria w/u. He has had additional spotting and some bladder discomfort. The CT showed non-obstructing bilateral renal stones.   Past Medical History Problems  1. History of Acute Myocardial Infarction 2. History of Calculus of ureter (N20.1) 3. History of Cancer 4. History of Erectile dysfunction due to arterial insufficiency (N52.01) 5. History of Gout (M10.9) 6. History of Gross hematuria (R31.0) 7. History of cardiac disorder (Z86.79) 8. History of hypercholesterolemia (Z86.39) 9. History of hypertension (Z86.79) 10. Personal history of prostate cancer (Z85.46)  Surgical History Problems  1. History of Back Surgery 2. History of Back Surgery 3. History of Cystoscopy With Ureteroscopy Right 4. History of Lithotripsy 5. History of Neck Surgery 6. History of Prostatectomy Retropubic Radical With Lymph Node Biopsy(S) 7. History of Surg Penis Insertion Of Penile Prosthesis 8. History of Surg Penis Replacement Of Inflatable Penile Prosthesis  Current Meds 1. Allopurinol 300 MG Oral Tablet;  Therapy: [id number] to Recorded 2. Aspirin 325 MG Oral Tablet;  Therapy: (Recorded:23Sep2014) to Recorded 3. Atorvastatin Calcium 80 MG Oral Tablet;  Therapy: (Recorded:23Sep2014) to Recorded 4. Centrum Silver TABS;  Therapy: (Recorded:09Apr2012) to Recorded 5. Cyclobenzaprine HCl - 10 MG Oral Tablet;  Therapy: 07Oct2015 to Recorded 6. Fish Oil CAPS;  Therapy: (Recorded:09Apr2012) to  Recorded 7. Lisinopril 40 MG Oral Tablet;  Therapy: (Recorded:23Sep2014) to Recorded 8. Meloxicam 15 MG Oral Tablet;  Therapy: (Recorded:10Oct2012) to Recorded 9. Metoprolol Tartrate 25 MG Oral Tablet;  Therapy: (Recorded:23Sep2014) to Recorded 10. Neurontin 100 MG Oral Capsule;   Therapy: (Recorded:23Sep2014) to Recorded 11. Nitrostat 0.4 MG Sublingual Tablet Sublingual;   Therapy: [id number] to Recorded 12. Omega-3 CAPS;   Therapy: (Recorded:23Sep2014) to Recorded 13. Pravastatin Sodium 80 MG Oral Tablet;   Therapy: 26Oct2011 to Recorded 14. Triamcinolone Acetonide 0.5 % External Cream;   Therapy: (Recorded:23Sep2014) to Recorded 15. Vitamin B-12 TABS;   Therapy: (Recorded:23Sep2014) to Recorded 16. Vitamin D TABS;   Therapy: (Recorded:09Apr2012) to Recorded  Allergies Medication  1. No Known Drug Allergies  Family History Problems  1. Family history of Death In The Family Father   died age 19-heart disease 2. Family history of Death In The Family Mother   died 45-cirrosis of liver 3. Family history of Heart Disease : Father  Social History Problems  1. Activities of daily living (ADL's), independent 2. Alcohol Use 3. Caffeine Use 4. Exercise habits   He indicates he and his wife just joined the gym and will be starting a workout in the very     near future.  He is very active at work. 5. Former smoker [phone]) 6. Marital History - Currently Married 7. Occupation:   Physiological scientist 8. Tobacco Use   quit 7+ yrs ago  Review of Systems Genitourinary, constitutional, skin, eye, otolaryngeal, hematologic/lymphatic, cardiovascular, pulmonary, endocrine, musculoskeletal, gastrointestinal, neurological and psychiatric system(s) were reviewed and pertinent findings if present are noted and are otherwise negative.  Genitourinary: hematuria (recent spotting).    Vitals Vital Signs [Data Includes: Last 1 Day]  Recorded: [id number] 11:32AM  Blood Pressure: 115 /  79 Temperature: 97 F Heart Rate: 81  Physical Exam Constitutional: Well nourished and well developed . No acute distress.  Pulmonary: No respiratory distress and normal respiratory rhythm and effort.  Cardiovascular: Heart rate and rhythm are normal . No peripheral edema.    Results/Data Urine [Data Includes: Last 1 Day]   [id number]  COLOR YELLOW   APPEARANCE CLEAR   SPECIFIC GRAVITY 1.020   pH 5.0   GLUCOSE NEG mg/dL  BILIRUBIN NEG   KETONE NEG mg/dL  BLOOD NEG   PROTEIN NEG mg/dL  UROBILINOGEN 0.2 mg/dL  NITRITE NEG   LEUKOCYTE ESTERASE NEG   Selected Results  AU CT-HEMATURIA PROTOCOL 12Oct2015 12:00AM Irine Seal   Test Name Result Flag Reference  AU CT-HEMATURIA PROTOCOL (Report)    ** RADIOLOGY REPORT BY [facility] **   CLINICAL DATA: Gross and microscopic hematuria. Gross hematuria 2 weeks ago, lasting for 1 day. Prostatectomy in 2001 for prostate carcinoma. Radiation therapy in 2013. History of renal stones. Lithotripsy. Initial encounter.  EXAM: CT ABDOMEN AND PELVIS WITHOUT AND WITH CONTRAST  TECHNIQUE: Multidetector CT imaging of the abdomen and pelvis was performed following the standard protocol before and following the bolus administration of intravenous contrast.  CONTRAST: 125 cc Isovue-300  COMPARISON: Plain films of 08/31/2014. CT of 08/07/2010.  FINDINGS: Lower chest: Clear lung bases. Mild cardiomegaly with multivessel coronary artery atherosclerosis. No pericardial or pleural effusion.  Hepatobiliary: Mild hepatic steatosis, without focal liver lesion. Normal gallbladder, without biliary ductal dilatation.  Pancreas: Normal, without mass or pancreatic ductal dilatation.  Spleen: Normal  Adrenals/Urinary Tract: Minimal adrenal nodularity bilaterally. Slightly greater on the right. Similar.  Bilateral renal collecting system calculi. The largest stone or stone conglomerate is in the lower pole left renal collecting  system and measures 1.5 cm. No hydronephrosis. No hydroureter or ureteric calculi. Mild right renal cortical thinning. Bilateral subcentimeter renal cysts. Moderate renal collecting system opacification on delayed images. Portions of the distal ureters are incompletely opacified on delayed images. No filling defects in the opacified portions. No bladder filling defect or enhancing mass. No bladder calculi.  Stomach/Bowel: Underdistended proximal stomach. Scattered colonic diverticula. Normal small bowel without abdominal ascites.  Vascular/Lymphatic: Advanced aortic and branch vessel atherosclerosis. No retroperitoneal or retrocrural adenopathy. No pelvic adenopathy.  Reproductive: Status post prostatectomy, without locally recurrent disease.  Other: Fat containing right inguinal hernia. Penile pump. No significant free fluid.  Musculoskeletal: Degenerative disc disease at the lumbosacral junction. Advanced spondylosis.  IMPRESSION: 1. Bilateral nephrolithiasis. No other explanation for hematuria. 2. Status post prostatectomy, without locally recurrent or metastatic disease. 3. Hepatic steatosis. 4. Atherosclerosis, including within the coronary arteries.   Electronically Signed  By: Abigail Miyamoto M.D.  On: 09/05/2014 10:55   Procedure  Procedure: Cystoscopy   Indication: Hematuria.  Informed Consent: Risks, benefits, and potential adverse events were discussed and informed consent was obtained from the patient.  Prep: The patient was prepped with betadine.  Anesthesia:. Local anesthesia was administered intraurethrally with 2% lidocaine jelly.  Antibiotic prophylaxis: Ciprofloxacin.  Procedure Note:  Urethral meatus:. No abnormalities.  Anterior urethra: No abnormalities.  Prostatic urethra:. Absent. there are some radiation changes at the bladder neck.  Bladder: Visulization was clear. The ureteral orifices were in the normal anatomic position bilaterally and had  clear efflux of urine. A systematic survey of the bladder demonstrated no bladder tumors or stones. Examination of the bladder demonstrated mild trabeculation erythematous mucosa (1 x 1.5cm posterior to the left UA there is erythema that is raised and is most consistent with radiation cystitis  but a neoplasm is not excluded. He has other small telangectasias. ). The patient tolerated the procedure well.  Complications: None.    Assessment Assessed  1. Gross hematuria (R31.0) 2. 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  He has non-obstructing stones on CT.   Plan Gross hematuria  1. Follow-up Schedule Surgery Office  Follow-up  Status: Hold For - Appointment   Requested for: [id number] Health Maintenance  2. UA With REFLEX; [Do Not Release]; Status:Resulted - Requires Verification;   Done:  [id number] 11:19AM  I will get him set up for the biopsy and reviewed the risks of bleeding, infection, bladder injury, strictures, thrombotic events and anesthetic complications.   Discussion/Summary CC: Dr. Walker Kehr.

## 2014-12-01 ENCOUNTER — Ambulatory Visit (HOSPITAL_BASED_OUTPATIENT_CLINIC_OR_DEPARTMENT_OTHER): Payer: BLUE CROSS/BLUE SHIELD | Admitting: Anesthesiology

## 2014-12-01 ENCOUNTER — Encounter (HOSPITAL_BASED_OUTPATIENT_CLINIC_OR_DEPARTMENT_OTHER)
Admission: RE | Disposition: A | Discharge status: Home / Self Care | Payer: Self-pay | Source: Ambulatory Visit | Attending: Urology

## 2014-12-01 ENCOUNTER — Encounter (HOSPITAL_BASED_OUTPATIENT_CLINIC_OR_DEPARTMENT_OTHER): Payer: Self-pay

## 2014-12-01 ENCOUNTER — Ambulatory Visit (HOSPITAL_BASED_OUTPATIENT_CLINIC_OR_DEPARTMENT_OTHER)
Admission: RE | Admit: 2014-12-01 | Discharge: 2014-12-01 | Disposition: A | Discharge status: Home / Self Care | Payer: BLUE CROSS/BLUE SHIELD | Source: Ambulatory Visit | Attending: Urology | Admitting: Urology

## 2014-12-01 DIAGNOSIS — N3041 Irradiation cystitis with hematuria: Secondary | ICD-10-CM | POA: Diagnosis not present

## 2014-12-01 DIAGNOSIS — Z87442 Personal history of urinary calculi: Secondary | ICD-10-CM | POA: Diagnosis not present

## 2014-12-01 DIAGNOSIS — C61 Malignant neoplasm of prostate: Secondary | ICD-10-CM | POA: Insufficient documentation

## 2014-12-01 DIAGNOSIS — Z859 Personal history of malignant neoplasm, unspecified: Secondary | ICD-10-CM | POA: Diagnosis not present

## 2014-12-01 DIAGNOSIS — N329 Bladder disorder, unspecified: Secondary | ICD-10-CM | POA: Diagnosis not present

## 2014-12-01 DIAGNOSIS — I2510 Atherosclerotic heart disease of native coronary artery without angina pectoris: Secondary | ICD-10-CM | POA: Diagnosis not present

## 2014-12-01 DIAGNOSIS — M1990 Unspecified osteoarthritis, unspecified site: Secondary | ICD-10-CM | POA: Insufficient documentation

## 2014-12-01 DIAGNOSIS — Z79899 Other long term (current) drug therapy: Secondary | ICD-10-CM | POA: Diagnosis not present

## 2014-12-01 DIAGNOSIS — M109 Gout, unspecified: Secondary | ICD-10-CM | POA: Diagnosis not present

## 2014-12-01 DIAGNOSIS — N189 Chronic kidney disease, unspecified: Secondary | ICD-10-CM | POA: Diagnosis not present

## 2014-12-01 DIAGNOSIS — Z87891 Personal history of nicotine dependence: Secondary | ICD-10-CM | POA: Diagnosis not present

## 2014-12-01 DIAGNOSIS — I252 Old myocardial infarction: Secondary | ICD-10-CM | POA: Insufficient documentation

## 2014-12-01 DIAGNOSIS — Z7982 Long term (current) use of aspirin: Secondary | ICD-10-CM | POA: Insufficient documentation

## 2014-12-01 DIAGNOSIS — E780 Pure hypercholesterolemia: Secondary | ICD-10-CM | POA: Insufficient documentation

## 2014-12-01 DIAGNOSIS — I129 Hypertensive chronic kidney disease with stage 1 through stage 4 chronic kidney disease, or unspecified chronic kidney disease: Secondary | ICD-10-CM | POA: Insufficient documentation

## 2014-12-01 HISTORY — PX: CYSTOSCOPY WITH BIOPSY: SHX5122

## 2014-12-01 HISTORY — DX: Presence of coronary angioplasty implant and graft: Z95.5

## 2014-12-01 HISTORY — DX: Unspecified osteoarthritis, unspecified site: M19.90

## 2014-12-01 HISTORY — DX: Contact with and (suspected) exposure to other hazardous, chiefly nonmedicinal, chemicals: Z77.098

## 2014-12-01 HISTORY — DX: Malignant neoplasm of prostate: C61

## 2014-12-01 HISTORY — DX: Male erectile dysfunction, unspecified: N52.9

## 2014-12-01 HISTORY — DX: Presence of spectacles and contact lenses: Z97.3

## 2014-12-01 HISTORY — DX: Old myocardial infarction: I25.2

## 2014-12-01 HISTORY — DX: Stress incontinence (female) (male): N39.3

## 2014-12-01 HISTORY — DX: Personal history of urinary calculi: Z87.442

## 2014-12-01 HISTORY — DX: Diverticulosis of large intestine without perforation or abscess without bleeding: K57.30

## 2014-12-01 HISTORY — DX: Hyperlipidemia, unspecified: E78.5

## 2014-12-01 HISTORY — DX: Other intervertebral disc degeneration, lumbar region without mention of lumbar back pain or lower extremity pain: M51.369

## 2014-12-01 HISTORY — DX: Essential (primary) hypertension: I10

## 2014-12-01 HISTORY — DX: Other intervertebral disc degeneration, lumbar region: M51.36

## 2014-12-01 HISTORY — DX: Calculus of kidney: N20.0

## 2014-12-01 LAB — POCT I-STAT 4, (NA,K, GLUC, HGB,HCT)
Glucose, Bld: 116 mg/dL — ABNORMAL HIGH (ref 70–99)
HEMATOCRIT: 46 % (ref 39.0–52.0)
HEMOGLOBIN: 15.6 g/dL (ref 13.0–17.0)
Potassium: 4.1 mmol/L (ref 3.5–5.1)
Sodium: 138 mmol/L (ref 135–145)

## 2014-12-01 SURGERY — CYSTOSCOPY, WITH BIOPSY
Anesthesia: General | Site: Bladder

## 2014-12-01 MED ORDER — FENTANYL CITRATE 0.05 MG/ML IJ SOLN
25.0000 ug | INTRAMUSCULAR | Status: DC | PRN
  Filled 2014-12-01: qty 1

## 2014-12-01 MED ORDER — CIPROFLOXACIN IN D5W 400 MG/200ML IV SOLN
INTRAVENOUS | Status: AC
  Filled 2014-12-01: qty 200

## 2014-12-01 MED ORDER — FENTANYL CITRATE 0.05 MG/ML IJ SOLN
INTRAMUSCULAR | Status: AC
  Filled 2014-12-01: qty 4

## 2014-12-01 MED ORDER — DEXAMETHASONE SODIUM PHOSPHATE 4 MG/ML IJ SOLN
INTRAMUSCULAR | Status: DC | PRN
  Administered 2014-12-01: 10 mg via INTRAVENOUS

## 2014-12-01 MED ORDER — MIDAZOLAM HCL 2 MG/2ML IJ SOLN
INTRAMUSCULAR | Status: AC
  Filled 2014-12-01: qty 2

## 2014-12-01 MED ORDER — PHENYLEPHRINE HCL 10 MG/ML IJ SOLN
10.0000 mg | INTRAVENOUS | Status: DC | PRN
  Administered 2014-12-01: 25 ug/min via INTRAVENOUS

## 2014-12-01 MED ORDER — SODIUM CHLORIDE 0.9 % IJ SOLN
3.0000 mL | Freq: Two times a day (BID) | INTRAMUSCULAR | Status: DC
  Filled 2014-12-01: qty 3

## 2014-12-01 MED ORDER — PROMETHAZINE HCL 25 MG/ML IJ SOLN
6.2500 mg | INTRAMUSCULAR | Status: DC | PRN
Start: 2014-12-01 — End: 2014-12-01
  Filled 2014-12-01: qty 1

## 2014-12-01 MED ORDER — OXYCODONE HCL 5 MG PO TABS
5.0000 mg | ORAL_TABLET | ORAL | Status: DC | PRN
  Filled 2014-12-01: qty 2

## 2014-12-01 MED ORDER — ACETAMINOPHEN 325 MG PO TABS
650.0000 mg | ORAL_TABLET | ORAL | Status: DC | PRN
  Filled 2014-12-01: qty 2

## 2014-12-01 MED ORDER — MEPERIDINE HCL 25 MG/ML IJ SOLN
6.2500 mg | INTRAMUSCULAR | Status: DC | PRN
  Filled 2014-12-01: qty 1

## 2014-12-01 MED ORDER — SODIUM CHLORIDE 0.9 % IV SOLN
250.0000 mL | INTRAVENOUS | Status: DC | PRN
  Filled 2014-12-01: qty 250

## 2014-12-01 MED ORDER — SODIUM CHLORIDE 0.9 % IJ SOLN
3.0000 mL | INTRAMUSCULAR | Status: DC | PRN
  Filled 2014-12-01: qty 3

## 2014-12-01 MED ORDER — ACETAMINOPHEN 650 MG RE SUPP
650.0000 mg | RECTAL | Status: DC | PRN
  Filled 2014-12-01: qty 1

## 2014-12-01 MED ORDER — ONDANSETRON HCL 4 MG/2ML IJ SOLN
INTRAMUSCULAR | Status: DC | PRN
  Administered 2014-12-01: 4 mg via INTRAVENOUS

## 2014-12-01 MED ORDER — LACTATED RINGERS IV SOLN
INTRAVENOUS | Status: DC
  Administered 2014-12-01 (×2): via INTRAVENOUS
  Filled 2014-12-01: qty 1000

## 2014-12-01 MED ORDER — PROPOFOL 10 MG/ML IV BOLUS
INTRAVENOUS | Status: DC | PRN
  Administered 2014-12-01: 180 mg via INTRAVENOUS

## 2014-12-01 MED ORDER — FENTANYL CITRATE 0.05 MG/ML IJ SOLN
INTRAMUSCULAR | Status: DC | PRN
  Administered 2014-12-01: 50 ug via INTRAVENOUS

## 2014-12-01 MED ORDER — PHENYLEPHRINE HCL 10 MG/ML IJ SOLN
INTRAMUSCULAR | Status: DC | PRN
  Administered 2014-12-01: 80 ug via INTRAVENOUS

## 2014-12-01 MED ORDER — CIPROFLOXACIN IN D5W 400 MG/200ML IV SOLN
400.0000 mg | INTRAVENOUS | Status: AC
  Administered 2014-12-01: 400 mg via INTRAVENOUS
  Filled 2014-12-01: qty 200

## 2014-12-01 MED ORDER — STERILE WATER FOR IRRIGATION IR SOLN
Status: DC | PRN
  Administered 2014-12-01: 3000 mL

## 2014-12-01 MED ORDER — LIDOCAINE HCL (CARDIAC) 20 MG/ML IV SOLN
INTRAVENOUS | Status: DC | PRN
  Administered 2014-12-01: 80 mg via INTRAVENOUS

## 2014-12-01 MED ORDER — MIDAZOLAM HCL 5 MG/5ML IJ SOLN
INTRAMUSCULAR | Status: DC | PRN
  Administered 2014-12-01: 2 mg via INTRAVENOUS

## 2014-12-01 MED ORDER — LIDOCAINE HCL 2 % EX GEL
CUTANEOUS | Status: DC | PRN
  Administered 2014-12-01: 1 via URETHRAL

## 2014-12-01 SURGICAL SUPPLY — 24 items
BAG DRAIN URO-CYSTO SKYTR STRL (DRAIN) ×2 IMPLANT
BAG DRN UROCATH (DRAIN) ×1
CANISTER SUCT LVC 12 LTR MEDI- (MISCELLANEOUS) ×1 IMPLANT
CATH FOLEY 2WAY SLVR  5CC 16FR (CATHETERS)
CATH FOLEY 2WAY SLVR 5CC 16FR (CATHETERS) IMPLANT
CLOTH BEACON ORANGE TIMEOUT ST (SAFETY) ×2 IMPLANT
DRAPE CAMERA CLOSED 9X96 (DRAPES) ×2 IMPLANT
ELECT REM PT RETURN 9FT ADLT (ELECTROSURGICAL) ×2
ELECTRODE REM PT RTRN 9FT ADLT (ELECTROSURGICAL) ×1 IMPLANT
GLOVE BIOGEL M 6.5 STRL (GLOVE) ×1 IMPLANT
GLOVE BIOGEL PI IND STRL 6.5 (GLOVE) IMPLANT
GLOVE BIOGEL PI INDICATOR 6.5 (GLOVE) ×2
GLOVE SURG SS PI 8.0 STRL IVOR (GLOVE) ×2 IMPLANT
GOWN PREVENTION PLUS LG XLONG (DISPOSABLE) ×1 IMPLANT
GOWN STRL REIN XL XLG (GOWN DISPOSABLE) ×1 IMPLANT
GOWN STRL REUS W/TWL LRG LVL3 (GOWN DISPOSABLE) ×1 IMPLANT
GOWN STRL REUS W/TWL XL LVL3 (GOWN DISPOSABLE) ×1 IMPLANT
NDL SAFETY ECLIPSE 18X1.5 (NEEDLE) IMPLANT
NEEDLE HYPO 18GX1.5 SHARP (NEEDLE)
NEEDLE HYPO 22GX1.5 SAFETY (NEEDLE) IMPLANT
NS IRRIG 500ML POUR BTL (IV SOLUTION) IMPLANT
PACK CYSTO (CUSTOM PROCEDURE TRAY) ×2 IMPLANT
SYR 20CC LL (SYRINGE) IMPLANT
WATER STERILE IRR 3000ML UROMA (IV SOLUTION) ×2 IMPLANT

## 2014-12-01 NOTE — Interval H&P Note (Signed)
History and Physical Interval Note:  12/01/2014 7:20 AM  [name]  has presented today for surgery, with the diagnosis of BLADDER LESIONS  The various methods of treatment have been discussed with the patient and family. After consideration of risks, benefits and other options for treatment, the patient has consented to  Procedure(s): CYSTOSCOPY WITH BIOPSY/FULGURATION (N/A) as a surgical intervention .  The patient's history has been reviewed, patient examined, no change in status, stable for surgery.  I have reviewed the patient's chart and labs.  Questions were answered to the patient's satisfaction.     [name]

## 2014-12-01 NOTE — Discharge Instructions (Addendum)
CYSTOSCOPY HOME CARE INSTRUCTIONS  Activity: Rest for the remainder of the day.  Do not drive or operate equipment today.  You may resume normal activities in one to two days as instructed by your physician.   Meals: Drink plenty of liquids and eat light foods such as gelatin or soup this evening.  You may return to a normal meal plan tomorrow.  Return to Work: You may return to work in one to two days or as instructed by your physician.  Special Instructions / Symptoms: Call your physician if any of these symptoms occur:   -persistent or heavy bleeding  -bleeding which continues after first few urination  -large blood clots that are difficult to pass  -urine stream diminishes or stops completely  -fever equal to or higher than 101 degrees Farenheit.  -cloudy urine with a strong, foul odor  -severe pain  Females should always wipe from front to back after elimination.  You may feel some burning pain when you urinate.  This should disappear with time.  Applying moist heat to the lower abdomen or a hot tub bath may help relieve the pain. \    You may also get over the counter phenazopyridine (AZO) for the burning if needed.  Follow-Up / Date of Return Visit to Your Physician:  12/14/14 at 1pm Call for an appointment to arrange follow-up.  Patient Signature:  ________________________________________________________  Nurse's Signature:  ________________________________________________________  Post Anesthesia Home Care Instructions  Activity: Get plenty of rest for the remainder of the day. A responsible adult should stay with you for 24 hours following the procedure.  For the next 24 hours, DO NOT: -Drive a car -Paediatric nurse -Drink alcoholic beverages -Take any medication unless instructed by your physician -Make any legal decisions or sign important papers.  Meals: Start with liquid foods such as gelatin or soup. Progress to regular foods as tolerated. Avoid greasy,  spicy, heavy foods. If nausea and/or vomiting occur, drink only clear liquids until the nausea and/or vomiting subsides. Call your physician if vomiting continues.  Special Instructions/Symptoms: Your throat may feel dry or sore from the anesthesia or the breathing tube placed in your throat during surgery. If this causes discomfort, gargle with warm salt water. The discomfort should disappear within 24 hours.

## 2014-12-01 NOTE — Brief Op Note (Signed)
12/01/2014  7:43 AM  PATIENT:  [name]  69 y.o. male  PRE-OPERATIVE DIAGNOSIS:  BLADDER LESIONS  POST-OPERATIVE DIAGNOSIS:  BLADDER LESIONS  PROCEDURE:  Procedure(s): CYSTOSCOPY WITH BIOPSY/FULGURATION (N/A) 1.5cm  SURGEON:  Surgeon(s) and Role:    * Malka So, MD - Primary  PHYSICIAN ASSISTANT:   ASSISTANTS: none   ANESTHESIA:   local and general  EBL:     BLOOD ADMINISTERED:none  DRAINS: none   LOCAL MEDICATIONS USED:  LIDOCAINE  and Amount: 10 ml 2% jelly  SPECIMEN:  Source of Specimen:  left trigone biopsies  DISPOSITION OF SPECIMEN:  PATHOLOGY  COUNTS:  YES  TOURNIQUET:  * No tourniquets in log *  DICTATION: .Other Dictation: Dictation Number [id number]  PLAN OF CARE: Discharge to home after PACU  PATIENT DISPOSITION:  PACU - hemodynamically stable.   Delay start of Pharmacological VTE agent (>24hrs) due to surgical blood loss or risk of bleeding: not applicable

## 2014-12-01 NOTE — Op Note (Signed)
NAME:  [name], [name] NO.:  [id number]  MEDICAL RECORD NO.:  [id number]  LOCATION:                                 FACILITY:  PHYSICIAN:  Marshall Cork. Jeffie Pollock, M.D.    DATE OF BIRTH:  02/07/1946  DATE OF PROCEDURE:  12/01/2014 DATE OF DISCHARGE:  12/01/2014                              OPERATIVE REPORT   PATIENT OF:  Marshall Cork. Jeffie Pollock, M.D.  PROCEDURE:  Cystoscopy with bladder biopsy and fulguration of 1.5 cm lesion from the left trigone.  PREOPERATIVE DIAGNOSIS:  Left bladder wall lesion.  POSTOPERATIVE DIAGNOSIS:  Left bladder wall lesion.  SURGEON:  Marshall Cork. Jeffie Pollock, M.D.  ANESTHESIA:  General.  SPECIMEN:  Biopsies from left trigone.  DRAINS:  None.  BLOOD LOSS:  None.  COMPLICATIONS:  None.  INDICATIONS:  Mr. Bomkamp is a 69 year old white male with recent finding of hematuria.  Office cystoscopy revealed a lesion just lateral to the left ureteral orifice.  It was felt to be most consistent with postradiation hemorrhagic cystitis, but it was felt that biopsy and fulguration was indicated.  FINDINGS AND PROCEDURE:  He was given Cipro.  He was taken to the operating room where general anesthetic was induced.  He was placed in lithotomy position and fitted with PAS hose.  His perineum and genitalia were prepped with Betadine solution.  He was draped in usual sterile fashion.  Cystoscopy was performed using a 22-French scope and the 12 and 70 degree lenses.  Examination revealed a normal urethra.  The external sphincter was intact.  The prostate was absent and there was no bladder neck contraction.  Examination of bladder revealed the ureteral orifices in their normal anatomic position.  There was a lesion approximately 1.5 cm lateral to the left ureteral orifice that was about 1.5 cm in size. It was somewhat stellate shaped and erythematous, most consistent with area of hemorrhagic cystitis but neoplasm needed to be ruled out.  There were additional  telangectasias on the bladder wall but no other worrisome lesions.  No stones were seen.  After initial cystoscopy, two cup biopsies were obtained from the index lesion.  The biopsy sites and surrounding abnormal mucosa were then generously fulgurated with a Bugbee electrode.  The bladder was drained.  The cystoscope was removed.  Urethra was instilled with 10 mL of 2% lidocaine jelly which was secured in place with a gauze wrap.  The patient was taken down from lithotomy position. His anesthetic was reversed.  He was moved to the recovery room in stable condition.  There were no complications.     Marshall Cork. Jeffie Pollock, M.D.     JJW/MEDQ  D:  12/01/2014  T:  12/01/2014  Job:  [id number]

## 2014-12-01 NOTE — Transfer of Care (Signed)
Immediate Anesthesia Transfer of Care Note  Patient: [name]  Procedure(s) Performed: Procedure(s): CYSTOSCOPY WITH BIOPSY/FULGURATION (N/A)  Patient Location: PACU  Anesthesia Type:General  Level of Consciousness: awake, alert , oriented and patient cooperative  Airway & Oxygen Therapy: Patient Spontanous Breathing and Patient connected to nasal cannula oxygen  Post-op Assessment: Report given to PACU RN and Post -op Vital signs reviewed and stable  Post vital signs: Reviewed and stable  Complications: No apparent anesthesia complications

## 2014-12-01 NOTE — Anesthesia Preprocedure Evaluation (Addendum)
Anesthesia Evaluation  Patient identified by MRN, date of birth, ID band Patient awake    Reviewed: Allergy & Precautions, H&P , NPO status , Patient's Chart, lab work & pertinent test results  Airway Mallampati: II  TM Distance: >3 FB Neck ROM: Full    Dental  (+) Teeth Intact, Caps,    Pulmonary neg pulmonary ROS, former smoker,  breath sounds clear to auscultation  Pulmonary exam normal       Cardiovascular hypertension, Pt. on medications and Pt. on home beta blockers + CAD, + Past MI and + Cardiac Stents (X 3 2004) Rate:Normal  Neg exercise Stress Test 6/15   Neuro/Psych  Neuromuscular disease negative psych ROS   GI/Hepatic negative GI ROS, Neg liver ROS,   Endo/Other  negative endocrine ROS  Renal/GU Renal disease   Hx of prostate CA negative genitourinary   Musculoskeletal negative musculoskeletal ROS (+) Arthritis -, Osteoarthritis,    Abdominal   Peds  Hematology negative hematology ROS (+)   Anesthesia Other Findings   Reproductive/Obstetrics negative OB ROS                            Anesthesia Physical  Anesthesia Plan  ASA: III  Anesthesia Plan: General   Post-op Pain Management:    Induction: Intravenous  Airway Management Planned: LMA  Additional Equipment:   Intra-op Plan:   Post-operative Plan: Extubation in OR  Informed Consent: I have reviewed the patients History and Physical, chart, labs and discussed the procedure including the risks, benefits and alternatives for the proposed anesthesia with the patient or authorized representative who has indicated his/her understanding and acceptance.   Dental advisory given  Plan Discussed with: CRNA  Anesthesia Plan Comments:         Anesthesia Quick Evaluation

## 2014-12-01 NOTE — Anesthesia Procedure Notes (Signed)
Procedure Name: LMA Insertion Date/Time: 12/01/2014 7:31 AM Performed by: Wanita Chamberlain Pre-anesthesia Checklist: Patient identified, Timeout performed, Emergency Drugs available, Suction available and Patient being monitored Patient Re-evaluated:Patient Re-evaluated prior to inductionOxygen Delivery Method: Circle system utilized Preoxygenation: Pre-oxygenation with 100% oxygen Intubation Type: IV induction Ventilation: Mask ventilation without difficulty LMA: LMA inserted LMA Size: 4.0 Number of attempts: 1 Airway Equipment and Method: Bite block Placement Confirmation: positive ETCO2 Tube secured with: Tape Dental Injury: Teeth and Oropharynx as per pre-operative assessment

## 2014-12-01 NOTE — Anesthesia Postprocedure Evaluation (Signed)
  Anesthesia Post-op Note  Patient: [name]  Procedure(s) Performed: Procedure(s) (LRB): CYSTOSCOPY WITH BIOPSY/FULGURATION (N/A)  Patient Location: PACU  Anesthesia Type: General  Level of Consciousness: awake and alert   Airway and Oxygen Therapy: Patient Spontanous Breathing  Post-op Pain: mild  Post-op Assessment: Post-op Vital signs reviewed, Patient's Cardiovascular Status Stable, Respiratory Function Stable, Patent Airway and No signs of Nausea or vomiting  Last Vitals:  Filed Vitals:   12/01/14 0800  BP: 125/77  Pulse: 75  Temp: 36.3 C  Resp: 12    Post-op Vital Signs: stable   Complications: No apparent anesthesia complications

## 2014-12-02 ENCOUNTER — Encounter (HOSPITAL_BASED_OUTPATIENT_CLINIC_OR_DEPARTMENT_OTHER): Payer: Self-pay | Admitting: Urology

## 2015-01-02 ENCOUNTER — Other Ambulatory Visit: Payer: Self-pay | Admitting: Urology

## 2015-01-02 ENCOUNTER — Encounter (HOSPITAL_BASED_OUTPATIENT_CLINIC_OR_DEPARTMENT_OTHER): Payer: Self-pay | Admitting: *Deleted

## 2015-01-02 NOTE — Progress Notes (Signed)
Pt instructed npo p mn tonite. To [facility] 2/9 @ 1115.  Needs istat on arrival. ekg w chart.

## 2015-01-03 ENCOUNTER — Encounter (HOSPITAL_BASED_OUTPATIENT_CLINIC_OR_DEPARTMENT_OTHER): Payer: Self-pay | Admitting: *Deleted

## 2015-01-03 ENCOUNTER — Encounter (HOSPITAL_BASED_OUTPATIENT_CLINIC_OR_DEPARTMENT_OTHER)
Admission: RE | Disposition: A | Discharge status: Home / Self Care | Payer: Self-pay | Source: Ambulatory Visit | Attending: Urology

## 2015-01-03 ENCOUNTER — Ambulatory Visit (HOSPITAL_BASED_OUTPATIENT_CLINIC_OR_DEPARTMENT_OTHER): Payer: BLUE CROSS/BLUE SHIELD | Admitting: Anesthesiology

## 2015-01-03 ENCOUNTER — Ambulatory Visit (HOSPITAL_BASED_OUTPATIENT_CLINIC_OR_DEPARTMENT_OTHER)
Admission: RE | Admit: 2015-01-03 | Discharge: 2015-01-03 | Disposition: A | Discharge status: Home / Self Care | Payer: BLUE CROSS/BLUE SHIELD | Source: Ambulatory Visit | Attending: Urology | Admitting: Urology

## 2015-01-03 DIAGNOSIS — I252 Old myocardial infarction: Secondary | ICD-10-CM | POA: Insufficient documentation

## 2015-01-03 DIAGNOSIS — N3041 Irradiation cystitis with hematuria: Secondary | ICD-10-CM | POA: Diagnosis not present

## 2015-01-03 DIAGNOSIS — E780 Pure hypercholesterolemia: Secondary | ICD-10-CM | POA: Insufficient documentation

## 2015-01-03 DIAGNOSIS — M109 Gout, unspecified: Secondary | ICD-10-CM | POA: Insufficient documentation

## 2015-01-03 DIAGNOSIS — Z87442 Personal history of urinary calculi: Secondary | ICD-10-CM | POA: Diagnosis not present

## 2015-01-03 DIAGNOSIS — Z7982 Long term (current) use of aspirin: Secondary | ICD-10-CM | POA: Diagnosis not present

## 2015-01-03 DIAGNOSIS — Z7952 Long term (current) use of systemic steroids: Secondary | ICD-10-CM | POA: Diagnosis not present

## 2015-01-03 DIAGNOSIS — Z79899 Other long term (current) drug therapy: Secondary | ICD-10-CM | POA: Diagnosis not present

## 2015-01-03 DIAGNOSIS — I10 Essential (primary) hypertension: Secondary | ICD-10-CM | POA: Diagnosis not present

## 2015-01-03 DIAGNOSIS — Z8546 Personal history of malignant neoplasm of prostate: Secondary | ICD-10-CM | POA: Insufficient documentation

## 2015-01-03 HISTORY — PX: CYSTOSCOPY: SHX5120

## 2015-01-03 LAB — POCT I-STAT 4, (NA,K, GLUC, HGB,HCT)
Glucose, Bld: 106 mg/dL — ABNORMAL HIGH (ref 70–99)
HEMATOCRIT: 44 % (ref 39.0–52.0)
Hemoglobin: 15 g/dL (ref 13.0–17.0)
Potassium: 4.2 mmol/L (ref 3.5–5.1)
Sodium: 139 mmol/L (ref 135–145)

## 2015-01-03 SURGERY — CYSTOSCOPY
Anesthesia: General | Site: Bladder

## 2015-01-03 MED ORDER — FENTANYL CITRATE 0.05 MG/ML IJ SOLN
25.0000 ug | INTRAMUSCULAR | Status: DC | PRN
  Filled 2015-01-03: qty 1

## 2015-01-03 MED ORDER — ONDANSETRON HCL 4 MG/2ML IJ SOLN
INTRAMUSCULAR | Status: DC | PRN
  Administered 2015-01-03: 4 mg via INTRAVENOUS

## 2015-01-03 MED ORDER — CIPROFLOXACIN IN D5W 400 MG/200ML IV SOLN
INTRAVENOUS | Status: AC
  Filled 2015-01-03: qty 200

## 2015-01-03 MED ORDER — FENTANYL CITRATE 0.05 MG/ML IJ SOLN
INTRAMUSCULAR | Status: AC
  Filled 2015-01-03: qty 6

## 2015-01-03 MED ORDER — STERILE WATER FOR IRRIGATION IR SOLN
Status: DC | PRN
  Administered 2015-01-03: 3000 mL

## 2015-01-03 MED ORDER — ACETAMINOPHEN 650 MG RE SUPP
650.0000 mg | RECTAL | Status: DC | PRN
  Filled 2015-01-03: qty 1

## 2015-01-03 MED ORDER — CIPROFLOXACIN IN D5W 400 MG/200ML IV SOLN
400.0000 mg | INTRAVENOUS | Status: AC
  Administered 2015-01-03: 400 mg via INTRAVENOUS
  Filled 2015-01-03: qty 200

## 2015-01-03 MED ORDER — MIDAZOLAM HCL 2 MG/2ML IJ SOLN
0.5000 mg | Freq: Once | INTRAMUSCULAR | Status: DC | PRN
Start: 2015-01-03 — End: 2015-01-03
  Filled 2015-01-03: qty 2

## 2015-01-03 MED ORDER — ACETAMINOPHEN 325 MG PO TABS
650.0000 mg | ORAL_TABLET | ORAL | Status: DC | PRN
  Filled 2015-01-03: qty 2

## 2015-01-03 MED ORDER — SODIUM CHLORIDE 0.9 % IJ SOLN
3.0000 mL | Freq: Two times a day (BID) | INTRAMUSCULAR | Status: DC
  Filled 2015-01-03: qty 3

## 2015-01-03 MED ORDER — FENTANYL CITRATE 0.05 MG/ML IJ SOLN
INTRAMUSCULAR | Status: DC | PRN
  Administered 2015-01-03: 50 ug via INTRAVENOUS
  Administered 2015-01-03 (×2): 25 ug via INTRAVENOUS

## 2015-01-03 MED ORDER — SODIUM CHLORIDE 0.9 % IJ SOLN
3.0000 mL | INTRAMUSCULAR | Status: DC | PRN
  Filled 2015-01-03: qty 3

## 2015-01-03 MED ORDER — SODIUM CHLORIDE 0.9 % IV SOLN
250.0000 mL | INTRAVENOUS | Status: DC | PRN
  Filled 2015-01-03: qty 250

## 2015-01-03 MED ORDER — MIDAZOLAM HCL 2 MG/2ML IJ SOLN
INTRAMUSCULAR | Status: AC
  Filled 2015-01-03: qty 2

## 2015-01-03 MED ORDER — PROPOFOL 10 MG/ML IV BOLUS
INTRAVENOUS | Status: DC | PRN
  Administered 2015-01-03: 160 mg via INTRAVENOUS

## 2015-01-03 MED ORDER — MEPERIDINE HCL 25 MG/ML IJ SOLN
6.2500 mg | INTRAMUSCULAR | Status: DC | PRN
  Filled 2015-01-03: qty 1

## 2015-01-03 MED ORDER — PROMETHAZINE HCL 25 MG/ML IJ SOLN
6.2500 mg | INTRAMUSCULAR | Status: DC | PRN
  Filled 2015-01-03: qty 1

## 2015-01-03 MED ORDER — OXYCODONE HCL 5 MG PO TABS
5.0000 mg | ORAL_TABLET | ORAL | Status: DC | PRN
  Filled 2015-01-03: qty 2

## 2015-01-03 MED ORDER — LIDOCAINE HCL (CARDIAC) 20 MG/ML IV SOLN
INTRAVENOUS | Status: DC | PRN
  Administered 2015-01-03: 60 mg via INTRAVENOUS

## 2015-01-03 MED ORDER — DEXAMETHASONE SODIUM PHOSPHATE 4 MG/ML IJ SOLN
INTRAMUSCULAR | Status: DC | PRN
  Administered 2015-01-03: 4 mg via INTRAVENOUS

## 2015-01-03 MED ORDER — LACTATED RINGERS IV SOLN
INTRAVENOUS | Status: DC
  Administered 2015-01-03: 12:00:00 via INTRAVENOUS
  Filled 2015-01-03: qty 1000

## 2015-01-03 MED ORDER — MIDAZOLAM HCL 5 MG/5ML IJ SOLN
INTRAMUSCULAR | Status: DC | PRN
  Administered 2015-01-03: .5 mg via INTRAVENOUS
  Administered 2015-01-03: 1 mg via INTRAVENOUS

## 2015-01-03 SURGICAL SUPPLY — 25 items
BAG DRAIN URO-CYSTO SKYTR STRL (DRAIN) ×2 IMPLANT
BAG DRN UROCATH (DRAIN) ×1
CANISTER SUCT LVC 12 LTR MEDI- (MISCELLANEOUS) ×2 IMPLANT
CATH FOLEY 2WAY SLVR  5CC 16FR (CATHETERS)
CATH FOLEY 2WAY SLVR 5CC 16FR (CATHETERS) IMPLANT
CATH URET 5FR 28IN OPEN ENDED (CATHETERS) IMPLANT
CLOTH BEACON ORANGE TIMEOUT ST (SAFETY) ×2 IMPLANT
DRAPE CAMERA CLOSED 9X96 (DRAPES) ×2 IMPLANT
ELECT REM PT RETURN 9FT ADLT (ELECTROSURGICAL) ×2
ELECTRODE REM PT RTRN 9FT ADLT (ELECTROSURGICAL) ×1 IMPLANT
GLOVE INDICATOR 7.5 STRL GRN (GLOVE) ×2 IMPLANT
GLOVE SURG SS PI 7.5 STRL IVOR (GLOVE) ×1 IMPLANT
GLOVE SURG SS PI 8.0 STRL IVOR (GLOVE) ×2 IMPLANT
GOWN PREVENTION PLUS LG XLONG (DISPOSABLE) ×1 IMPLANT
GOWN STRL REIN XL XLG (GOWN DISPOSABLE) ×1 IMPLANT
GOWN STRL REUS W/TWL XL LVL3 (GOWN DISPOSABLE) ×1 IMPLANT
GUIDEWIRE STR DUAL SENSOR (WIRE) IMPLANT
NDL SAFETY ECLIPSE 18X1.5 (NEEDLE) IMPLANT
NEEDLE HYPO 18GX1.5 SHARP (NEEDLE)
NEEDLE HYPO 22GX1.5 SAFETY (NEEDLE) IMPLANT
NS IRRIG 500ML POUR BTL (IV SOLUTION) IMPLANT
PACK CYSTO (CUSTOM PROCEDURE TRAY) ×2 IMPLANT
SYR 20CC LL (SYRINGE) IMPLANT
SYR BULB IRRIGATION 50ML (SYRINGE) ×2 IMPLANT
WATER STERILE IRR 3000ML UROMA (IV SOLUTION) ×2 IMPLANT

## 2015-01-03 NOTE — Brief Op Note (Signed)
01/03/2015  1:15 PM  PATIENT:  [name]  69 y.o. male  PRE-OPERATIVE DIAGNOSIS:  gross hematuria with radiation cystitis  POST-OPERATIVE DIAGNOSIS:  gross hematuria with radiation cystitis  PROCEDURE:  Procedure(s): CYSTOSCOPY WITH FULGURATION OF BLEEDERS (N/A)  SURGEON:  Surgeon(s) and Role:    * Malka So, MD - Primary  PHYSICIAN ASSISTANT:   ASSISTANTS: none   ANESTHESIA:   general  EBL:     BLOOD ADMINISTERED:none  DRAINS: none   LOCAL MEDICATIONS USED:  NONE  SPECIMEN:  No Specimen  DISPOSITION OF SPECIMEN:  N/A  COUNTS:  YES  TOURNIQUET:  * No tourniquets in log *  DICTATION: .Other Dictation: Dictation Number [id number]  PLAN OF CARE: Discharge to home after PACU  PATIENT DISPOSITION:  PACU - hemodynamically stable.   Delay start of Pharmacological VTE agent (>24hrs) due to surgical blood loss or risk of bleeding: yes

## 2015-01-03 NOTE — Anesthesia Postprocedure Evaluation (Signed)
  Anesthesia Post-op Note  Patient: [name]  Procedure(s) Performed: Procedure(s): CYSTOSCOPY WITH FULGURATION OF BLEEDERS (N/A)  Patient Location: PACU  Anesthesia Type:General  Level of Consciousness: awake, alert , oriented and patient cooperative  Airway and Oxygen Therapy: Patient Spontanous Breathing  Post-op Pain: none  Post-op Assessment: Post-op Vital signs reviewed, Patient's Cardiovascular Status Stable, Respiratory Function Stable, Patent Airway, No signs of Nausea or vomiting and Pain level controlled  Post-op Vital Signs: Reviewed and stable  Last Vitals:  Filed Vitals:   01/03/15 1345  BP: 124/91  Pulse: 81  Temp:   Resp: 16    Complications: No apparent anesthesia complications

## 2015-01-03 NOTE — Transfer of Care (Signed)
Immediate Anesthesia Transfer of Care Note  Patient: [name]  Procedure(s) Performed: Procedure(s) (LRB): CYSTOSCOPY WITH FULGURATION OF BLEEDERS (N/A)  Patient Location: PACU  Anesthesia Type: General  Level of Consciousness: awake, oriented, sedated and patient cooperative  Airway & Oxygen Therapy: Patient Spontanous Breathing and Patient connected to face mask oxygen  Post-op Assessment: Report given to PACU RN and Post -op Vital signs reviewed and stable  Post vital signs: Reviewed and stable  Complications: No apparent anesthesia complications

## 2015-01-03 NOTE — H&P (Signed)
Patient Information     Patient Name Sex DOB SSN   [name], [name] Male [id number] [id number]            H&P by Malka So, MD at 11/30/2014 8:08 AM     Author: Malka So, MD Service: Urology Author Type: Physician   Filed: 11/30/2014 8:10 AM Note Time: 11/30/2014 8:08 AM Status: Signed   Editor: Malka So, MD (Physician)         Active Problems  Problems  1. Bilateral kidney stones (N20.0)  2. History of Erectile dysfunction due to arterial insufficiency (N52.01)  3. Gross hematuria (R31.0)  4. Irradiation cystitis (N30.40)  5. Kidney stone on left side (N20.0)  6. Lumbago (M54.5)  7. Muscle weakness (M62.81)  8. Muscular incoordination (R27.8)  9. Nocturia (R35.1)  10. Prostate cancer (C61)  11. Urge and stress incontinence (N39.46)  12. Urinary frequency (R35.0)  History of Present Illness  [name] returns today in f/u for his hematuria w/u. He has had additional spotting and some bladder discomfort. The CT showed non-obstructing bilateral renal stones.  Past Medical History  Problems  1. History of Acute Myocardial Infarction  2. History of Calculus of ureter (N20.1)  3. History of Cancer  4. History of Erectile dysfunction due to arterial insufficiency (N52.01)  5. History of Gout (M10.9)  6. History of Gross hematuria (R31.0)  7. History of cardiac disorder (Z86.79)  8. History of hypercholesterolemia (Z86.39)  9. History of hypertension (Z86.79)  10. Personal history of prostate cancer (Z85.46)  Surgical History  Problems  1. History of Back Surgery  2. History of Back Surgery  3. History of Cystoscopy With Ureteroscopy Right  4. History of Lithotripsy  5. History of Neck Surgery  6. History of Prostatectomy Retropubic Radical With Lymph Node Biopsy(S)  7. History of Surg Penis Insertion Of Penile Prosthesis  8. History of Surg Penis Replacement Of Inflatable Penile Prosthesis  Current Meds  1. Allopurinol 300 MG Oral Tablet;   Therapy: [id number] to Recorded  2. Aspirin 325 MG Oral Tablet;  Therapy: (Recorded:23Sep2014) to Recorded  3. Atorvastatin Calcium 80 MG Oral Tablet;  Therapy: (Recorded:23Sep2014) to Recorded  4. Centrum Silver TABS;  Therapy: (Recorded:09Apr2012) to Recorded  5. Cyclobenzaprine HCl - 10 MG Oral Tablet;  Therapy: 07Oct2015 to Recorded  6. Fish Oil CAPS;  Therapy: (Recorded:09Apr2012) to Recorded  7. Lisinopril 40 MG Oral Tablet;  Therapy: (Recorded:23Sep2014) to Recorded  8. Meloxicam 15 MG Oral Tablet;  Therapy: (Recorded:10Oct2012) to Recorded  9. Metoprolol Tartrate 25 MG Oral Tablet;  Therapy: (Recorded:23Sep2014) to Recorded  10. Neurontin 100 MG Oral Capsule;  Therapy: (Recorded:23Sep2014) to Recorded  11. Nitrostat 0.4 MG Sublingual Tablet Sublingual;  Therapy: [id number] to Recorded  12. Omega-3 CAPS;  Therapy: (Recorded:23Sep2014) to Recorded  13. Pravastatin Sodium 80 MG Oral Tablet;  Therapy: 26Oct2011 to Recorded  14. Triamcinolone Acetonide 0.5 % External Cream;  Therapy: (Recorded:23Sep2014) to Recorded  15. Vitamin B-12 TABS;  Therapy: (Recorded:23Sep2014) to Recorded  16. Vitamin D TABS;  Therapy: (Recorded:09Apr2012) to Recorded  Allergies  Medication  1. No Known Drug Allergies  Family History  Problems  1. Family history of Death In The Family Father   died age 71-heart disease  2. Family history of Death In The Family Mother   died 45-cirrosis of liver  3. Family history of Heart Disease : Father  Social History  Problems  1. Activities of daily  living (ADL's), independent  2. Alcohol Use  3. Caffeine Use  4. Exercise habits   He indicates he and his wife just joined the gym and will be starting a workout in the very  near future. He is very active at work.  5. Former smoker [phone])  6. Marital History - Currently Married  7. Occupation:   Physiological scientist  8. Tobacco Use   quit 7+ yrs ago  Review of Systems  Genitourinary,  constitutional, skin, eye, otolaryngeal, hematologic/lymphatic, cardiovascular, pulmonary, endocrine, musculoskeletal, gastrointestinal, neurological and psychiatric system(s) were reviewed and pertinent findings if present are noted and are otherwise negative.  Genitourinary: hematuria (recent spotting).  Vitals  Vital Signs [Data Includes: Last 1 Day]  Recorded: [id number] 11:32AM  Blood Pressure: 115 / 79  Temperature: 97 F  Heart Rate: 81  Physical Exam  Constitutional: Well nourished and well developed . No acute distress.  Pulmonary: No respiratory distress and normal respiratory rhythm and effort.  Cardiovascular: Heart rate and rhythm are normal . No peripheral edema.  Results/Data  Urine [Data Includes: Last 1 Day]      [id number]     COLOR  YELLOW     APPEARANCE  CLEAR     SPECIFIC GRAVITY  1.020     pH  5.0     GLUCOSE  NEG mg/dL     BILIRUBIN  NEG     KETONE  NEG mg/dL     BLOOD  NEG     PROTEIN  NEG mg/dL     UROBILINOGEN  0.2 mg/dL     NITRITE  NEG     LEUKOCYTE ESTERASE  NEG          Selected Results      AU CT-HEMATURIA PROTOCOL  12Oct2015 12:00AM  Irine Seal      Test Name  Result  Flag  Reference      AU CT-HEMATURIA PROTOCOL  (Report)        ** RADIOLOGY REPORT BY [facility] **  CLINICAL DATA: Gross and microscopic hematuria. Gross hematuria 2  weeks ago, lasting for 1 day. Prostatectomy in 2001 for prostate  carcinoma. Radiation therapy in 2013. History of renal stones.  Lithotripsy. Initial encounter.  EXAM:  CT ABDOMEN AND PELVIS WITHOUT AND WITH CONTRAST  TECHNIQUE:  Multidetector CT imaging of the abdomen and pelvis was performed  following the standard protocol before and following the bolus  administration of intravenous contrast.  CONTRAST: 125 cc Isovue-300  COMPARISON: Plain films of 08/31/2014. CT of 08/07/2010.  FINDINGS:  Lower chest: Clear lung bases. Mild cardiomegaly with multivessel  coronary artery atherosclerosis. No  pericardial or pleural effusion.  Hepatobiliary: Mild hepatic steatosis, without focal liver lesion.  Normal gallbladder, without biliary ductal dilatation.  Pancreas: Normal, without mass or pancreatic ductal dilatation.  Spleen: Normal  Adrenals/Urinary Tract: Minimal adrenal nodularity bilaterally.  Slightly greater on the right. Similar.  Bilateral renal collecting system calculi. The largest stone or  stone conglomerate is in the lower pole left renal collecting system  and measures 1.5 cm. No hydronephrosis. No hydroureter or ureteric  calculi. Mild right renal cortical thinning. Bilateral subcentimeter  renal cysts. Moderate renal collecting system opacification on  delayed images. Portions of the distal ureters are incompletely  opacified on delayed images. No filling defects in the opacified  portions. No bladder filling defect or enhancing mass. No bladder  calculi.  Stomach/Bowel: Underdistended proximal stomach. Scattered colonic  diverticula. Normal small bowel without abdominal ascites.  Vascular/Lymphatic: Advanced aortic and branch vessel  atherosclerosis. No retroperitoneal or retrocrural adenopathy. No  pelvic adenopathy.  Reproductive: Status post prostatectomy, without locally recurrent  disease.  Other: Fat containing right inguinal hernia. Penile pump. No  significant free fluid.  Musculoskeletal: Degenerative disc disease at the lumbosacral  junction. Advanced spondylosis.  IMPRESSION:  1. Bilateral nephrolithiasis. No other explanation for hematuria.  2. Status post prostatectomy, without locally recurrent or  metastatic disease.  3. Hepatic steatosis.  4. Atherosclerosis, including within the coronary arteries.  Electronically Signed  By: Abigail Miyamoto M.D.  On: 09/05/2014 10:55      Procedure  Procedure: Cystoscopy  Indication: Hematuria.  Informed Consent: Risks, benefits, and potential adverse events were discussed and informed consent was obtained  from the patient.  Prep: The patient was prepped with betadine.  Anesthesia:. Local anesthesia was administered intraurethrally with 2% lidocaine jelly.  Antibiotic prophylaxis: Ciprofloxacin.  Procedure Note:  Urethral meatus:. No abnormalities.  Anterior urethra: No abnormalities.  Prostatic urethra:. Absent. there are some radiation changes at the bladder neck.  Bladder: Visulization was clear. The ureteral orifices were in the normal anatomic position bilaterally and had clear efflux of urine. A systematic survey of the bladder demonstrated no bladder tumors or stones. Examination of the bladder demonstrated mild trabeculation erythematous mucosa (1 x 1.5cm posterior to the left UA there is erythema that is raised and is most consistent with radiation cystitis but a neoplasm is not excluded. He has other small telangectasias. ). The patient tolerated the procedure well.  Complications: None.  Assessment  Assessed  1. Gross hematuria (R31.0)  2. 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  He has non-obstructing stones on CT.  Plan  Gross hematuria  1. Follow-up Schedule Surgery Office Follow-up Status: Hold For - Appointment  Requested for: [id number]  Health Maintenance  2. UA With REFLEX; [Do Not Release]; Status:Resulted - Requires Verification; Done:  [id number] 11:19AM  I will get him set up for the biopsy and reviewed the risks of bleeding, infection, bladder injury, strictures, thrombotic events and anesthetic complications.  Discussion/Summary  CC: Dr. Walker Kehr.        He had the biopsy done which just showed radiation changes and inflammation but he has had recurrent bleeding and I am going to look back in to re-fulgurate.

## 2015-01-03 NOTE — Anesthesia Procedure Notes (Signed)
Procedure Name: LMA Insertion Date/Time: 01/03/2015 12:58 PM Performed by: Denna Haggard D Pre-anesthesia Checklist: Patient identified, Emergency Drugs available, Suction available and Patient being monitored Patient Re-evaluated:Patient Re-evaluated prior to inductionOxygen Delivery Method: Circle System Utilized Preoxygenation: Pre-oxygenation with 100% oxygen Intubation Type: IV induction Ventilation: Mask ventilation without difficulty LMA: LMA inserted LMA Size: 4.0 Number of attempts: 1 Airway Equipment and Method: Bite block Placement Confirmation: positive ETCO2 Tube secured with: Tape Dental Injury: Teeth and Oropharynx as per pre-operative assessment

## 2015-01-03 NOTE — Discharge Instructions (Addendum)
CYSTOSCOPY HOME CARE INSTRUCTIONS  Activity: Rest for the remainder of the day.  Do not drive or operate equipment today.  You may resume normal activities in one to two days as instructed by your physician.   Meals: Drink plenty of liquids and eat light foods such as gelatin or soup this evening.  You may return to a normal meal plan tomorrow.  Return to Work: You may return to work in one to two days or as instructed by your physician.  Special Instructions / Symptoms: Call your physician if any of these symptoms occur:   -persistent or heavy bleeding  -bleeding which continues after first few urination  -large blood clots that are difficult to pass  -urine stream diminishes or stops completely  -fever equal to or higher than 101 degrees Farenheit.  -cloudy urine with a strong, foul odor  -severe pain  Females should always wipe from front to back after elimination.  You may feel some burning pain when you urinate.  This should disappear with time.  Applying moist heat to the lower abdomen or a hot tub bath may help relieve the pain. \  I would like for you to stay off of aspirin, fish oil, ibuprofen and meloxicam for the next 2 weeks if possible since they can make you more prone to bleeding.     Patient Signature:  ________________________________________________________  Nurse's Signature:  ________________________________________________________   Post Anesthesia Home Care Instructions  Activity: Get plenty of rest for the remainder of the day. A responsible adult should stay with you for 24 hours following the procedure.  For the next 24 hours, DO NOT: -Drive a car -Paediatric nurse -Drink alcoholic beverages -Take any medication unless instructed by your physician -Make any legal decisions or sign important papers.  Meals: Start with liquid foods such as gelatin or soup. Progress to regular foods as tolerated. Avoid greasy, spicy, heavy foods. If nausea and/or  vomiting occur, drink only clear liquids until the nausea and/or vomiting subsides. Call your physician if vomiting continues.  Special Instructions/Symptoms: Your throat may feel dry or sore from the anesthesia or the breathing tube placed in your throat during surgery. If this causes discomfort, gargle with warm salt water. The discomfort should disappear within 24 hours.

## 2015-01-03 NOTE — Anesthesia Preprocedure Evaluation (Addendum)
Anesthesia Evaluation  Patient identified by MRN, date of birth, ID band Patient awake    Reviewed: Allergy & Precautions, NPO status , Patient's Chart, lab work & pertinent test results  History of Anesthesia Complications (+) PONV and history of anesthetic complications  Airway Mallampati: II  TM Distance: >3 FB Neck ROM: Full    Dental  (+) Teeth Intact, Caps, Dental Advisory Given   Pulmonary former smoker (quit '06),  breath sounds clear to auscultation        Cardiovascular hypertension, Pt. on medications and Pt. on home beta blockers - angina+ CAD, + Past MI and + Cardiac Stents (BMS, DES Cx in '04) Rhythm:Regular Rate:Normal  '15 ETT: no ischemia or st changes; low risk nuclear study  '11 ECHO: EF 60-65%, valves Ok   Neuro/Psych negative neurological ROS     GI/Hepatic negative GI ROS, Neg liver ROS,   Endo/Other  negative endocrine ROS  Renal/GU Renal disease (stones)     Musculoskeletal  (+) Arthritis -,   Abdominal   Peds  Hematology negative hematology ROS (+)   Anesthesia Other Findings   Reproductive/Obstetrics                          Anesthesia Physical Anesthesia Plan  ASA: III  Anesthesia Plan: General   Post-op Pain Management:    Induction: Intravenous  Airway Management Planned: LMA  Additional Equipment:   Intra-op Plan:   Post-operative Plan:   Informed Consent:   Dental advisory given  Plan Discussed with: CRNA and Surgeon  Anesthesia Plan Comments: (Plan routine monitors, GA- LMA OK)        Anesthesia Quick Evaluation

## 2015-01-04 ENCOUNTER — Encounter (HOSPITAL_BASED_OUTPATIENT_CLINIC_OR_DEPARTMENT_OTHER): Payer: Self-pay | Admitting: Urology

## 2015-01-04 NOTE — Op Note (Signed)
NAME:  BILLEY, WOJCIAK NO.:  [id number]  MEDICAL RECORD NO.:  [id number]  LOCATION:                                 FACILITY:  PHYSICIAN:  Marshall Cork. Jeffie Pollock, M.D.    DATE OF BIRTH:  [birth year]  DATE OF PROCEDURE:  01/03/2015 DATE OF DISCHARGE:  01/03/2015                              OPERATIVE REPORT   PROCEDURE:  Cystoscopy with fulguration of bleeders.  PREOPERATIVE DIAGNOSIS:  Radiation cystitis with gross hematuria.  POSTOPERATIVE DIAGNOSIS:  Radiation cystitis with gross hematuria.  SURGEON:  Marshall Cork. Jeffie Pollock, MD  ANESTHESIA:  General.  SPECIMEN:  None.  DRAINS:  None.  COMPLICATIONS:  None.  BLOOD LOSS:  None.  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  He had a lesion noted on his bladder during an evaluation for hematuria a few weeks ago and underwent a biopsy approximately a month ago.  This was proved to be inflammatory changes consistent with radiation cystitis.  He did also have diffuse telangiectasias consistent with radiation.  He had done well until approximately a week ago when he began to have recurrent bleeding.  He was seen in the office earlier this week and was catheterized and irrigated, but at that time, the bleeding had stopped.  However later that day, the bleeding recurred and he still has intermittent bleeding, so we elected to take him to the operating room for further inspection and fulguration.  FINDINGS AND PROCEDURE:  He was given Cipro.  He was taken to the operating room where general anesthetic was induced.  He was placed in lithotomy position.  His perineum and genitalia were prepped with Betadine solution.  He was draped in usual sterile fashion.  Cystoscopy was performed using a 22-French scope and 12-degree lens. Examination revealed a normal urethra.  The external sphincter was intact.   The prostate was absent.  Examination of bladder revealed ureteral orifices in the normal anatomic position.  The prior biopsy site was just lateral to the left ureteral orifice.  There was a little bit of adherent clot, but no active bleeding was noted in that area. There were other diffuse areas of telangiectasia that had some hemorrhagic changes, but no active bleeding. Initially, I used a Bugbee electrode to re-fulgurate the prior biopsy site.  Another small area lateral to the left ureteral orifice appeared to have had recent hemorrhage and that was fulgurated.  On withdrawal of the scope, I noticed a more a pulsatile type bleeder from the anterior bladder neck just to the left of midline.  I then fulgurated this lesion with cessation of bleeding.  At this point, there was no active bleeding and no lesions that appeared worrisome for high risk of active bleeding, so the scope was removed after the bladder was partially drained.  The patient was taken down from lithotomy position.  His anesthetic was reversed.  He was moved to recovery in stable condition.  There were no complications.     Marshall Cork. Jeffie Pollock, M.D.     JJW/MEDQ  D:  01/03/2015  T:  01/04/2015  Job:  [id number] 

## 2015-01-10 ENCOUNTER — Encounter: Payer: Self-pay | Admitting: Internal Medicine

## 2015-01-10 ENCOUNTER — Ambulatory Visit (INDEPENDENT_AMBULATORY_CARE_PROVIDER_SITE_OTHER): Payer: BLUE CROSS/BLUE SHIELD | Admitting: Internal Medicine

## 2015-01-10 VITALS — BP 120/80 | HR 94 | Temp 98.6°F | Ht 70.5 in | Wt 219.0 lb

## 2015-01-10 DIAGNOSIS — C679 Malignant neoplasm of bladder, unspecified: Secondary | ICD-10-CM | POA: Insufficient documentation

## 2015-01-10 DIAGNOSIS — Z23 Encounter for immunization: Secondary | ICD-10-CM

## 2015-01-10 DIAGNOSIS — M150 Primary generalized (osteo)arthritis: Secondary | ICD-10-CM

## 2015-01-10 DIAGNOSIS — M159 Polyosteoarthritis, unspecified: Secondary | ICD-10-CM

## 2015-01-10 DIAGNOSIS — I10 Essential (primary) hypertension: Secondary | ICD-10-CM

## 2015-01-10 DIAGNOSIS — Z0000 Encounter for general adult medical examination without abnormal findings: Secondary | ICD-10-CM | POA: Insufficient documentation

## 2015-01-10 DIAGNOSIS — C670 Malignant neoplasm of trigone of bladder: Secondary | ICD-10-CM

## 2015-01-10 MED ORDER — METHYLPREDNISOLONE ACETATE 80 MG/ML IJ SUSP
80.0000 mg | Freq: Once | INTRAMUSCULAR | Status: AC
  Administered 2015-01-10: 80 mg via INTRAMUSCULAR

## 2015-01-10 MED ORDER — TRAMADOL HCL 50 MG PO TABS: 50.0000 mg | ORAL_TABLET | Freq: Four times a day (QID) | ORAL | Status: DC | PRN

## 2015-01-10 NOTE — Assessment & Plan Note (Signed)
We discussed age appropriate health related issues, including available/recomended screening tests and vaccinations. We discussed a need for adhering to healthy diet and exercise. Labs/EKG were reviewed/ordered. All questions were answered.   

## 2015-01-10 NOTE — Patient Instructions (Signed)
Contour pillow Memory foam matrass pad

## 2015-01-10 NOTE — Assessment & Plan Note (Signed)
Continue with current prescription therapy as reflected on the Med list.  

## 2015-01-10 NOTE — Progress Notes (Signed)
Subjective:    HPI   The patient is here for a wellness exam.   The patient needs to address  chronic hypertension that has been well controlled with medicines; to address chronic  hyperlipidemia controlled with medicines as well; and to address type 2 chronic diabetes, controlled with medical treatment and diet.  F/u new bladder cancer cauterized by Dr Jeffie Pollock (he had a bleeding complication) (6/38, 1/77). He was taken off Meloxicam due to bleed: OA is flaring up - papain is 10/10 F/u neck pain - [facility]. Using Tramadol prn - very little - 1/2 tab bid  Well exam/labs at [facility] > 1 year  The patient presents for a follow-up of  chronic hypertension, chronic dyslipidemia, OA, CAD controlled with medicines. He finished XRT, Dr Miquel Dunn has d/c'd him. Seeing Dr Jeffie Pollock q 6 mo. Chol med was changed by [place]  He had lithotripsy a few months ago  Wt Readings from Last 3 Encounters:  01/10/15 219 lb (99.338 kg)  01/03/15 218 lb (98.884 kg)  12/01/14 218 lb (98.884 kg)   BP Readings from Last 3 Encounters:  01/10/15 120/80  01/03/15 126/86  12/01/14 122/77      Review of Systems  Constitutional: Negative for appetite change, fatigue and unexpected weight change.  HENT: Negative for nosebleeds and trouble swallowing.   Eyes: Negative for itching and visual disturbance.  Cardiovascular: Negative for palpitations and leg swelling.  Gastrointestinal: Negative for blood in stool and abdominal distention.  Genitourinary: Negative for frequency and hematuria.  Musculoskeletal: Negative for back pain, joint swelling and gait problem.  Neurological: Negative for dizziness, tremors, speech difficulty and weakness.  Psychiatric/Behavioral: Negative for sleep disturbance, dysphoric mood and agitation. The patient is not nervous/anxious.        Objective:   Physical Exam  Constitutional: He is oriented to person, place, and time. He appears well-developed.  obese  HENT:  Mouth/Throat:  Oropharynx is clear and moist.  Eyes: Conjunctivae are normal. Pupils are equal, round, and reactive to light.  Neck: Normal range of motion. No JVD present. No thyromegaly present.  Cardiovascular: Normal rate, regular rhythm, normal heart sounds and intact distal pulses.  Exam reveals no gallop and no friction rub.   No murmur heard. Pulmonary/Chest: Effort normal and breath sounds normal. No respiratory distress. He has no wheezes. He has no rales. He exhibits no tenderness.  Abdominal: Soft. Bowel sounds are normal. He exhibits no distension and no mass. There is no tenderness. There is no rebound and no guarding.  Musculoskeletal: Normal range of motion. He exhibits no edema or tenderness.  Lymphadenopathy:    He has no cervical adenopathy.  Neurological: He is alert and oriented to person, place, and time. He has normal reflexes. No cranial nerve deficit. He exhibits normal muscle tone. Coordination normal.  Skin: Skin is warm and dry. No rash noted.  Psychiatric: He has a normal mood and affect. His behavior is normal. Judgment and thought content normal.  Neck, LS is tender w/ROM   Lab Results  Component Value Date   WBC 6.7 02/10/2013   HGB 15.0 01/03/2015   HCT 44.0 01/03/2015   PLT 156 02/10/2013   GLUCOSE 106* 01/03/2015   CHOL 157 04/14/2013   TRIG 130.0 04/14/2013   HDL 57.50 04/14/2013   LDLCALC 74 04/14/2013   ALT 21 04/14/2013   AST 45* 04/14/2013   NA 139 01/03/2015   K 4.2 01/03/2015   CL 104 10/06/2013   CREATININE  1.4 10/06/2013   BUN 18 10/06/2013   CO2 30 10/06/2013   TSH 1.82 08/09/2011   INR 1.12 01/19/2010   HGBA1C 5.8 08/09/2011    Labs from [facility] 12/03/14     Assessment & Plan:

## 2015-01-10 NOTE — Progress Notes (Signed)
Pre visit review using our clinic review tool, if applicable. No additional management support is needed unless otherwise documented below in the visit note. 

## 2015-01-10 NOTE — Assessment & Plan Note (Signed)
Tramadol - use more - see Rx

## 2015-01-11 ENCOUNTER — Encounter: Payer: BC Managed Care – PPO | Admitting: Internal Medicine

## 2015-01-11 ENCOUNTER — Telehealth: Payer: Self-pay | Admitting: Internal Medicine

## 2015-01-11 NOTE — Telephone Encounter (Signed)
emmi emailed °

## 2015-02-14 ENCOUNTER — Encounter: Payer: Self-pay | Admitting: Internal Medicine

## 2015-02-14 ENCOUNTER — Ambulatory Visit (INDEPENDENT_AMBULATORY_CARE_PROVIDER_SITE_OTHER): Payer: BLUE CROSS/BLUE SHIELD | Admitting: Internal Medicine

## 2015-02-14 VITALS — BP 130/70 | HR 102 | Wt 218.0 lb

## 2015-02-14 DIAGNOSIS — M25562 Pain in left knee: Secondary | ICD-10-CM

## 2015-02-14 MED ORDER — METHYLPREDNISOLONE ACETATE 40 MG/ML IJ SUSP
80.0000 mg | Freq: Once | INTRAMUSCULAR | Status: AC
  Administered 2015-02-16: 80 mg via INTRA_ARTICULAR

## 2015-02-14 NOTE — Progress Notes (Signed)
Pre visit review using our clinic review tool, if applicable. No additional management support is needed unless otherwise documented below in the visit note. 

## 2015-02-14 NOTE — Progress Notes (Addendum)
Subjective:    Knee Pain  The incident occurred 12 to 24 hours ago. The incident occurred at work. Injury mechanism: turned and fell. The pain is present in the left leg and left knee. The quality of the pain is described as aching. The pain is moderate. The pain has been intermittent since onset. Pertinent negatives include no inability to bear weight or loss of motion. He reports no foreign bodies present. The symptoms are aggravated by weight bearing and movement. He has tried ice and rest for the symptoms. The treatment provided no relief.       The patient needs to address  chronic hypertension that has been well controlled with medicines; to address chronic  hyperlipidemia controlled with medicines as well; and to address type 2 chronic diabetes, controlled with medical treatment and diet.  F/u new bladder cancer cauterized by Dr Jeffie Pollock (he had a bleeding complication) (9/52, 8/41). He was taken off Meloxicam due to bleed: OA is flaring up - papain is 10/10 F/u neck pain - [facility]. Using Tramadol prn - very little - 1/2 tab bid  Well exam/labs at [facility] > 1 year  The patient presents for a follow-up of  chronic hypertension, chronic dyslipidemia, OA, CAD controlled with medicines. He finished XRT, Dr Miquel Dunn has d/c'd him. Seeing Dr Jeffie Pollock q 6 mo. Chol med was changed by [place]  He had lithotripsy a few months ago  Wt Readings from Last 3 Encounters:  02/14/15 218 lb (98.884 kg)  01/10/15 219 lb (99.338 kg)  01/03/15 218 lb (98.884 kg)   BP Readings from Last 3 Encounters:  02/14/15 130/70  01/10/15 120/80  01/03/15 126/86      Review of Systems  Constitutional: Negative for appetite change, fatigue and unexpected weight change.  HENT: Negative for nosebleeds and trouble swallowing.   Eyes: Negative for itching and visual disturbance.  Cardiovascular: Negative for palpitations and leg swelling.  Gastrointestinal: Negative for blood in stool and abdominal distention.   Genitourinary: Negative for frequency and hematuria.  Musculoskeletal: Negative for back pain, joint swelling and gait problem.  Neurological: Negative for dizziness, tremors, speech difficulty and weakness.  Psychiatric/Behavioral: Negative for sleep disturbance, dysphoric mood and agitation. The patient is not nervous/anxious.        Objective:   Physical Exam  Constitutional: He is oriented to person, place, and time. He appears well-developed.  obese  HENT:  Mouth/Throat: Oropharynx is clear and moist.  Eyes: Conjunctivae are normal. Pupils are equal, round, and reactive to light.  Neck: Normal range of motion. No JVD present. No thyromegaly present.  Cardiovascular: Normal rate, regular rhythm, normal heart sounds and intact distal pulses.  Exam reveals no gallop and no friction rub.   No murmur heard. Pulmonary/Chest: Effort normal and breath sounds normal. No respiratory distress. He has no wheezes. He has no rales. He exhibits no tenderness.  Abdominal: Soft. Bowel sounds are normal. He exhibits no distension and no mass. There is no tenderness. There is no rebound and no guarding.  Musculoskeletal: Normal range of motion. He exhibits no edema or tenderness.  Lymphadenopathy:    He has no cervical adenopathy.  Neurological: He is alert and oriented to person, place, and time. He has normal reflexes. No cranial nerve deficit. He exhibits normal muscle tone. Coordination normal.  Skin: Skin is warm and dry. No rash noted.  Psychiatric: He has a normal mood and affect. His behavior is normal. Judgment and thought content normal.  Neck, LS is tender w/ROM  L knee is tender - no edema   Lab Results  Component Value Date   WBC 6.7 02/10/2013   HGB 15.0 01/03/2015   HCT 44.0 01/03/2015   PLT 156 02/10/2013   GLUCOSE 106* 01/03/2015   CHOL 157 04/14/2013   TRIG 130.0 04/14/2013   HDL 57.50 04/14/2013   LDLCALC 74 04/14/2013   ALT 21 04/14/2013   AST 45* 04/14/2013   NA 139  01/03/2015   K 4.2 01/03/2015   CL 104 10/06/2013   CREATININE 1.4 10/06/2013   BUN 18 10/06/2013   CO2 30 10/06/2013   TSH 1.82 08/09/2011   INR 1.12 01/19/2010   HGBA1C 5.8 08/09/2011    Labs from [place] 12/03/14   Procedure Note :     Procedure : Joint Injection, L  knee   Indication:  Joint osteoarthritis with refractory  chronic pain.   Risks including unsuccessful procedure , bleeding, infection, bruising, skin atrophy, "steroid flare-up" and others were explained to the patient in detail as well as the benefits. Informed consent was obtained and signed.   Tthe patient was placed in a comfortable position. Lateral approach was used. Skin was prepped with Betadine and alcohol  and anesthetized a cooling spray. Then, a 5 cc syringe with a 1.5 inch long 25-gauge needle was used for a joint injection.. The needle was advanced  Into the knee joint cavity. I aspirated a small amount of intra-articular fluid to confirm correct placement of the needle and injected the joint with 5 mL of 2% lidocaine and 80 mg of Depo-Medrol .  Band-Aid was applied.   Tolerated well. Complications: None. Good pain relief following the procedure.   Postprocedure instructions :    A Band-Aid should be left on for 12 hours. Injection therapy is not a cure itself. It is used in conjunction with other modalities. You can use nonsteroidal anti-inflammatories like ibuprofen , hot and cold compresses. Rest is recommended in the next 24 hours. You need to report immediately  if fever, chills or any signs of infection develop.      Assessment & Plan:

## 2015-02-14 NOTE — Patient Instructions (Signed)
Postprocedure instructions :    A Band-Aid should be left on for 12 hours. Injection therapy is not a cure itself. It is used in conjunction with other modalities. You can use nonsteroidal anti-inflammatories like ibuprofen , hot and cold compresses. Rest is recommended in the next 24 hours. You need to report immediately  if fever, chills or any signs of infection develop. 

## 2015-02-16 DIAGNOSIS — M25562 Pain in left knee: Secondary | ICD-10-CM | POA: Diagnosis not present

## 2015-02-26 NOTE — Assessment & Plan Note (Signed)
L knee pain - will inject

## 2015-02-26 NOTE — Addendum Note (Signed)
Addended by: Cassandria Anger on: 02/26/2015 10:09 PM   Modules accepted: Miquel Dunn

## 2015-04-19 NOTE — Progress Notes (Signed)
Chief Complaint  Patient presents with  . Chest Pain   History of Present Illness: 69 yo male with history of CAD, s/p posterior wall MI in 2004 treated with overlapping stents in the Circumflex (one Zeta stent and one Taxus DES), s/p cutting balloon angioplasty of the CFX secondary to ISR in 12/2009, HLD, HTN, gout, chronic kidney disease here today for follow up. He has been followed in the past by Dr. Verl Blalock. Last stress perfusion study 03/2011 showed prior inferior and inferolateral defect consistent with prior MI but no ischemia. Exercise stress test in 2015 with no ischemia.   He is here today for cardiac follow up. He continues to have occasional mild chest pains at rest. No exertional chest pains. He has not been exercising.    Primary Care Physician: Plotnikov  Last Lipid Profile:Lipid Panel (last checked in the [facility] 2015-scanned in)  Past Medical History  Diagnosis Date  . GOUT 06/14/2008  . S/P radiation therapy 10/02/11 - 11/25/11    Central Lower Pelvis: 5009 cGy/38 Fractions--  for recurrent prostate cancer  . PONV (postoperative nausea and vomiting)   . History of myocardial infarction     04-29-2003  -- posterior and lateral wall MI  s/p  stent x2 to  left mid CFX  . Hypertension   . S/P coronary artery stent placement     BMS x1  and DES x1  to mid left CFX  04-29-2003  . Hyperlipidemia   . H/O agent Orange exposure   . Diverticulosis of colon   . Nephrolithiasis     bilateral  . History of kidney stones   . Lesion of bladder   . Organic impotence     arterial insuff.  . SUI (stress urinary incontinence), male   . CORONARY ARTERY DISEASE CARDIOLOGIST--  DR WALL AND DR Timothea Bodenheimer    ETT  05-06-2014,  no ischemia or st changes;  low risk nuclear study 04-23-2011--- a. s/p post wall MI  2004 => overlapping stents in the CFX (one Zeta stent and one Taxus DES);  b. [place] => LHC 2/11: Normal LM, normal LAD, normal Dx, CFX 80% ISR, PDA 30-40%, EF 55%. PCI: Cutting balloon  angioplasty to the CFX ISR;  c.  Lex MV 5/12: inf and IL defect c/w prior MI, no ischemia, EF 54%  . Gross hematuria   . DDD (degenerative disc disease), lumbar   . OA (osteoarthritis)     neck and shoulders  . At risk for sleep apnea     STOP-BANG= 5    SENT TO PCP 11-23-2014  . Wears glasses   . Recurrent prostate adenocarcinoma urologist--  dr Irine Seal    first dx  2001,  T2b  N0  M0,  Gleason 6--  s/p  radical prostatectomy/   recurrent 11/ 2012  s/p salvage radiation   . Bladder cancer     2016    Past Surgical History  Procedure Laterality Date  . Lumbar laminectomy  04-25-2011  . Penile prosthesis placement  2003  . Penile prosthesis implant N/A 02/16/2013    Procedure: REPLACEMENT OF AMS PENILE PROTHESIS INFLATABLE;  Surgeon: Malka So, MD;  Location: [facility];  Service: Urology;  Laterality: N/A;  . Radical prostatectomy w/ bilateral pelvic lymphadenectomy  08-07-2000  . Inguinal hernia repair Left 11-03-2003  . Cysto w/ placement right ureteral stent  07-19-2004  . Right ureteroscopic laser lithotripsy stone extraction and stent  07-26-2004  . Extracorporeal shock wave lithotripsy  Left 09-16-2013  . Cardiovascular stress test  04-23-2011  dr Angelena Form    Low risk nuclear study/ fixed mixed basal inferior and inferolateral perfusion defect with wall motion abnormality that suggest prior MI, no significant ishemia/  ef 54%  . Transthoracic echocardiogram  01-21-2010    mild LVH/  ef 60-65%/  trivial TR  . Coronary angioplasty with stent placement  04-29-2003  dr Lyndel Safe    BMS x1 and DES x1 to mid left CFX for total occlusion 99%/  nonobstructive dLAD 60% and pLAD30%,  pRCA 30%,  lateral and mid inferior akinesis,  moderate irregularities LM,  D2 and D3 40%,  ef 50%  . Coronary angioplasty  01-22-2010  dr Darnell Level brodie    successfully cutting angioplasty to in-stent restenosis  mCFX  . Anterior cervical decomp/discectomy fusion  2001    w/ removal bone spur--  1 level  .  Lumbar disc surgery  1995  . Colonoscopy  03-04-2007  . Cystoscopy with biopsy N/A 12/01/2014    Procedure: CYSTOSCOPY WITH BIOPSY/FULGURATION;  Surgeon: Malka So, MD;  Location: [facility];  Service: Urology;  Laterality: N/A;  . Cystoscopy N/A 01/03/2015    Procedure: CYSTOSCOPY WITH FULGURATION OF BLEEDERS;  Surgeon: Malka So, MD;  Location: [facility];  Service: Urology;  Laterality: N/A;    Current Outpatient Prescriptions  Medication Sig Dispense Refill  . allopurinol (ZYLOPRIM) 300 MG tablet Take 300 mg by mouth every evening.     [name] Kitchen aspirin 81 MG tablet Take 81 mg by mouth daily.    [name] Kitchen atorvastatin (LIPITOR) 80 MG tablet Take 40 mg by mouth every evening. Takes 1/2 tablet    . Cholecalciferol (VITAMIN D3) 1000 UNITS CAPS Take 1 capsule by mouth daily.     [name] Kitchen gabapentin (NEURONTIN) 300 MG capsule Take 300 mg by mouth at bedtime.    [name] Kitchen lisinopril (PRINIVIL,ZESTRIL) 40 MG tablet Take 20 mg by mouth every evening. Takes 1/2 tablet    . meloxicam (MOBIC) 15 MG tablet Take 15 mg by mouth daily.    . metoprolol tartrate (LOPRESSOR) 25 MG tablet Take 12.5 mg by mouth every evening. Takes 1/2 tablet    . Multiple Vitamin (MULTIVITAMIN WITH MINERALS) TABS Take 1 tablet by mouth daily.    . nitroGLYCERIN (NITROSTAT) 0.4 MG SL tablet Place 1 tablet (0.4 mg total) under the tongue every 5 (five) minutes as needed for chest pain. 20 tablet 5  . traMADol (ULTRAM) 50 MG tablet Take 1-2 tablets (50-100 mg total) by mouth every 6 (six) hours as needed for moderate pain or severe pain. 240 tablet 2  . vitamin B-12 (CYANOCOBALAMIN) 500 MCG tablet Take 500 mcg by mouth daily.     No current facility-administered medications for this visit.    No Known Allergies  History   Social History  . Marital Status: Married    Spouse Name: N/A  . Number of Children: 5  . 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 Smoking status: Former Smoker -- 1.50 packs/day for 40 years    Types: Cigarettes    Quit date: 11/28/2004  . Smokeless tobacco: Never Used  . Alcohol Use: 12.6 oz/week    21 Shots of liquor per week     Comment: 3 oz  per daily  . Drug Use: No  . Sexual Activity: Yes   Other Topics Concern  . Not  on file   Social History Narrative    Family History  Problem Relation Age of Onset  . Hypertension Other   . Heart attack Father     Review of Systems:  As stated in the HPI and otherwise negative.   BP 98/68 mmHg  Pulse 89  Ht 5' 10.5" (1.791 m)  Wt 215 lb 12.8 oz (97.886 kg)  BMI 30.52 kg/m2  Physical Examination: General: Well developed, well nourished, NAD HEENT: OP clear, mucus membranes moist SKIN: warm, dry. No rashes. Neuro: No focal deficits Musculoskeletal: Muscle strength 5/5 all ext Psychiatric: Mood and affect normal Neck: No JVD, no carotid bruits, no thyromegaly, no lymphadenopathy. Lungs:Clear bilaterally, no wheezes, rhonci, crackles Cardiovascular: Regular rate and rhythm. No murmurs, gallops or rubs. Abdomen:Soft. Bowel sounds present. Non-tender.  Extremities: No lower extremity edema. Pulses are 2 + in the bilateral DP/PT.  EKG:  EKG is not ordered today. The ekg ordered today demonstrates NSR, rate 89 bpm. Non-specific ST and T wave abn, unchanged.   Recent Labs: 01/03/2015: Hemoglobin 15.0; Potassium 4.2; Sodium 139     Wt Readings from Last 3 Encounters:  04/20/15 215 lb 12.8 oz (97.886 kg)  02/14/15 218 lb (98.884 kg)  01/10/15 219 lb (99.338 kg)     Other studies Reviewed: Additional studies/ records that were reviewed today include: . Review of the above records demonstrates:   Assessment and Plan:   1. CAD: Stable. He is on good medical therapy. He is on statin, ASA and beta blocker. Exercise treadmill stress test 2015 without ischemia.   2. HTN: BP well controlled. No changes.   3. HLD: He is on a statin. Lipids  are well controlled.  Current medicines are reviewed at length with the patient today.  The patient does not have concerns regarding medicines.  The following changes have been made:  no change  Labs/ tests ordered today include:   Orders Placed This Encounter  Procedures  . EKG 12-Lead    Disposition:   FU with me in 12  months  Signed, Lauree Chandler, MD 04/20/2015 6:09 PM    [facility] [address], [place], [place]  [address] Phone: [phone]; Fax: [phone]

## 2015-04-20 ENCOUNTER — Ambulatory Visit (INDEPENDENT_AMBULATORY_CARE_PROVIDER_SITE_OTHER): Payer: BLUE CROSS/BLUE SHIELD | Admitting: Cardiovascular Disease

## 2015-04-20 ENCOUNTER — Encounter: Payer: Self-pay | Admitting: Cardiovascular Disease

## 2015-04-20 VITALS — BP 98/68 | HR 89 | Ht 70.5 in | Wt 215.8 lb

## 2015-04-20 DIAGNOSIS — I2510 Atherosclerotic heart disease of native coronary artery without angina pectoris: Secondary | ICD-10-CM

## 2015-04-20 DIAGNOSIS — E785 Hyperlipidemia, unspecified: Secondary | ICD-10-CM | POA: Diagnosis not present

## 2015-04-20 DIAGNOSIS — I10 Essential (primary) hypertension: Secondary | ICD-10-CM | POA: Diagnosis not present

## 2015-04-20 NOTE — Patient Instructions (Signed)
Medication Instructions:  Your physician recommends that you continue on your current medications as directed. Please refer to the Current Medication list given to you today.   Labwork: none  Testing/Procedures: none  Follow-Up: Your physician wants you to follow-up in:  12 months.  You will receive a reminder letter in the mail two months in advance. If you don't receive a letter, please call our office to schedule the follow-up appointment.        

## 2015-05-22 ENCOUNTER — Other Ambulatory Visit: Payer: Self-pay

## 2015-08-02 ENCOUNTER — Encounter: Payer: Self-pay | Admitting: Internal Medicine

## 2015-08-02 ENCOUNTER — Ambulatory Visit (INDEPENDENT_AMBULATORY_CARE_PROVIDER_SITE_OTHER): Payer: BLUE CROSS/BLUE SHIELD | Admitting: Internal Medicine

## 2015-08-02 VITALS — BP 124/60 | HR 91 | Temp 98.1°F | Wt 216.0 lb

## 2015-08-02 DIAGNOSIS — M159 Polyosteoarthritis, unspecified: Secondary | ICD-10-CM

## 2015-08-02 DIAGNOSIS — M150 Primary generalized (osteo)arthritis: Secondary | ICD-10-CM | POA: Diagnosis not present

## 2015-08-02 DIAGNOSIS — M25519 Pain in unspecified shoulder: Secondary | ICD-10-CM | POA: Insufficient documentation

## 2015-08-02 DIAGNOSIS — M25511 Pain in right shoulder: Secondary | ICD-10-CM

## 2015-08-02 MED ORDER — METHYLPREDNISOLONE ACETATE 40 MG/ML IJ SUSP
40.0000 mg | Freq: Once | INTRAMUSCULAR | Status: AC
  Administered 2015-08-02: 40 mg via INTRA_ARTICULAR

## 2015-08-02 MED ORDER — TRAMADOL HCL 50 MG PO TABS: 50.0000 mg | ORAL_TABLET | Freq: Four times a day (QID) | ORAL | Status: DC | PRN

## 2015-08-02 NOTE — Assessment & Plan Note (Addendum)
Poss rotator cuff strain Will inject Tramadol, Meloxicam  Potential benefits of a long term opioids use as well as potential risks (i.e. addiction risk, apnea etc) and complications (i.e. Somnolence, constipation and others) were explained to the patient and were aknowledged.

## 2015-08-02 NOTE — Progress Notes (Signed)
Subjective:  Patient ID: [name], male    DOB: [birth year]  Age: 69 y.o. MRN: [id number]  CC: Shoulder Pain   HPI [name] presents for a severe R shoulder pain x2 days - sudden onset. He in PT for LBP  Outpatient Prescriptions Prior to Visit  Medication Sig Dispense Refill  . allopurinol (ZYLOPRIM) 300 MG tablet Take 300 mg by mouth every evening.     [name] Kitchen aspirin 81 MG tablet Take 81 mg by mouth daily.    [name] Kitchen atorvastatin (LIPITOR) 80 MG tablet Take 40 mg by mouth every evening. Takes 1/2 tablet    . Cholecalciferol (VITAMIN D3) 1000 UNITS CAPS Take 1 capsule by mouth daily.     [name] Kitchen gabapentin (NEURONTIN) 300 MG capsule Take 300 mg by mouth at bedtime.    [name] Kitchen lisinopril (PRINIVIL,ZESTRIL) 40 MG tablet Take 20 mg by mouth every evening. Takes 1/2 tablet    . meloxicam (MOBIC) 15 MG tablet Take 15 mg by mouth daily.    . metoprolol tartrate (LOPRESSOR) 25 MG tablet Take 12.5 mg by mouth every evening. Takes 1/2 tablet    . Multiple Vitamin (MULTIVITAMIN WITH MINERALS) TABS Take 1 tablet by mouth daily.    . nitroGLYCERIN (NITROSTAT) 0.4 MG SL tablet Place 1 tablet (0.4 mg total) under the tongue every 5 (five) minutes as needed for chest pain. 20 tablet 5  . traMADol (ULTRAM) 50 MG tablet Take 1-2 tablets (50-100 mg total) by mouth every 6 (six) hours as needed for moderate pain or severe pain. 240 tablet 2  . vitamin B-12 (CYANOCOBALAMIN) 500 MCG tablet Take 500 mcg by mouth daily.     No facility-administered medications prior to visit.    ROS Review of Systems  Constitutional: Negative for chills and fatigue.  Respiratory: Negative for shortness of breath.   Cardiovascular: Negative for chest pain.  Musculoskeletal: Negative for arthralgias, neck pain and neck stiffness.    Objective:  BP 124/60 mmHg  Pulse 91  Temp(Src) 98.1 F (36.7 C) (Oral)  Wt 216 lb (97.977 kg)  SpO2 95%  BP Readings from Last 3 Encounters:  08/02/15 124/60  04/20/15 98/68  02/14/15  130/70    Wt Readings from Last 3 Encounters:  08/02/15 216 lb (97.977 kg)  04/20/15 215 lb 12.8 oz (97.886 kg)  02/14/15 218 lb (98.884 kg)    Physical Exam  Constitutional: He appears well-developed and well-nourished. No distress.  Cardiovascular: Exam reveals no gallop.   Pulmonary/Chest: He has no rales.  Abdominal: There is no tenderness.  Musculoskeletal: He exhibits tenderness. He exhibits no edema.  Neurological: Coordination normal.  Skin: No rash noted. He is not diaphoretic. No pallor.  R subacr is very tender   Procedure :Joint Injection,  R shoulder   Indication:  Subacromial bursitis with refractory  chronic pain.   Risks including unsuccessful procedure , bleeding, infection, bruising, skin atrophy, "steroid flare-up" and others were explained to the patient in detail as well as the benefits. Informed consent was obtained and signed.   Tthe patient was placed in a comfortable position. Lateral approach was used. Skin was prepped with Betadine and alcohol  and anesthetized with a cooling spray. Then, a 5 cc syringe with a 2 inch long 24-gauge needle was used for a joint injection.. The needle was advanced  Into the subacromial space.The bursa was injected with 3 mL of 2% lidocaine and 40 mg of Depo-Medrol .  Band-Aid was applied.   Tolerated well. Complications:  None. Good pain relief following the procedure.   Postprocedure instructions :    A Band-Aid should be left on for 12 hours. Injection therapy is not a cure itself. It is used in conjunction with other modalities. You can use nonsteroidal anti-inflammatories like ibuprofen , hot and cold compresses. Rest is recommended in the next 24 hours. You need to report immediately  if fever, chills or any signs of infection develop.    Lab Results  Component Value Date   WBC 6.7 02/10/2013   HGB 15.0 01/03/2015   HCT 44.0 01/03/2015   PLT 156 02/10/2013   GLUCOSE 106* 01/03/2015   CHOL 157 04/14/2013   TRIG  130.0 04/14/2013   HDL 57.50 04/14/2013   LDLCALC 74 04/14/2013   ALT 21 04/14/2013   AST 45* 04/14/2013   NA 139 01/03/2015   K 4.2 01/03/2015   CL 104 10/06/2013   CREATININE 1.4 10/06/2013   BUN 18 10/06/2013   CO2 30 10/06/2013   TSH 1.82 08/09/2011   INR 1.12 01/19/2010   HGBA1C 5.8 08/09/2011    No results found.  Assessment & Plan:   There are no diagnoses linked to this encounter. I am having [name] maintain his Vitamin D3, vitamin B-12, lisinopril, metoprolol tartrate, multivitamin with minerals, atorvastatin, allopurinol, nitroGLYCERIN, gabapentin, traMADol, aspirin, and meloxicam.  No orders of the defined types were placed in this encounter.     Follow-up: No Follow-up on file.  Walker Kehr, MD

## 2015-08-02 NOTE — Patient Instructions (Signed)
Postprocedure instructions :    A Band-Aid should be left on for 12 hours. Injection therapy is not a cure itself. It is used in conjunction with other modalities. You can use nonsteroidal anti-inflammatories like ibuprofen , hot and cold compresses. Rest is recommended in the next 24 hours. You need to report immediately  if fever, chills or any signs of infection develop. 

## 2015-08-02 NOTE — Progress Notes (Signed)
Pre visit review using our clinic review tool, if applicable. No additional management support is needed unless otherwise documented below in the visit note. 

## 2015-08-02 NOTE — Addendum Note (Signed)
Addended by: Cassandria Anger on: 08/02/2015 03:25 PM   Modules accepted: Orders, SmartSet

## 2015-08-02 NOTE — Assessment & Plan Note (Signed)
On Meloxicam prn

## 2015-08-04 ENCOUNTER — Ambulatory Visit (INDEPENDENT_AMBULATORY_CARE_PROVIDER_SITE_OTHER): Payer: BLUE CROSS/BLUE SHIELD | Admitting: Internal Medicine

## 2015-08-04 ENCOUNTER — Other Ambulatory Visit: Payer: BLUE CROSS/BLUE SHIELD

## 2015-08-04 ENCOUNTER — Encounter: Payer: Self-pay | Admitting: Internal Medicine

## 2015-08-04 VITALS — BP 118/80 | HR 76 | Temp 98.3°F | Resp 18 | Ht 71.0 in | Wt 211.0 lb

## 2015-08-04 DIAGNOSIS — R509 Fever, unspecified: Secondary | ICD-10-CM | POA: Diagnosis not present

## 2015-08-04 LAB — POCT URINALYSIS DIPSTICK
Bilirubin, UA: NEGATIVE
GLUCOSE UA: NEGATIVE
Ketones, UA: NEGATIVE
Nitrite, UA: NEGATIVE
PROTEIN UA: POSITIVE
Spec Grav, UA: 1.025
Urobilinogen, UA: NEGATIVE
pH, UA: 5.5

## 2015-08-04 MED ORDER — CIPROFLOXACIN HCL 500 MG PO TABS: 500.0000 mg | ORAL_TABLET | Freq: Two times a day (BID) | ORAL | Status: DC

## 2015-08-04 NOTE — Patient Instructions (Addendum)
Please stop taking the metoprolol and the lisinopril for the next 3 days. If you are feeling better after that time you can go back to taking the medicines.   You do have a urine infection and we are going to have you take ciprofloxacin 1 pill twice a day for 5 days. Call us if you are not doing better after finishing.

## 2015-08-04 NOTE — Assessment & Plan Note (Signed)
No concern for pneumonia given normal lung exam, checking urine dipstick today which is positive for leukocytes. Will treat for UTI and send for culture. Rx for ciprofloxacin 5 day course. Advised to hold all BP medications until illness cleared given low BP on exam today. If no improvement he will call back.

## 2015-08-04 NOTE — Progress Notes (Signed)
Pre visit review using our clinic review tool, if applicable. No additional management support is needed unless otherwise documented below in the visit note. 

## 2015-08-04 NOTE — Progress Notes (Signed)
   Subjective:    Patient ID: [name], male    DOB: 1946-11-22, 69 y.o.   MRN: [id number]  HPI The patient is a 69 YO man coming in for fevers and chills. Last night temp was 103F and he was having some confusion and urinary incontinence. His wife gave him some tylenol which brought down the fever, he has also been drinking cold water to help. Mild fever this morning. Had chills last night as well. Increased frequency of urination but he is not sure if that recent. Not confused this morning and feeling improved somewhat. Not normal yet. Denies sinus congestion, cold symptoms, cough, diarrhea. No other complaints. Had shoulder injection 2 days ago. No rash over the area and pain is much improved in the shoulder.   Review of Systems  Constitutional: Positive for fever, chills, activity change, appetite change and fatigue. Negative for unexpected weight change.  HENT: Negative.   Eyes: Negative.   Respiratory: Negative for cough, chest tightness, shortness of breath and wheezing.   Cardiovascular: Negative for chest pain, palpitations and leg swelling.  Gastrointestinal: Negative for nausea, abdominal pain, diarrhea, constipation and abdominal distention.  Genitourinary: Positive for urgency, frequency and enuresis. Negative for dysuria and decreased urine volume.  Musculoskeletal: Positive for myalgias, back pain and arthralgias.       Chronic  Skin: Negative.   Neurological: Negative.   Psychiatric/Behavioral: Negative.       Objective:   Physical Exam  Constitutional: He is oriented to person, place, and time. He appears well-developed and well-nourished.  In mild to moderate distress with some shaking  HENT:  Head: Normocephalic and atraumatic.  Right Ear: External ear normal.  Left Ear: External ear normal.  Eyes: EOM are normal.  Neck: Normal range of motion.  Cardiovascular: Normal rate and regular rhythm.   Pulmonary/Chest: Effort normal and breath sounds normal. No  respiratory distress. He has no wheezes. He has no rales.  Abdominal: Soft. Bowel sounds are normal. He exhibits no distension. There is no tenderness. There is no rebound.  Neurological: He is alert and oriented to person, place, and time.  Skin: Skin is warm and dry.  Psychiatric: He has a normal mood and affect.   Filed Vitals:   08/04/15 1105  BP: 118/80  Pulse: 76  Temp: 98.3 F (36.8 C)  TempSrc: Oral  Resp: 18  Height: 5\' 11"  (1.803 m)  Weight: 211 lb (95.709 kg)      Assessment & Plan:

## 2015-08-07 ENCOUNTER — Encounter: Payer: Self-pay | Admitting: Family

## 2015-08-07 ENCOUNTER — Ambulatory Visit (INDEPENDENT_AMBULATORY_CARE_PROVIDER_SITE_OTHER): Payer: BLUE CROSS/BLUE SHIELD | Admitting: Family

## 2015-08-07 VITALS — BP 118/84 | HR 94 | Temp 98.4°F | Resp 18 | Ht 71.0 in | Wt 209.8 lb

## 2015-08-07 DIAGNOSIS — R509 Fever, unspecified: Secondary | ICD-10-CM | POA: Diagnosis not present

## 2015-08-07 NOTE — Patient Instructions (Signed)
Thank you for choosing [facility].  Summary/Instructions:  Eat frequent small meals. Start with light foods and progress as tolerated to a regular diet. Please drink plenty of fluids and avoid caffeine including sports drinks as tolerated.   Finish the antibiotic as instructed.  For your headache - Tylenol (up to 3,000 mg per day) as needed or ibuprofen (600-800 mg 3 times per day) or Aleve (2 pills twice per day as needed). If those do not work please let us know.   If your symptoms worsen or fail to improve, please contact our office for further instruction, or in case of emergency go directly to the emergency room at the closest medical facility.

## 2015-08-07 NOTE — Progress Notes (Signed)
Subjective:    Patient ID: [name], male    DOB: [birth year], 69 y.o.   MRN: [id number]  Chief Complaint  Patient presents with  . Follow-up    migraine headache, dizziness, states he still has a bit of a fever, was seen friday for this issue and was told to come back if he wasn't better by today, had a low BP of 85/50 last night    HPI:  [name] is a 69 y.o. male with a PMH of hypertension, coronary artery disease, bronchitis, diverticulosis, chronic renal insufficiency, hyperlipidemia, gout, fatigue, and fever who presents today for a follow-up office visit.  This is a continuation of a previous problem. Was recently seen in the office with complaints of fevers, chills, confusion and urinary incontinence. He used Tyleonl to bring down the fever. He had received a depmedrol injection on 9/7. Urinalysis was positive for potential UTI and was treated with ciprofloxacin x 5 days. Urine culture was ordered, however no results are available for review. Reports that he is taking the medication as prescribed and denies adverse side effects.   Continues to experience the associated symptoms dizziness, fatigue, headache, and chills. Reports improvement since starting the ciprofloxacin. Notes that he has has 3 episodes of blood in his urine and has been treated by urology in the past. Describes the headaches as dull with the occasional shooting pain on occasion. Mofiying factors include Tylenol which did not help very much with the pain.   No Known Allergies  Current Outpatient Prescriptions on File Prior to Visit  Medication Sig Dispense Refill  . allopurinol (ZYLOPRIM) 300 MG tablet Take 300 mg by mouth every evening.     [name] Kitchen aspirin 81 MG tablet Take 81 mg by mouth daily.    [name] Kitchen atorvastatin (LIPITOR) 80 MG tablet Take 40 mg by mouth every evening. Takes 1/2 tablet    . Cholecalciferol (VITAMIN D3) 1000 UNITS CAPS Take 1 capsule by mouth daily.     . ciprofloxacin (CIPRO) 500 MG  tablet Take 1 tablet (500 mg total) by mouth 2 (two) times daily. 10 tablet 0  . gabapentin (NEURONTIN) 300 MG capsule Take 300 mg by mouth at bedtime.    [name] Kitchen lisinopril (PRINIVIL,ZESTRIL) 40 MG tablet Take 20 mg by mouth every evening. Takes 1/2 tablet    . meloxicam (MOBIC) 15 MG tablet Take 15 mg by mouth daily.    . metoprolol tartrate (LOPRESSOR) 25 MG tablet Take 12.5 mg by mouth every evening. Takes 1/2 tablet    . Multiple Vitamin (MULTIVITAMIN WITH MINERALS) TABS Take 1 tablet by mouth daily.    . nitroGLYCERIN (NITROSTAT) 0.4 MG SL tablet Place 1 tablet (0.4 mg total) under the tongue every 5 (five) minutes as needed for chest pain. 20 tablet 5  . traMADol (ULTRAM) 50 MG tablet Take 1-2 tablets (50-100 mg total) by mouth every 6 (six) hours as needed for moderate pain or severe pain. 240 tablet 1  . vitamin B-12 (CYANOCOBALAMIN) 500 MCG tablet Take 500 mcg by mouth daily.     No current facility-administered medications on file prior to visit.    Review of Systems  Constitutional: Negative for fever and chills.  Genitourinary: Negative for dysuria, urgency, hematuria and flank pain.       Negative for urinary incontinence  Neurological: Positive for light-headedness and headaches.      Objective:    BP 118/84 mmHg  Pulse 94  Temp(Src) 98.4 F (36.9 C) (Oral)  Resp 18  Ht 5\' 11"  (1.803 m)  Wt 209 lb 12.8 oz (95.165 kg)  BMI 29.27 kg/m2  SpO2 96% Nursing note and vital signs reviewed.  Physical Exam  Constitutional: He is oriented to person, place, and time. He appears well-developed and well-nourished. No distress.  Appears non-toxic but not feeling well. Appears his stated age and is dressed appropriate for the situation and responds to questions appropriately.   Cardiovascular: Normal rate, regular rhythm, normal heart sounds and intact distal pulses.   Pulmonary/Chest: Effort normal and breath sounds normal.  Neurological: He is alert and oriented to person, place, and  time.  Skin: Skin is warm and dry. He is not diaphoretic.  Psychiatric: He has a normal mood and affect. His behavior is normal. Judgment and thought content normal.       Assessment & Plan:   Problem List Items Addressed This Visit      Other   Fever - Primary    Fevers and urinary incontinence are improved since starting the ciprofloxacin. Vital signs are stable with only slight tachycardia noted. This is most likely secondary to decreased oral and fluid intake. Complete previously prescribed antibiotics. Drink plenty of non-caffeinated fluids. Follow up if symptoms worsen or fail to improve after completion of the antibiotic. Continue over the counter medications as needed for headache relief. Continue to hold blood pressure medication until feeling better.

## 2015-08-07 NOTE — Progress Notes (Signed)
Pre visit review using our clinic review tool, if applicable. No additional management support is needed unless otherwise documented below in the visit note. 

## 2015-08-07 NOTE — Assessment & Plan Note (Addendum)
Fevers and urinary incontinence are improved since starting the ciprofloxacin. Vital signs are stable with only slight tachycardia noted. This is most likely secondary to decreased oral and fluid intake. Complete previously prescribed antibiotics. Drink plenty of non-caffeinated fluids. Follow up if symptoms worsen or fail to improve after completion of the antibiotic. Continue over the counter medications as needed for headache relief. Continue to hold blood pressure medication until feeling better.

## 2015-09-27 DIAGNOSIS — N390 Urinary tract infection, site not specified: Secondary | ICD-10-CM | POA: Diagnosis not present

## 2015-09-27 DIAGNOSIS — C61 Malignant neoplasm of prostate: Secondary | ICD-10-CM | POA: Diagnosis not present

## 2015-10-04 DIAGNOSIS — C61 Malignant neoplasm of prostate: Secondary | ICD-10-CM | POA: Diagnosis not present

## 2015-10-04 DIAGNOSIS — R310 Gross hematuria: Secondary | ICD-10-CM | POA: Diagnosis not present

## 2015-10-04 DIAGNOSIS — N3040 Irradiation cystitis without hematuria: Secondary | ICD-10-CM | POA: Diagnosis not present

## 2015-10-09 DIAGNOSIS — M5442 Lumbago with sciatica, left side: Secondary | ICD-10-CM | POA: Diagnosis not present

## 2015-10-31 DIAGNOSIS — M5442 Lumbago with sciatica, left side: Secondary | ICD-10-CM | POA: Diagnosis not present

## 2015-10-31 DIAGNOSIS — S43109A Unspecified dislocation of unspecified acromioclavicular joint, initial encounter: Secondary | ICD-10-CM | POA: Diagnosis not present

## 2015-11-14 ENCOUNTER — Other Ambulatory Visit: Payer: Self-pay | Admitting: Surgical

## 2015-11-14 NOTE — Patient Instructions (Addendum)
YOUR PROCEDURE IS SCHEDULED ON :  11/28/14  REPORT TO [facility] MAIN ENTRANCE FOLLOW SIGNS TO EAST ELEVATOR - GO TO 3rd FLOOR CHECK IN AT 3 EAST NURSES STATION (SHORT STAY) AT:  9:30 AM  CALL THIS NUMBER IF YOU HAVE PROBLEMS THE MORNING OF SURGERY [phone]  REMEMBER:ONLY 1 PER PERSON MAY GO TO SHORT STAY WITH YOU TO GET READY THE MORNING OF YOUR SURGERY  DO NOT EAT FOOD OR DRINK LIQUIDS AFTER MIDNIGHT  TAKE THESE MEDICINES THE MORNING OF SURGERY: NONE  YOU MAY NOT HAVE ANY METAL ON YOUR BODY INCLUDING HAIR PINS AND PIERCING'S. DO NOT WEAR JEWELRY, MAKEUP, LOTIONS, POWDERS OR PERFUMES. DO NOT WEAR NAIL POLISH. DO NOT SHAVE 48 HRS PRIOR TO SURGERY. MEN MAY SHAVE FACE AND NECK.  DO NOT [place]. [facility] IS NOT RESPONSIBLE FOR VALUABLES.  CONTACTS, DENTURES OR PARTIALS MAY NOT BE WORN TO SURGERY. LEAVE SUITCASE IN CAR. CAN BE BROUGHT TO ROOM AFTER SURGERY.  PATIENTS DISCHARGED THE DAY OF SURGERY WILL NOT BE ALLOWED TO DRIVE HOME.  PLEASE READ OVER THE FOLLOWING INSTRUCTION SHEETS _________________________________________________________________________________                                          [facility] - PREPARING FOR SURGERY  Before surgery, you can play an important role.  Because skin is not sterile, your skin needs to be as free of germs as possible.  You can reduce the number of germs on your skin by washing with CHG (chlorahexidine gluconate) soap before surgery.  CHG is an antiseptic cleaner which kills germs and bonds with the skin to continue killing germs even after washing. Please DO NOT use if you have an allergy to CHG or antibacterial soaps.  If your skin becomes reddened/irritated stop using the CHG and inform your nurse when you arrive at Short Stay. Do not shave (including legs and underarms) for at least 48 hours prior to the first CHG shower.  You may shave your face. Please follow these instructions  carefully:   1.  Shower with CHG Soap the night before surgery and the  morning of Surgery.   2.  If you choose to wash your hair, wash your hair first as usual with your  normal  Shampoo.   3.  After you shampoo, rinse your hair and body thoroughly to remove the  shampoo.                                         4.  Use CHG as you would any other liquid soap.  You can apply chg directly  to the skin and wash . Gently wash with scrungie or clean wascloth    5.  Apply the CHG Soap to your body ONLY FROM THE NECK DOWN.   Do not use on open                           Wound or open sores. Avoid contact with eyes, ears mouth and genitals (private parts).                        Genitals (private parts) with your normal soap.  6.  Wash thoroughly, paying special attention to the area where your surgery  will be performed.   7.  Thoroughly rinse your body with warm water from the neck down.   8.  DO NOT shower/wash with your normal soap after using and rinsing off  the CHG Soap .                9.  Pat yourself dry with a clean towel.             10.  Wear clean night clothes to bed after shower             11.  Place clean sheets on your bed the night of your first shower and do not  sleep with pets.  Day of Surgery : Do not apply any lotions/deodorants the morning of surgery.  Please wear clean clothes to the hospital/surgery center.  FAILURE TO FOLLOW THESE INSTRUCTIONS MAY RESULT IN THE CANCELLATION OF YOUR SURGERY    PATIENT SIGNATURE_________________________________  ______________________________________________________________________     [name]  An incentive spirometer is a tool that can help keep your lungs clear and active. This tool measures how well you are filling your lungs with each breath. Taking long deep breaths may help reverse or decrease the chance of developing breathing (pulmonary) problems (especially infection) following:  A long  period of time when you are unable to move or be active. BEFORE THE PROCEDURE   If the spirometer includes an indicator to show your best effort, your nurse or respiratory therapist will set it to a desired goal.  If possible, sit up straight or lean slightly forward. Try not to slouch.  Hold the incentive spirometer in an upright position. INSTRUCTIONS FOR USE   Sit on the edge of your bed if possible, or sit up as far as you can in bed or on a chair.  Hold the incentive spirometer in an upright position.  Breathe out normally.  Place the mouthpiece in your mouth and seal your lips tightly around it.  Breathe in slowly and as deeply as possible, raising the piston or the ball toward the top of the column.  Hold your breath for 3-5 seconds or for as long as possible. Allow the piston or ball to fall to the bottom of the column.  Remove the mouthpiece from your mouth and breathe out normally.  Rest for a few seconds and repeat Steps 1 through 7 at least 10 times every 1-2 hours when you are awake. Take your time and take a few normal breaths between deep breaths.  The spirometer may include an indicator to show your best effort. Use the indicator as a goal to work toward during each repetition.  After each set of 10 deep breaths, practice coughing to be sure your lungs are clear. If you have an incision (the cut made at the time of surgery), support your incision when coughing by placing a pillow or rolled up towels firmly against it. Once you are able to get out of bed, walk around indoors and cough well. You may stop using the incentive spirometer when instructed by your caregiver.  RISKS AND COMPLICATIONS  Take your time so you do not get dizzy or light-headed.  If you are in pain, you may need to take or ask for pain medication before doing incentive spirometry. It is harder to take a deep breath if you are having pain. AFTER USE  Rest and breathe slowly and easily.  It can  be helpful to keep track of a log of your progress. Your caregiver can provide you with a simple table to help with this. If you are using the spirometer at home, follow these instructions: [name] IF:   You are having difficultly using the spirometer.  You have trouble using the spirometer as often as instructed.  Your pain medication is not giving enough relief while using the spirometer.  You develop fever of 100.5 F (38.1 C) or higher. SEEK IMMEDIATE MEDICAL CARE IF:   You cough up bloody sputum that had not been present before.  You develop fever of 102 F (38.9 C) or greater.  You develop worsening pain at or near the incision site. MAKE SURE YOU:   Understand these instructions.  Will watch your condition.  Will get help right away if you are not doing well or get worse. Document Released: 03/24/2007 Document Revised: 02/03/2012 Document Reviewed: 05/25/2007 Exit[employer] Patient Information 2014 Exit[employer], [place].   ________________________________________________________________________  WHAT IS A BLOOD TRANSFUSION? Blood Transfusion Information  A transfusion is the replacement of blood or some of its parts. Blood is made up of multiple cells which provide different functions.  Red blood cells carry oxygen and are used for blood loss replacement.  White blood cells fight against infection.  Platelets control bleeding.  Plasma helps clot blood.  Other blood products are available for specialized needs, such as hemophilia or other clotting disorders. BEFORE THE TRANSFUSION  Who gives blood for transfusions?   Healthy volunteers who are fully evaluated to make sure their blood is safe. This is blood bank blood. Transfusion therapy is the safest it has ever been in the practice of medicine. Before blood is taken from a donor, a complete history is taken to make sure that person has no history of diseases nor engages in risky social behavior (examples are  intravenous drug use or sexual activity with multiple partners). The donor's travel history is screened to minimize risk of transmitting infections, such as malaria. The donated blood is tested for signs of infectious diseases, such as HIV and hepatitis. The blood is then tested to be sure it is compatible with you in order to minimize the chance of a transfusion reaction. If you or a relative donates blood, this is often done in anticipation of surgery and is not appropriate for emergency situations. It takes many days to process the donated blood. RISKS AND COMPLICATIONS Although transfusion therapy is very safe and saves many lives, the main dangers of transfusion include:   Getting an infectious disease.  Developing a transfusion reaction. This is an allergic reaction to something in the blood you were given. Every precaution is taken to prevent this. The decision to have a blood transfusion has been considered carefully by your caregiver before blood is given. Blood is not given unless the benefits outweigh the risks. AFTER THE TRANSFUSION  Right after receiving a blood transfusion, you will usually feel much better and more energetic. This is especially true if your red blood cells have gotten low (anemic). The transfusion raises the level of the red blood cells which carry oxygen, and this usually causes an energy increase.  The nurse administering the transfusion will monitor you carefully for complications. HOME CARE INSTRUCTIONS  No special instructions are needed after a transfusion. You may find your energy is better. Speak with your caregiver about any limitations on activity for underlying diseases you may have. SEEK MEDICAL CARE IF:   Your  condition is not improving after your transfusion.  You develop redness or irritation at the intravenous (IV) site. SEEK IMMEDIATE MEDICAL CARE IF:  Any of the following symptoms occur over the next 12 hours:  Shaking chills.  You have a  temperature by mouth above 102 F (38.9 C), not controlled by medicine.  Chest, back, or muscle pain.  People around you feel you are not acting correctly or are confused.  Shortness of breath or difficulty breathing.  Dizziness and fainting.  You get a rash or develop hives.  You have a decrease in urine output.  Your urine turns a dark color or changes to pink, red, or brown. Any of the following symptoms occur over the next 10 days:  You have a temperature by mouth above 102 F (38.9 C), not controlled by medicine.  Shortness of breath.  Weakness after normal activity.  The white part of the eye turns yellow (jaundice).  You have a decrease in the amount of urine or are urinating less often.  Your urine turns a dark color or changes to pink, red, or brown. Document Released: 11/08/2000 Document Revised: 02/03/2012 Document Reviewed: 06/27/2008 [facility] Patient Information 2014 [place], [place].  _______________________________________________________________________

## 2015-11-15 ENCOUNTER — Encounter (HOSPITAL_COMMUNITY): Payer: Self-pay

## 2015-11-15 ENCOUNTER — Ambulatory Visit (HOSPITAL_COMMUNITY)
Admission: RE | Admit: 2015-11-15 | Discharge: 2015-11-15 | Disposition: A | Discharge status: Home / Self Care | Payer: Medicare Other | Source: Ambulatory Visit | Attending: Surgical | Admitting: Surgical

## 2015-11-15 ENCOUNTER — Encounter (HOSPITAL_COMMUNITY)
Admission: RE | Admit: 2015-11-15 | Discharge: 2015-11-15 | Disposition: A | Discharge status: Home / Self Care | Payer: Medicare Other | Source: Ambulatory Visit | Attending: Orthopedic Surgery | Admitting: Orthopedic Surgery

## 2015-11-15 DIAGNOSIS — M5136 Other intervertebral disc degeneration, lumbar region: Secondary | ICD-10-CM | POA: Diagnosis not present

## 2015-11-15 DIAGNOSIS — M4316 Spondylolisthesis, lumbar region: Secondary | ICD-10-CM | POA: Insufficient documentation

## 2015-11-15 DIAGNOSIS — Z01818 Encounter for other preprocedural examination: Secondary | ICD-10-CM | POA: Insufficient documentation

## 2015-11-15 DIAGNOSIS — M2578 Osteophyte, vertebrae: Secondary | ICD-10-CM | POA: Diagnosis not present

## 2015-11-15 HISTORY — DX: Nocturia: R35.1

## 2015-11-15 HISTORY — DX: Rosacea, unspecified: L71.9

## 2015-11-15 HISTORY — DX: Spinal stenosis, site unspecified: M48.00

## 2015-11-15 LAB — URINALYSIS, ROUTINE W REFLEX MICROSCOPIC
Bilirubin Urine: NEGATIVE
Glucose, UA: NEGATIVE mg/dL
Hgb urine dipstick: NEGATIVE
Ketones, ur: NEGATIVE mg/dL
Nitrite: NEGATIVE
Protein, ur: NEGATIVE mg/dL
Specific Gravity, Urine: 1.022 (ref 1.005–1.030)
pH: 5.5 (ref 5.0–8.0)

## 2015-11-15 LAB — CBC WITH DIFFERENTIAL/PLATELET
Basophils Absolute: 0.1 10*3/uL (ref 0.0–0.1)
Basophils Relative: 1 %
Eosinophils Absolute: 0.3 10*3/uL (ref 0.0–0.7)
Eosinophils Relative: 3 %
HCT: 42.8 % (ref 39.0–52.0)
Hemoglobin: 14.2 g/dL (ref 13.0–17.0)
Lymphocytes Relative: 21 %
Lymphs Abs: 1.6 10*3/uL (ref 0.7–4.0)
MCH: 31.4 pg (ref 26.0–34.0)
MCHC: 33.2 g/dL (ref 30.0–36.0)
MCV: 94.7 fL (ref 78.0–100.0)
Monocytes Absolute: 0.6 10*3/uL (ref 0.1–1.0)
Monocytes Relative: 8 %
Neutro Abs: 4.9 10*3/uL (ref 1.7–7.7)
Neutrophils Relative %: 67 %
Platelets: 180 10*3/uL (ref 150–400)
RBC: 4.52 MIL/uL (ref 4.22–5.81)
RDW: 13.9 % (ref 11.5–15.5)
WBC: 7.4 10*3/uL (ref 4.0–10.5)

## 2015-11-15 LAB — URINE MICROSCOPIC-ADD ON

## 2015-11-15 LAB — COMPREHENSIVE METABOLIC PANEL
ALT: 14 U/L — ABNORMAL LOW (ref 17–63)
AST: 29 U/L (ref 15–41)
Albumin: 4.3 g/dL (ref 3.5–5.0)
Alkaline Phosphatase: 58 U/L (ref 38–126)
Anion gap: 10 (ref 5–15)
BUN: 24 mg/dL — ABNORMAL HIGH (ref 6–20)
CO2: 28 mmol/L (ref 22–32)
Calcium: 9.6 mg/dL (ref 8.9–10.3)
Chloride: 103 mmol/L (ref 101–111)
Creatinine, Ser: 1.57 mg/dL — ABNORMAL HIGH (ref 0.61–1.24)
GFR calc Af Amer: 50 mL/min — ABNORMAL LOW (ref >60)
GFR calc non Af Amer: 43 mL/min — ABNORMAL LOW (ref >60)
Glucose, Bld: 114 mg/dL — ABNORMAL HIGH (ref 65–99)
Potassium: 4.6 mmol/L (ref 3.5–5.1)
Sodium: 141 mmol/L (ref 135–145)
Total Bilirubin: 0.9 mg/dL (ref 0.3–1.2)
Total Protein: 7.3 g/dL (ref 6.5–8.1)

## 2015-11-15 LAB — PROTIME-INR
INR: 1.01 (ref 0.00–1.49)
Prothrombin Time: 13.5 seconds (ref 11.6–15.2)

## 2015-11-15 LAB — SURGICAL PCR SCREEN
MRSA, PCR: NEGATIVE
Staphylococcus aureus: NEGATIVE

## 2015-11-15 LAB — APTT: APTT: 30 s (ref 24–37)

## 2015-11-15 NOTE — Progress Notes (Signed)
   11/15/15 1350  OBSTRUCTIVE SLEEP APNEA  Have you ever been diagnosed with sleep apnea through a sleep study? No  Do you snore loudly (loud enough to be heard through closed doors)?  1  Do you often feel tired, fatigued, or sleepy during the daytime (such as falling asleep during driving or talking to someone)? 0  Has anyone observed you stop breathing during your sleep? 0  Do you have, or are you being treated for high blood pressure? 1  BMI more than 35 kg/m2? 0  Age > 50 (1-yes) 1  Neck circumference greater than:Male 16 inches or larger, Male 17inches or larger? 1  Male Gender (Yes=1) 1  Obstructive Sleep Apnea Score 5  Score 5 or greater  Results sent to PCP

## 2015-11-16 NOTE — Progress Notes (Signed)
Abnormal CMET / UA faxed to Dr.Gioffre

## 2015-11-17 NOTE — Progress Notes (Signed)
Pt notified of surgery time change to 2:00 pm - instructed to arrive at 12:00 pm and may have clear liquids until 8:00 am

## 2015-11-29 ENCOUNTER — Ambulatory Visit (HOSPITAL_COMMUNITY): Payer: Medicare Other

## 2015-11-29 ENCOUNTER — Encounter (HOSPITAL_COMMUNITY)
Admission: RE | Disposition: A | Discharge status: Home / Self Care | Payer: Self-pay | Source: Ambulatory Visit | Attending: Orthopedic Surgery

## 2015-11-29 ENCOUNTER — Encounter (HOSPITAL_COMMUNITY): Payer: Self-pay | Admitting: *Deleted

## 2015-11-29 ENCOUNTER — Ambulatory Visit (HOSPITAL_COMMUNITY): Payer: Medicare Other | Admitting: Certified Registered Nurse Anesthetist

## 2015-11-29 ENCOUNTER — Observation Stay (HOSPITAL_COMMUNITY)
Admission: RE | Admit: 2015-11-29 | Discharge: 2015-11-30 | Disposition: A | Discharge status: Home / Self Care | Payer: Medicare Other | Source: Ambulatory Visit | Attending: Orthopedic Surgery | Admitting: Orthopedic Surgery

## 2015-11-29 DIAGNOSIS — M159 Polyosteoarthritis, unspecified: Secondary | ICD-10-CM | POA: Insufficient documentation

## 2015-11-29 DIAGNOSIS — Z923 Personal history of irradiation: Secondary | ICD-10-CM | POA: Insufficient documentation

## 2015-11-29 DIAGNOSIS — I10 Essential (primary) hypertension: Secondary | ICD-10-CM | POA: Diagnosis not present

## 2015-11-29 DIAGNOSIS — N5201 Erectile dysfunction due to arterial insufficiency: Secondary | ICD-10-CM | POA: Diagnosis not present

## 2015-11-29 DIAGNOSIS — Z8546 Personal history of malignant neoplasm of prostate: Secondary | ICD-10-CM | POA: Insufficient documentation

## 2015-11-29 DIAGNOSIS — Z87442 Personal history of urinary calculi: Secondary | ICD-10-CM | POA: Diagnosis not present

## 2015-11-29 DIAGNOSIS — M1990 Unspecified osteoarthritis, unspecified site: Secondary | ICD-10-CM | POA: Insufficient documentation

## 2015-11-29 DIAGNOSIS — I252 Old myocardial infarction: Secondary | ICD-10-CM | POA: Diagnosis not present

## 2015-11-29 DIAGNOSIS — M21372 Foot drop, left foot: Secondary | ICD-10-CM | POA: Diagnosis not present

## 2015-11-29 DIAGNOSIS — Z87440 Personal history of urinary (tract) infections: Secondary | ICD-10-CM | POA: Diagnosis not present

## 2015-11-29 DIAGNOSIS — M5126 Other intervertebral disc displacement, lumbar region: Secondary | ICD-10-CM | POA: Diagnosis not present

## 2015-11-29 DIAGNOSIS — Z9889 Other specified postprocedural states: Secondary | ICD-10-CM | POA: Diagnosis not present

## 2015-11-29 DIAGNOSIS — M7130 Other bursal cyst, unspecified site: Secondary | ICD-10-CM | POA: Diagnosis not present

## 2015-11-29 DIAGNOSIS — M4806 Spinal stenosis, lumbar region: Secondary | ICD-10-CM | POA: Insufficient documentation

## 2015-11-29 DIAGNOSIS — M109 Gout, unspecified: Secondary | ICD-10-CM | POA: Diagnosis not present

## 2015-11-29 DIAGNOSIS — M7138 Other bursal cyst, other site: Principal | ICD-10-CM | POA: Insufficient documentation

## 2015-11-29 DIAGNOSIS — Z575 Occupational exposure to toxic agents in other industries: Secondary | ICD-10-CM | POA: Insufficient documentation

## 2015-11-29 DIAGNOSIS — Z79899 Other long term (current) drug therapy: Secondary | ICD-10-CM | POA: Insufficient documentation

## 2015-11-29 DIAGNOSIS — Z955 Presence of coronary angioplasty implant and graft: Secondary | ICD-10-CM | POA: Insufficient documentation

## 2015-11-29 DIAGNOSIS — E785 Hyperlipidemia, unspecified: Secondary | ICD-10-CM | POA: Diagnosis not present

## 2015-11-29 DIAGNOSIS — Z8551 Personal history of malignant neoplasm of bladder: Secondary | ICD-10-CM | POA: Diagnosis not present

## 2015-11-29 DIAGNOSIS — Z87891 Personal history of nicotine dependence: Secondary | ICD-10-CM | POA: Insufficient documentation

## 2015-11-29 DIAGNOSIS — I2510 Atherosclerotic heart disease of native coronary artery without angina pectoris: Secondary | ICD-10-CM | POA: Diagnosis not present

## 2015-11-29 DIAGNOSIS — M48062 Spinal stenosis, lumbar region with neurogenic claudication: Secondary | ICD-10-CM | POA: Diagnosis present

## 2015-11-29 DIAGNOSIS — M21371 Foot drop, right foot: Secondary | ICD-10-CM | POA: Diagnosis not present

## 2015-11-29 DIAGNOSIS — Z7982 Long term (current) use of aspirin: Secondary | ICD-10-CM | POA: Diagnosis not present

## 2015-11-29 DIAGNOSIS — Z419 Encounter for procedure for purposes other than remedying health state, unspecified: Secondary | ICD-10-CM

## 2015-11-29 HISTORY — PX: LUMBAR LAMINECTOMY/DECOMPRESSION MICRODISCECTOMY: SHX5026

## 2015-11-29 SURGERY — LUMBAR LAMINECTOMY/DECOMPRESSION MICRODISCECTOMY 1 LEVEL
Anesthesia: General | Site: Back | Laterality: Right

## 2015-11-29 MED ORDER — CEFAZOLIN SODIUM-DEXTROSE 2-3 GM-% IV SOLR
2.0000 g | INTRAVENOUS | Status: AC
  Administered 2015-11-29: 2 g via INTRAVENOUS

## 2015-11-29 MED ORDER — FENTANYL CITRATE (PF) 250 MCG/5ML IJ SOLN
INTRAMUSCULAR | Status: AC
  Filled 2015-11-29: qty 5

## 2015-11-29 MED ORDER — BACITRACIN-NEOMYCIN-POLYMYXIN 400-5-5000 EX OINT
TOPICAL_OINTMENT | CUTANEOUS | Status: AC
  Filled 2015-11-29: qty 1

## 2015-11-29 MED ORDER — PROPOFOL 10 MG/ML IV BOLUS
INTRAVENOUS | Status: DC | PRN
  Administered 2015-11-29: 150 mg via INTRAVENOUS
  Administered 2015-11-29: 50 mg via INTRAVENOUS

## 2015-11-29 MED ORDER — LACTATED RINGERS IV SOLN
INTRAVENOUS | Status: DC
  Administered 2015-11-30: 01:00:00 via INTRAVENOUS

## 2015-11-29 MED ORDER — SODIUM CHLORIDE 0.9 % IR SOLN
Status: AC
  Filled 2015-11-29: qty 1

## 2015-11-29 MED ORDER — BUPIVACAINE LIPOSOME 1.3 % IJ SUSP
20.0000 mL | Freq: Once | INTRAMUSCULAR | Status: AC
  Administered 2015-11-29: 20 mL
  Filled 2015-11-29: qty 20

## 2015-11-29 MED ORDER — ROCURONIUM BROMIDE 100 MG/10ML IV SOLN
INTRAVENOUS | Status: AC
  Filled 2015-11-29: qty 1

## 2015-11-29 MED ORDER — LACTATED RINGERS IV SOLN
INTRAVENOUS | Status: DC | PRN
  Administered 2015-11-29: 13:00:00 via INTRAVENOUS

## 2015-11-29 MED ORDER — SODIUM CHLORIDE 0.9 % IR SOLN
Status: DC | PRN
  Administered 2015-11-29: 500 mL

## 2015-11-29 MED ORDER — PROPOFOL 10 MG/ML IV BOLUS
INTRAVENOUS | Status: AC
  Filled 2015-11-29: qty 20

## 2015-11-29 MED ORDER — LIDOCAINE HCL (CARDIAC) 20 MG/ML IV SOLN
INTRAVENOUS | Status: DC | PRN
  Administered 2015-11-29: 100 mg via INTRAVENOUS

## 2015-11-29 MED ORDER — METOPROLOL TARTRATE 12.5 MG HALF TABLET
12.5000 mg | ORAL_TABLET | Freq: Every evening | ORAL | Status: DC
  Administered 2015-11-29: 12.5 mg via ORAL
  Filled 2015-11-29 (×2): qty 1

## 2015-11-29 MED ORDER — BISACODYL 5 MG PO TBEC: 5.0000 mg | DELAYED_RELEASE_TABLET | Freq: Every day | ORAL | Status: DC | PRN

## 2015-11-29 MED ORDER — ROCURONIUM BROMIDE 100 MG/10ML IV SOLN
INTRAVENOUS | Status: DC | PRN
  Administered 2015-11-29: 40 mg via INTRAVENOUS
  Administered 2015-11-29: 10 mg via INTRAVENOUS
  Administered 2015-11-29: 20 mg via INTRAVENOUS
  Administered 2015-11-29: 10 mg via INTRAVENOUS

## 2015-11-29 MED ORDER — THROMBIN 5000 UNITS EX SOLR
CUTANEOUS | Status: AC
  Filled 2015-11-29: qty 10000

## 2015-11-29 MED ORDER — LISINOPRIL 20 MG PO TABS
20.0000 mg | ORAL_TABLET | Freq: Every evening | ORAL | Status: DC
  Administered 2015-11-29: 20 mg via ORAL
  Filled 2015-11-29 (×2): qty 1

## 2015-11-29 MED ORDER — PHENOL 1.4 % MT LIQD: 1.0000 | OROMUCOSAL | Status: DC | PRN

## 2015-11-29 MED ORDER — PROMETHAZINE HCL 25 MG/ML IJ SOLN: 6.2500 mg | INTRAMUSCULAR | Status: DC | PRN

## 2015-11-29 MED ORDER — NEOSTIGMINE METHYLSULFATE 10 MG/10ML IV SOLN
INTRAVENOUS | Status: DC | PRN
  Administered 2015-11-29: 5 mg via INTRAVENOUS

## 2015-11-29 MED ORDER — DEXAMETHASONE SODIUM PHOSPHATE 10 MG/ML IJ SOLN
INTRAMUSCULAR | Status: DC | PRN
  Administered 2015-11-29: 10 mg via INTRAVENOUS

## 2015-11-29 MED ORDER — CEFAZOLIN SODIUM-DEXTROSE 2-3 GM-% IV SOLR
INTRAVENOUS | Status: AC
  Filled 2015-11-29: qty 50

## 2015-11-29 MED ORDER — HYDROMORPHONE HCL 1 MG/ML IJ SOLN
INTRAMUSCULAR | Status: AC
  Filled 2015-11-29: qty 1

## 2015-11-29 MED ORDER — HYDROMORPHONE HCL 1 MG/ML IJ SOLN: 0.5000 mg | INTRAMUSCULAR | Status: DC | PRN

## 2015-11-29 MED ORDER — OXYCODONE-ACETAMINOPHEN 5-325 MG PO TABS
1.0000 | ORAL_TABLET | ORAL | Status: DC | PRN
  Administered 2015-11-29 (×2): 1 via ORAL
  Filled 2015-11-29: qty 2
  Filled 2015-11-29: qty 1

## 2015-11-29 MED ORDER — PHENYLEPHRINE HCL 10 MG/ML IJ SOLN
10.0000 mg | INTRAVENOUS | Status: DC | PRN
  Administered 2015-11-29: 40 ug/min via INTRAVENOUS

## 2015-11-29 MED ORDER — LACTATED RINGERS IV SOLN
INTRAVENOUS | Status: DC
  Administered 2015-11-29: 16:00:00 via INTRAVENOUS

## 2015-11-29 MED ORDER — CHLORHEXIDINE GLUCONATE 4 % EX LIQD: 60.0000 mL | Freq: Once | CUTANEOUS | Status: DC

## 2015-11-29 MED ORDER — METHOCARBAMOL 1000 MG/10ML IJ SOLN
500.0000 mg | Freq: Four times a day (QID) | INTRAVENOUS | Status: DC | PRN
  Administered 2015-11-29: 500 mg via INTRAVENOUS
  Filled 2015-11-29 (×2): qty 5

## 2015-11-29 MED ORDER — FLEET ENEMA 7-19 GM/118ML RE ENEM: 1.0000 | ENEMA | Freq: Once | RECTAL | Status: DC | PRN

## 2015-11-29 MED ORDER — SUCCINYLCHOLINE CHLORIDE 20 MG/ML IJ SOLN
INTRAMUSCULAR | Status: DC | PRN
  Administered 2015-11-29: 100 mg via INTRAVENOUS

## 2015-11-29 MED ORDER — BUPIVACAINE-EPINEPHRINE 0.5% -1:200000 IJ SOLN
INTRAMUSCULAR | Status: DC | PRN
  Administered 2015-11-29: 20 mL

## 2015-11-29 MED ORDER — BUPIVACAINE-EPINEPHRINE (PF) 0.5% -1:200000 IJ SOLN
INTRAMUSCULAR | Status: AC
  Filled 2015-11-29: qty 30

## 2015-11-29 MED ORDER — NITROGLYCERIN 0.4 MG SL SUBL: 0.4000 mg | SUBLINGUAL_TABLET | SUBLINGUAL | Status: DC | PRN

## 2015-11-29 MED ORDER — GABAPENTIN 300 MG PO CAPS
300.0000 mg | ORAL_CAPSULE | Freq: Every day | ORAL | Status: DC
  Administered 2015-11-29: 300 mg via ORAL
  Filled 2015-11-29 (×2): qty 1

## 2015-11-29 MED ORDER — ONDANSETRON HCL 4 MG/2ML IJ SOLN
INTRAMUSCULAR | Status: DC | PRN
  Administered 2015-11-29: 4 mg via INTRAVENOUS

## 2015-11-29 MED ORDER — ONDANSETRON HCL 4 MG/2ML IJ SOLN: 4.0000 mg | INTRAMUSCULAR | Status: DC | PRN

## 2015-11-29 MED ORDER — ATORVASTATIN CALCIUM 40 MG PO TABS
40.0000 mg | ORAL_TABLET | Freq: Every evening | ORAL | Status: DC
  Administered 2015-11-29: 40 mg via ORAL
  Filled 2015-11-29 (×2): qty 1

## 2015-11-29 MED ORDER — ACETAMINOPHEN 650 MG RE SUPP: 650.0000 mg | RECTAL | Status: DC | PRN

## 2015-11-29 MED ORDER — LACTATED RINGERS IV SOLN: INTRAVENOUS | Status: DC

## 2015-11-29 MED ORDER — BACITRACIN-NEOMYCIN-POLYMYXIN 400-5-5000 EX OINT
TOPICAL_OINTMENT | CUTANEOUS | Status: DC | PRN
  Administered 2015-11-29: 1 via TOPICAL

## 2015-11-29 MED ORDER — LIDOCAINE HCL (CARDIAC) 20 MG/ML IV SOLN
INTRAVENOUS | Status: AC
  Filled 2015-11-29: qty 5

## 2015-11-29 MED ORDER — ONDANSETRON HCL 4 MG/2ML IJ SOLN
INTRAMUSCULAR | Status: AC
  Filled 2015-11-29: qty 2

## 2015-11-29 MED ORDER — ALLOPURINOL 300 MG PO TABS
300.0000 mg | ORAL_TABLET | Freq: Every evening | ORAL | Status: DC
Start: 2015-11-29 — End: 2015-11-30
  Administered 2015-11-29: 300 mg via ORAL
  Filled 2015-11-29 (×2): qty 1

## 2015-11-29 MED ORDER — METHOCARBAMOL 500 MG PO TABS
500.0000 mg | ORAL_TABLET | Freq: Four times a day (QID) | ORAL | Status: DC | PRN
  Administered 2015-11-29: 500 mg via ORAL
  Filled 2015-11-29: qty 1

## 2015-11-29 MED ORDER — ACETAMINOPHEN 325 MG PO TABS
650.0000 mg | ORAL_TABLET | ORAL | Status: DC | PRN
  Administered 2015-11-30 (×2): 650 mg via ORAL
  Filled 2015-11-29 (×2): qty 2

## 2015-11-29 MED ORDER — THROMBIN 5000 UNITS EX SOLR
OROMUCOSAL | Status: DC | PRN
  Administered 2015-11-29: 2 mL via TOPICAL

## 2015-11-29 MED ORDER — FENTANYL CITRATE (PF) 100 MCG/2ML IJ SOLN
INTRAMUSCULAR | Status: DC | PRN
  Administered 2015-11-29 (×5): 50 ug via INTRAVENOUS

## 2015-11-29 MED ORDER — POLYETHYLENE GLYCOL 3350 17 G PO PACK: 17.0000 g | PACK | Freq: Every day | ORAL | Status: DC | PRN

## 2015-11-29 MED ORDER — HYDROMORPHONE HCL 1 MG/ML IJ SOLN
0.2500 mg | INTRAMUSCULAR | Status: DC | PRN
Start: 2015-11-29 — End: 2015-11-29
  Administered 2015-11-29 (×4): 0.5 mg via INTRAVENOUS

## 2015-11-29 MED ORDER — GLYCOPYRROLATE 0.2 MG/ML IJ SOLN
INTRAMUSCULAR | Status: DC | PRN
  Administered 2015-11-29: .8 mg via INTRAVENOUS

## 2015-11-29 MED ORDER — MENTHOL 3 MG MT LOZG: 1.0000 | LOZENGE | OROMUCOSAL | Status: DC | PRN

## 2015-11-29 MED ORDER — CEFAZOLIN SODIUM 1-5 GM-% IV SOLN
1.0000 g | Freq: Three times a day (TID) | INTRAVENOUS | Status: AC
  Administered 2015-11-29 – 2015-11-30 (×3): 1 g via INTRAVENOUS
  Filled 2015-11-29 (×3): qty 50

## 2015-11-29 MED ORDER — PHENYLEPHRINE HCL 10 MG/ML IJ SOLN
INTRAMUSCULAR | Status: DC | PRN
  Administered 2015-11-29 (×2): 80 ug via INTRAVENOUS

## 2015-11-29 SURGICAL SUPPLY — 39 items
BAG SPEC THK2 15X12 ZIP CLS (MISCELLANEOUS)
BAG ZIPLOCK 12X15 (MISCELLANEOUS) IMPLANT
CLEANER TIP ELECTROSURG 2X2 (MISCELLANEOUS) ×3 IMPLANT
DRAPE MICROSCOPE LEICA (MISCELLANEOUS) ×3 IMPLANT
DRAPE POUCH INSTRU U-SHP 10X18 (DRAPES) ×3 IMPLANT
DRAPE SHEET LG 3/4 BI-LAMINATE (DRAPES) ×3 IMPLANT
DRAPE SURG 17X11 SM STRL (DRAPES) ×3 IMPLANT
DRSG ADAPTIC 3X8 NADH LF (GAUZE/BANDAGES/DRESSINGS) ×3 IMPLANT
DRSG PAD ABDOMINAL 8X10 ST (GAUZE/BANDAGES/DRESSINGS) ×6 IMPLANT
DURAPREP 26ML APPLICATOR (WOUND CARE) ×3 IMPLANT
ELECT BLADE TIP CTD 4 INCH (ELECTRODE) ×3 IMPLANT
ELECT REM PT RETURN 9FT ADLT (ELECTROSURGICAL) ×3
ELECTRODE REM PT RTRN 9FT ADLT (ELECTROSURGICAL) ×1 IMPLANT
GAUZE SPONGE 4X4 12PLY STRL (GAUZE/BANDAGES/DRESSINGS) ×3 IMPLANT
GLOVE BIOGEL PI IND STRL 8 (GLOVE) ×1 IMPLANT
GLOVE BIOGEL PI INDICATOR 8 (GLOVE) ×2
GLOVE ECLIPSE 8.0 STRL XLNG CF (GLOVE) ×3 IMPLANT
GOWN STRL REUS W/TWL XL LVL3 (GOWN DISPOSABLE) ×6 IMPLANT
KIT BASIN OR (CUSTOM PROCEDURE TRAY) ×3 IMPLANT
KIT POSITIONING SURG ANDREWS (MISCELLANEOUS) IMPLANT
MANIFOLD NEPTUNE II (INSTRUMENTS) ×3 IMPLANT
MARKER SKIN DUAL TIP RULER LAB (MISCELLANEOUS) ×3 IMPLANT
NDL SPNL 18GX3.5 QUINCKE PK (NEEDLE) ×2 IMPLANT
NEEDLE HYPO 22GX1.5 SAFETY (NEEDLE) ×3 IMPLANT
NEEDLE SPNL 18GX3.5 QUINCKE PK (NEEDLE) ×6 IMPLANT
PACK LAMINECTOMY ORTHO (CUSTOM PROCEDURE TRAY) ×3 IMPLANT
PAD ABD 8X10 STRL (GAUZE/BANDAGES/DRESSINGS) ×6 IMPLANT
PATTIES SURGICAL .5 X.5 (GAUZE/BANDAGES/DRESSINGS) IMPLANT
PATTIES SURGICAL .75X.75 (GAUZE/BANDAGES/DRESSINGS) ×3 IMPLANT
PATTIES SURGICAL 1X1 (DISPOSABLE) ×3 IMPLANT
SPONGE LAP 4X18 X RAY DECT (DISPOSABLE) ×6 IMPLANT
SPONGE SURGIFOAM ABS GEL 100 (HEMOSTASIS) ×3 IMPLANT
STAPLER VISISTAT 35W (STAPLE) ×3 IMPLANT
SUT VIC AB 0 CT1 27 (SUTURE) ×3
SUT VIC AB 0 CT1 27XBRD ANTBC (SUTURE) ×1 IMPLANT
SUT VIC AB 1 CT1 27 (SUTURE) ×9
SUT VIC AB 1 CT1 27XBRD ANTBC (SUTURE) ×3 IMPLANT
SYR 20CC LL (SYRINGE) ×6 IMPLANT
TOWEL OR 17X26 10 PK STRL BLUE (TOWEL DISPOSABLE) ×3 IMPLANT

## 2015-11-29 NOTE — Op Note (Signed)
NAME:  [name], [name] NO.:  [id number]  MEDICAL RECORD NO.:  [id number]  LOCATION:  [facility]                         FACILITY:  [facility]  PHYSICIAN:  Kipp Brood. Mesiah Manzo, M.D.DATE OF BIRTH:  [birth year]  DATE OF PROCEDURE:  11/29/2015 DATE OF DISCHARGE:                              OPERATIVE REPORT   SURGEON:  Kipp Brood. Gladstone Lighter, M.D.  ASSISTANT:  Tarri Glenn, M.D.  PREOPERATIVE DIAGNOSES: 1. Spinal stenosis at L4-5. 2. Recurrent herniated disk at L4-5 on the left. 3. Synovial cyst at L4-5 on the left. 4. Partial footdrop on the left. 5. He was postop approximately 6 years ago hemilaminectomy by another     surgeon at L4-5 on the opposite right thigh.  POSTOPERATIVE DIAGNOSES: 1. Spinal stenosis at L4-5. 2. Recurrent herniated disk at L4-5 on the left. 3. Synovial cyst at L4-5 on the left. 4. Partial footdrop on the left. 5. He was postop approximately 6 years ago hemilaminectomy by another     surgeon at L4-5 on the opposite right thigh.  OPERATION: 1. Central decompressive lumbar laminectomy at L4-5 for spinal     stenosis. 2. Microdiskectomy at L4-5 on the left for recurrent herniated disk at     L4-5. 3. Excision of a synovial cyst at L4-5 on the left. 4. Partial facetectomy at L4-5 on the left.  DESCRIPTION OF PROCEDURE:  Under general anesthesia, routine orthopedic prep and draping of the back was carried out.  The appropriate time-out was first carried out.  I also marked the appropriate left side in the holding area.  At this time, 2 needles were placed in the back for localization purposes.  X-ray was taken.  We identified the spinous process of L4.  An incision was made proximal and distal to that.  We then stripped the muscle from the lamina and spinous process of L4, L5, and went distally and proximally.  A Kocher clamp then was placed on the spinous process of L4.  Another x-ray was taken to verify the position. We then went down and started  our central decompressive lumbar laminectomy, another x-ray was taken to verify the position.  Note, there was a significant amount of scar tissue where he had 2 previous operation.  The last one we had recorded was 6 years ago by another Psychologist, sport and exercise.  He had 1 several years before that and does not recall what was done.  Anyway, we went down, we carefully dissected the soft tissue away from the dura because it was markedly scarred down.  We went proximal and distally on laminectomy.  We identified a large synovial cyst at 4-5 on the left.  We excised that.  We then did a partial facetectomy to clear out the lateral recess.  We then identified several loose disk fragments in the lateral recess.  We removed those, identified the L5 nerve root.  We were able to easily pass a hockey- stick out the foramen after we did a foraminotomy.  There were no other fragments noted there.  We thoroughly irrigated out the area.  We sent the disk down to the lab.  We then loosely applied some thrombin-soaked Gelfoam.  There were no dural leaks.  He did have some leakage of fluid when obviously removed the synovial cyst.  The wound then was closed in layers in usual fashion.  I left a small deep in distal and proximal portal wound open for drainage purposes.  I initially injected 20 mL of 0.5% Marcaine and epinephrine to control bleeding at the beginning.  At the end, I injected 20 mL of Exparel into the soft tissue.  Wound was closed in usual fashion.  Skin was closed with metal staples and a sterile Neosporin dressing was applied, prior to surgery a 2 g of IV Ancef.          ______________________________ Kipp Brood. Gladstone Lighter, M.D.     RAG/MEDQ  D:  11/29/2015  T:  11/29/2015  Job:  [id number]

## 2015-11-29 NOTE — Anesthesia Postprocedure Evaluation (Signed)
Anesthesia Post Note  Patient: [name]  Procedure(s) Performed: Procedure(s) (LRB):  DECOMPRESSION LUMBAR LAMINECTOMY L4-L5 MICRODISCECTOMY L4-L5 LEFT    EXCISION SENOVAL CYST LUMBAR 4-5 RIGHT  PARICAL FACETECTOMY LUMBAR 4-5 RIGHT  (Right)  Patient location during evaluation: PACU Anesthesia Type: General Level of consciousness: awake and alert Pain management: pain level controlled Vital Signs Assessment: post-procedure vital signs reviewed and stable Respiratory status: spontaneous breathing, nonlabored ventilation, respiratory function stable and patient connected to nasal cannula oxygen Cardiovascular status: blood pressure returned to baseline and stable Postop Assessment: no signs of nausea or vomiting Anesthetic complications: no    Last Vitals:  Filed Vitals:   11/29/15 1700 11/29/15 1710  BP: 154/83 133/78  Pulse: 94 93  Temp: 36.9 C 36.6 C  Resp: 17     Last Pain:  Filed Vitals:   11/29/15 1713  PainSc: 3                  Tiare Rohlman J

## 2015-11-29 NOTE — Brief Op Note (Signed)
11/29/2015  3:56 PM  PATIENT:  [name]  70 y.o. male  PRE-OPERATIVE DIAGNOSIS:  Same as Post-Op  POST-OPERATIVE DIAGNOSIS:  1. SyNOVIAL CYST LUMBAR 4-5 Left  2. SPINAL STENOSIS LUMBAR 4-5 3. RECURRENT HERNIATED DISC LUMBAR 4-5 Left 4. PARTIAL FOOT-DROPLeft   PROCEDURE:  Decompressive Lumbar Laminectomy ar L-4-L-5 for Spinal Stenosis.Microdiscectomy at L-4-L-5 on the Left for RECURRENT HNP.Excision of Synovial Cyst at L-4-L-5 on the Left.Partial Facetectomy L-4-L-5 on the Left.  SURGEON:  Surgeon(s) and Role:    * Latanya Maudlin, MD - Primary    * Magnus Sinning, MD - Assisting     ASSISTANTS: Tarri Glenn MD   ANESTHESIA:   general  EBL:  Total I/O In: 1000 [I.V.:1000] Out: 300 [Urine:200; Blood:100]  BLOOD ADMINISTERED:none  DRAINS: none   LOCAL MEDICATIONS USED:  MARCAINE 20cc of 0.50% at start of the case and 20cc of Exparel at the end of the case.    SPECIMEN:  Source of Specimen:  L-4-L-5 Left  DISPOSITION OF SPECIMEN:  PATHOLOGY  COUNTS:  YES  TOURNIQUET:  * No tourniquets in log *  DICTATION: .Other Dictation: Dictation Number [id number]  PLAN OF CARE: Admit for overnight observation  PATIENT DISPOSITION:  Stable in OR   Delay start of Pharmacological VTE agent (>24hrs) due to surgical blood loss or risk of bleeding: yes

## 2015-11-29 NOTE — Anesthesia Preprocedure Evaluation (Addendum)
Anesthesia Evaluation  Patient identified by MRN, date of birth, ID band Patient awake    Reviewed: Allergy & Precautions, NPO status , Patient's Chart, lab work & pertinent test results  History of Anesthesia Complications (+) PONV and history of anesthetic complications  Airway Mallampati: II  TM Distance: >3 FB Neck ROM: Full    Dental no notable dental hx.    Pulmonary neg pulmonary ROS, former smoker,    Pulmonary exam normal breath sounds clear to auscultation       Cardiovascular Exercise Tolerance: Good hypertension, Pt. on medications and Pt. on home beta blockers + CAD  Normal cardiovascular exam Rhythm:Regular Rate:Normal  ECHO 01-21-10: Study Conclusions Left ventricle: The cavity size was normal. Wall thickness was increased in a pattern of mild LVH. Systolic function was normal. The estimated ejection fraction was in the range of 60% to 65%.    Neuro/Psych  Neuromuscular disease negative psych ROS   GI/Hepatic negative GI ROS, Neg liver ROS,   Endo/Other  negative endocrine ROS  Renal/GU Renal InsufficiencyRenal diseaseCr 1.57 K 4.6  negative genitourinary   Musculoskeletal  (+) Arthritis ,   Abdominal   Peds negative pediatric ROS (+)  Hematology negative hematology ROS (+)   Anesthesia Other Findings   Reproductive/Obstetrics negative OB ROS                          Anesthesia Physical Anesthesia Plan  ASA: III  Anesthesia Plan: General   Post-op Pain Management:    Induction: Intravenous  Airway Management Planned: Oral ETT  Additional Equipment:   Intra-op Plan:   Post-operative Plan: Extubation in OR  Informed Consent: I have reviewed the patients History and Physical, chart, labs and discussed the procedure including the risks, benefits and alternatives for the proposed anesthesia with the patient or authorized representative who has indicated  his/her understanding and acceptance.   Dental advisory given  Plan Discussed with: CRNA  Anesthesia Plan Comments:         Anesthesia Quick Evaluation

## 2015-11-29 NOTE — Interval H&P Note (Signed)
History and Physical Interval Note:  11/29/2015 1:32 PM  [name]  has presented today for surgery, with the diagnosis of SPINAL STENOSIS   The various methods of treatment have been discussed with the patient and family. After consideration of risks, benefits and other options for treatment, the patient has consented to  Procedure(s): CENTRAL DECOMPRESSION L4-L5 MICRODISCECTOMY L4-L5 LEFT   (1 LEVEL) (Left) as a surgical intervention .  The patient's history has been reviewed, patient examined, no change in status, stable for surgery.  I have reviewed the patient's chart and labs.  Questions were answered to the patient's satisfaction.     Kalsey Lull A

## 2015-11-29 NOTE — Transfer of Care (Signed)
Immediate Anesthesia Transfer of Care Note  Patient: [name]  Procedure(s) Performed: Procedure(s):  DECOMPRESSION LUMBAR LAMINECTOMY L4-L5 MICRODISCECTOMY L4-L5 LEFT    EXCISION SENOVAL CYST LUMBAR 4-5 RIGHT  PARICAL FACETECTOMY LUMBAR 4-5 RIGHT  (Right)  Patient Location: PACU  Anesthesia Type:General  Level of Consciousness:  sedated, patient cooperative and responds to stimulation  Airway & Oxygen Therapy:Patient Spontanous Breathing and Patient connected to face mask oxgen  Post-op Assessment:  Report given to PACU RN and Post -op Vital signs reviewed and stable  Post vital signs:  Reviewed and stable  Last Vitals:  Filed Vitals:   11/29/15 1206  BP: 140/84  Pulse: 89  Temp: 36.4 C  Resp: 18    Complications: No apparent anesthesia complications

## 2015-11-29 NOTE — Anesthesia Procedure Notes (Signed)
Procedure Name: Intubation Date/Time: 11/29/2015 1:44 PM Performed by: Montel Clock Pre-anesthesia Checklist: Patient identified, Emergency Drugs available, Suction available, Patient being monitored and Timeout performed Patient Re-evaluated:Patient Re-evaluated prior to inductionOxygen Delivery Method: Circle system utilized Preoxygenation: Pre-oxygenation with 100% oxygen Intubation Type: IV induction Ventilation: Mask ventilation without difficulty Laryngoscope Size: Mac and 3 Grade View: Grade I Tube type: Oral Tube size: 7.5 mm Number of attempts: 1 Airway Equipment and Method: Stylet Placement Confirmation: ETT inserted through vocal cords under direct vision,  positive ETCO2 and breath sounds checked- equal and bilateral Secured at: 23 cm Tube secured with: Tape Dental Injury: Teeth and Oropharynx as per pre-operative assessment

## 2015-11-29 NOTE — H&P (Signed)
[name] is an 70 y.o. male.   Chief Complaint: Left Leg pain HPI: He developed left leg pain and weakness of his Left Foot. He had a Hemilaminectomy at L-4-L-5 on the Opposite Right Side several years ago,by another Psychologist, sport and exercise.Central Decompressive Lumbar Laminectomy and Microdiscectomy on the Left   Past Medical History  Diagnosis Date  . GOUT 06/14/2008  . S/P radiation therapy 10/02/11 - 11/25/11    Central Lower Pelvis: [id number] cGy/38 Fractions--  for recurrent prostate cancer  . PONV (postoperative nausea and vomiting)   . History of myocardial infarction     04-29-2003  -- posterior and lateral wall MI  s/p  stent x2 to  left mid CFX  . Hypertension   . S/P coronary artery stent placement     BMS x1  and DES x1  to mid left CFX  04-29-2003  . Hyperlipidemia   . H/O agent Orange exposure   . Diverticulosis of colon   . Nephrolithiasis     bilateral  . History of kidney stones   . Lesion of bladder   . Organic impotence     arterial insuff.  . SUI (stress urinary incontinence), male   . CORONARY ARTERY DISEASE CARDIOLOGIST--  DR WALL AND DR MCALHANY    ETT  05-06-2014,  no ischemia or st changes;  low risk nuclear study 04-23-2011--- a. s/p post wall MI  2004 => overlapping stents in the CFX (one Zeta stent and one Taxus DES);  b. [place] => LHC 2/11: Normal LM, normal LAD, normal Dx, CFX 80% ISR, PDA 30-40%, EF 55%. PCI: Cutting balloon angioplasty to the CFX ISR;  c.  Lex MV 5/12: inf and IL defect c/w prior MI, no ischemia, EF 54%  . DDD (degenerative disc disease), lumbar   . OA (osteoarthritis)     neck and shoulders  . At risk for sleep apnea     STOP-BANG= 5    SENT TO PCP 11-23-2014  . Wears glasses   . Recurrent prostate adenocarcinoma [facility]) urologist--  dr Irine Seal    first dx  2001,  T2b  N0  M0,  Gleason 6--  s/p  radical prostatectomy/   recurrent 11/ 2012  s/p salvage radiation   . Bladder cancer ([place])     2016  . Recurrent UTI   . Rosacea   . Spinal stenosis    . Nocturia     Past Surgical History  Procedure Laterality Date  . Lumbar laminectomy  04-25-2011  . Penile prosthesis placement  2003  . Penile prosthesis implant N/A 02/16/2013    Procedure: REPLACEMENT OF AMS PENILE PROTHESIS INFLATABLE;  Surgeon: Malka So, MD;  Location: [facility];  Service: Urology;  Laterality: N/A;  . Radical prostatectomy w/ bilateral pelvic lymphadenectomy  08-07-2000  . Inguinal hernia repair Left 11-03-2003  . Cysto w/ placement right ureteral stent  07-19-2004  . Right ureteroscopic laser lithotripsy stone extraction and stent  07-26-2004  . Extracorporeal shock wave lithotripsy Left 09-16-2013  . Cardiovascular stress test  04-23-2011  dr Angelena Form    Low risk nuclear study/ fixed mixed basal inferior and inferolateral perfusion defect with wall motion abnormality that suggest prior MI, no significant ishemia/  ef 54%  . Transthoracic echocardiogram  01-21-2010    mild LVH/  ef 60-65%/  trivial TR  . Coronary angioplasty with stent placement  04-29-2003  dr Lyndel Safe    BMS x1 and DES x1 to mid left CFX for total occlusion  99%/  nonobstructive dLAD 60% and pLAD30%,  pRCA 30%,  lateral and mid inferior akinesis,  moderate irregularities LM,  D2 and D3 40%,  ef 50%  . Coronary angioplasty  01-22-2010  dr Darnell Level brodie    successfully cutting angioplasty to in-stent restenosis  mCFX  . Anterior cervical decomp/discectomy fusion  2001    w/ removal bone spur--  1 level  . Lumbar disc surgery  1995  . Colonoscopy  03-04-2007  . Cystoscopy with biopsy N/A 12/01/2014    Procedure: CYSTOSCOPY WITH BIOPSY/FULGURATION;  Surgeon: Malka So, MD;  Location: [facility];  Service: Urology;  Laterality: N/A;  . Cystoscopy N/A 01/03/2015    Procedure: CYSTOSCOPY WITH FULGURATION OF BLEEDERS;  Surgeon: Malka So, MD;  Location: [facility];  Service: Urology;  Laterality: N/A;  . Bone cyst excision  2012    LUMBAR    Family History  Problem  Relation Age of Onset  . Hypertension Other   . Heart attack Father    Social History:  reports that he quit smoking about 13 years ago. His smoking use included Cigarettes. He has a 60 pack-year smoking history. He has never used smokeless tobacco. He reports that he drinks about 12.6 oz of alcohol per week. He reports that he does not use illicit drugs.  Allergies: No Known Allergies  Medications Prior to Admission  Medication Sig Dispense Refill  . allopurinol (ZYLOPRIM) 300 MG tablet Take 300 mg by mouth every evening.     [name] Kitchen aspirin 81 MG tablet Take 81 mg by mouth every evening.     [name] Kitchen atorvastatin (LIPITOR) 80 MG tablet Take 40 mg by mouth every evening.     . Cholecalciferol (VITAMIN D3) 1000 UNITS CAPS Take 1 capsule by mouth daily.     . Fish Oil-Cholecalciferol (FISH OIL + D3 PO) Take by mouth.    . gabapentin (NEURONTIN) 300 MG capsule Take 300 mg by mouth at bedtime.    [name] Kitchen lisinopril (PRINIVIL,ZESTRIL) 40 MG tablet Take 20 mg by mouth every evening.     . meloxicam (MOBIC) 15 MG tablet Take 15 mg by mouth every evening.     . metoprolol tartrate (LOPRESSOR) 25 MG tablet Take 12.5 mg by mouth every evening.     . Multiple Vitamin (MULTIVITAMIN WITH MINERALS) TABS Take 1 tablet by mouth every evening.     . vitamin B-12 (CYANOCOBALAMIN) 500 MCG tablet Take 500 mcg by mouth daily.    . nitroGLYCERIN (NITROSTAT) 0.4 MG SL tablet Place 1 tablet (0.4 mg total) under the tongue every 5 (five) minutes as needed for chest pain. 20 tablet 5  . traMADol (ULTRAM) 50 MG tablet Take 1-2 tablets (50-100 mg total) by mouth every 6 (six) hours as needed for moderate pain or severe pain. (Patient taking differently: Take 25-50 mg by mouth every 6 (six) hours as needed for moderate pain or severe pain. ) 240 tablet 1    No results found for this or any previous visit (from the past 48 hour(s)). No results found.  Review of Systems  Constitutional: Negative.   HENT: Negative.   Eyes: Negative.    Respiratory: Negative.   Cardiovascular: Negative.   Gastrointestinal: Negative.   Genitourinary: Negative.   Musculoskeletal: Positive for back pain.  Neurological:       Pain in Left leg and weakness of his Left foot  Endo/Heme/Allergies: Negative.   Psychiatric/Behavioral: Negative.     Blood pressure 140/84, pulse  89, temperature 97.5 F (36.4 C), temperature source Oral, resp. rate 18, height 5\' 11"  (1.803 m), weight 99.791 kg (220 lb), SpO2 97 %. Physical Exam  Constitutional: He appears well-developed.  HENT:  Head: Normocephalic.  Eyes: Pupils are equal, round, and reactive to light.  Neck: Normal range of motion.  Cardiovascular: Normal rate.   Respiratory: Effort normal.  GI: Soft.  Musculoskeletal: He exhibits tenderness.  Neurological:  Positive Straight Leg raising on the left and weakness of his dorsiflexors of his left foot.  Skin: Skin is warm.  Psychiatric: He has a normal mood and affect.     Assessment/Plan:Central Lumbar Laminectomy at L-4-L-5 for Spinal Stenosis and a Microdiscectomy at L-4-L-5 for a HNP  Zenith Lamphier A 11/29/2015, 1:22 PM

## 2015-11-30 ENCOUNTER — Encounter (HOSPITAL_COMMUNITY): Payer: Self-pay | Admitting: Orthopedic Surgery

## 2015-11-30 DIAGNOSIS — M7138 Other bursal cyst, other site: Secondary | ICD-10-CM | POA: Diagnosis not present

## 2015-11-30 MED ORDER — METHOCARBAMOL 500 MG PO TABS: 500.0000 mg | ORAL_TABLET | Freq: Four times a day (QID) | ORAL | Status: DC | PRN

## 2015-11-30 NOTE — Evaluation (Signed)
Physical Therapy Evaluation Patient Details Name: [name] MRN: [id number] DOB: 10-26-1946 Today's Date: 11/30/2015   History of Present Illness  DECOMPRESSION LUMBAR LAMINECTOMY L4-L5 MICRODISCECTOMY L4-L5 LEFT EXCISION SENOVAL CYST LUMBAR 4-5 RIGHT PARICAL FACETECTOMY LUMBAR 4-5 RIGHT (Right  Clinical Impression  Patient is mobilizing well with minor complaints that medication  Has made him feel woozy. Will return  This AM to ambulate without AD or SPC. Plans for Dc today. Patient will benefit from PT to address problems listed in the note below.    Follow Up Recommendations No PT follow up    Equipment Recommendations  None recommended by PT    Recommendations for Other Services       Precautions / Restrictions Precautions Precautions: Back Precaution Booklet Issued: Yes (comment) Precaution Comments: stated 2/3, written instructions provided Restrictions Weight Bearing Restrictions: No      Mobility  Bed Mobility Overal bed mobility: Modified Independent                Transfers Overall transfer level: Needs assistance Equipment used: Rolling walker (2 wheeled) Transfers: Sit to/from Stand Sit to Stand: Supervision         General transfer comment: cues for technique, bqck precautions, to turn square to chair before sitting down, reach for arms.  Ambulation/Gait Ambulation/Gait assistance: Supervision Ambulation Distance (Feet): 400 Feet Assistive device: Rolling walker (2 wheeled) Gait Pattern/deviations: Step-through pattern     General Gait Details: 15' in room, holding to bed to get around foot and back to the L eft side without RW.  Stairs            Wheelchair Mobility    Modified Rankin (Stroke Patients Only)       Balance                                             Pertinent Vitals/Pain Pain Assessment: No/denies pain    Home Living Family/patient expects to be discharged to:: Private  residence Living Arrangements: Spouse/significant other;Other relatives Available Help at Discharge: Family Type of Home: House Home Access: Stairs to enter Entrance Stairs-Rails: Can reach both Entrance Stairs-Number of Steps: 3 Home Layout: One level Home Equipment: Cane - single point      Prior Function Level of Independence: Independent               Hand Dominance        Extremity/Trunk Assessment   Upper Extremity Assessment: Defer to OT evaluation           Lower Extremity Assessment: RLE deficits/detail;LLE deficits/detail RLE Deficits / Details: wfl, LLE Deficits / Details: dorsiflexion, toe ext=4/5  Cervical / Trunk Assessment: Normal  Communication   Communication: No difficulties  Cognition Arousal/Alertness: Awake/alert Behavior During Therapy: WFL for tasks assessed/performed Overall Cognitive Status: Within Functional Limits for tasks assessed                      General Comments      Exercises        Assessment/Plan    PT Assessment Patient needs continued PT services  PT Diagnosis Difficulty walking   PT Problem List Decreased activity tolerance;Decreased knowledge of precautions;Decreased mobility  PT Treatment Interventions DME instruction;Gait training;Functional mobility training   PT Goals (Current goals can be found in the Care Plan section) Acute Rehab PT Goals Patient Stated Goal:  to get some sleep PT Goal Formulation: With patient Time For Goal Achievement: 12/01/15 Potential to Achieve Goals: Good    Frequency Min 1X/week   Barriers to discharge        Co-evaluation               End of Session   Activity Tolerance: Patient tolerated treatment well Patient left: in bed;with call bell/phone within reach Nurse Communication: Mobility status    Functional Assessment Tool Used: clinical judgement Functional Limitation: Mobility: Walking and moving around Mobility: Walking and Moving Around  Current Status [id number]): At least 1 percent but less than 20 percent impaired, limited or restricted Mobility: Walking and Moving Around Goal Status [phone]): 0 percent impaired, limited or restricted    Time: 0825-0850 PT Time Calculation (min) (ACUTE ONLY): 25 min   Charges:   PT Evaluation $PT Eval Low Complexity: 1 Procedure PT Treatments $Gait Training: 8-22 mins   PT G Codes:   PT G-Codes **NOT FOR INPATIENT CLASS** Functional Assessment Tool Used: clinical judgement Functional Limitation: Mobility: Walking and moving around Mobility: Walking and Moving Around Current Status [id number]): At least 1 percent but less than 20 percent impaired, limited or restricted Mobility: Walking and Moving Around Goal Status [phone]): 0 percent impaired, limited or restricted    Claretha Cooper 11/30/2015, 9:04 AM Tresa Endo PT [phone]

## 2015-11-30 NOTE — Progress Notes (Signed)
Subjective: 1 Day Post-Op Procedure(s) (LRB):  DECOMPRESSION LUMBAR LAMINECTOMY L4-L5 MICRODISCECTOMY L4-L5 LEFT    EXCISION SENOVAL CYST LUMBAR 4-5 RIGHT  PARICAL FACETECTOMY LUMBAR 4-5 RIGHT  (Right) Patient reports pain as 2 on 0-10 scale.    Objective: Vital signs in last 24 hours: Temp:  [97.5 F (36.4 C)-98.4 F (36.9 C)] 98.3 F (36.8 C) (01/05 0552) Pulse Rate:  [89-107] 91 (01/05 0552) Resp:  [8-18] 16 (01/05 0552) BP: (110-154)/(68-88) 134/76 mmHg (01/05 0552) SpO2:  [93 %-100 %] 95 % (01/05 0552) Weight:  [99.791 kg (220 lb)] 99.791 kg (220 lb) (01/04 1209)  Intake/Output from previous day: 01/04 0701 - 01/05 0700 In: 4371.7 [P.O.:600; I.V.:3621.7; IV Piggyback:150] Out: 1450 [Urine:1350; Blood:100] Intake/Output this shift:    No results for input(s): HGB in the last 72 hours. No results for input(s): WBC, RBC, HCT, PLT in the last 72 hours. No results for input(s): NA, K, CL, CO2, BUN, CREATININE, GLUCOSE, CALCIUM in the last 72 hours. No results for input(s): LABPT, INR in the last 72 hours.  Neurologically intact Compartment soft  Assessment/Plan: 1 Day Post-Op Procedure(s) (LRB):  DECOMPRESSION LUMBAR LAMINECTOMY L4-L5 MICRODISCECTOMY L4-L5 LEFT    EXCISION SENOVAL CYST LUMBAR 4-5 RIGHT  PARICAL FACETECTOMY LUMBAR 4-5 RIGHT  (Right) Up with therapy Discharge home with home health  [name] A 11/30/2015, 7:22 AM

## 2015-11-30 NOTE — Progress Notes (Signed)
Physical Therapy Treatment Patient Details Name: [name] MRN: [id number] DOB: 1946/04/10 Today's Date: 11/30/2015    History of Present Illness DECOMPRESSION LUMBAR LAMINECTOMY L4-L5 MICRODISCECTOMY L4-L5 LEFT EXCISION SENOVAL CYST LUMBAR 4-5 RIGHT PARICAL FACETECTOMY LUMBAR 4-5 RIGHT (Right    PT Comments    Patient is progressing well, still a little weak for walking due to meds, per patient. Ready for DC today.  Follow Up Recommendations  No PT follow up     Equipment Recommendations  None recommended by PT    Recommendations for Other Services       Precautions / Restrictions Precautions Precautions: Back Precaution Booklet Issued: Yes (comment) Restrictions Weight Bearing Restrictions: No    Mobility  Bed Mobility Overal bed mobility: Modified Independent                Transfers   Equipment used: Straight cane Transfers: Sit to/from Stand Sit to Stand: Supervision            Ambulation/Gait Ambulation/Gait assistance: Min guard Ambulation Distance (Feet): 200 Feet   Gait Pattern/deviations: Step-through pattern   Gait velocity interpretation: Below normal speed for age/gender General Gait Details: gait is slower with SPC, patient relates remaining effects of medication , lack of sleep and no glasses.   Stairs            Wheelchair Mobility    Modified Rankin (Stroke Patients Only)       Balance                                    Cognition Arousal/Alertness: Awake/alert Behavior During Therapy: WFL for tasks assessed/performed Overall Cognitive Status: Within Functional Limits for tasks assessed                      Exercises      General Comments        Pertinent Vitals/Pain Pain Assessment: No/denies pain    Home Living Family/patient expects to be discharged to:: Private residence Living Arrangements: Spouse/significant other;Other relatives Available Help at Discharge:  Family Type of Home: House Home Access: Stairs to enter       Additional Comments: has a sink next to commode    Prior Function Level of Independence: Independent          PT Goals (current goals can now be found in the care plan section) Acute Rehab PT Goals Patient Stated Goal: get head clear  Progress towards PT goals: Progressing toward goals    Frequency       PT Plan Current plan remains appropriate    Co-evaluation             End of Session   Activity Tolerance: Patient tolerated treatment well Patient left: in bed;with call bell/phone within reach     Time: 1352-1420 PT Time Calculation (min) (ACUTE ONLY): 28 min  Charges:  $Gait Training: 8-22 mins $Self Care/Home Management: 8-22                    G Codes:      Claretha Cooper 11/30/2015, 2:32 PM

## 2015-11-30 NOTE — Evaluation (Signed)
Occupational Therapy Evaluation Patient Details Name: [name] MRN: [id number] DOB: 06-19-1946 Today's Date: 11/30/2015    History of Present Illness DECOMPRESSION LUMBAR LAMINECTOMY L4-L5 MICRODISCECTOMY L4-L5 LEFT EXCISION OF CYST LUMBAR 4-5 RIGHT PARTIAL FACETECTOMY LUMBAR 4-5 RIGHT (Right   Clinical Impression   This 70 year old man was admitted for the above surgery.  He has had back surgeries in the past.  Reviewed all back precautions and ADL modifications.  Pt verbalizes understanding of all.  No further OT is needed at this time.    Follow Up Recommendations  No OT follow up;Supervision/Assistance - 24 hour    Equipment Recommendations  None recommended by OT    Recommendations for Other Services       Precautions / Restrictions Precautions Precautions: Back Precaution Booklet Issued: Yes (comment) Precaution Comments: stated 2/3, written instructions provided Restrictions Weight Bearing Restrictions: No      Mobility Bed Mobility Overal bed mobility: Modified Independent                Transfers Overall transfer level: Needs assistance Equipment used: Rolling walker (2 wheeled) Transfers: Sit to/from Stand Sit to Stand: Supervision         General transfer comments:  With PT    Balance                                            ADL Overall ADL's : Needs assistance/impaired                                       General ADL Comments: pt is able to complete ADL with set up, sit to stand.  He is able to cross legs without pain and pulling.Prior to sx, he bent forward for this. Pt has had several back sxs in the past and he has been using comfort height commode and sink without difficulty.  He keeps wet wipes on hand.  Reviewed sidestepping into tub, but did not practice:  pt still feels affected by anesthesia, pain meds and sinuses.  Pt had been leaning forward to spit into sink:  Recommended he use  cup or emesis basin and empty this into sink. Verbalizes understanding of all education. He will have 24/7 assistance at home.     Vision     Perception     Praxis      Pertinent Vitals/Pain Pain Assessment: No/denies pain     Hand Dominance     Extremity/Trunk Assessment Upper Extremity Assessment Upper Extremity Assessment: Overall WFL for tasks assessed      Cervical / Trunk Assessment Cervical / Trunk Assessment: Normal   Communication Communication Communication: No difficulties   Cognition Arousal/Alertness: Awake/alert Behavior During Therapy: WFL for tasks assessed/performed Overall Cognitive Status: Within Functional Limits for tasks assessed                     General Comments       Exercises       Shoulder Instructions      Home Living Family/patient expects to be discharged to:: Private residence Living Arrangements: Spouse/significant other;Other relatives Available Help at Discharge: Family Type of Home: House Home Access: Stairs to enter [employer] of Steps: 3 Entrance Stairs-Rails: Can reach both Home Layout: One level  Bathroom Shower/Tub: Tub/shower unit;Curtain Shower/tub characteristics: Architectural technologist: Handicapped height Bathroom Accessibility: Yes   Home Equipment: Cane - single point   Additional Comments: has a sink next to commode      Prior Functioning/Environment Level of Independence: Independent             OT Diagnosis: Generalized weakness   OT Problem List:     OT Treatment/Interventions:      OT Goals(Current goals can be found in the care plan section) Acute Rehab OT Goals Patient Stated Goal: get head clear  OT Goal Formulation: All assessment and education complete, DC therapy  OT Frequency:     Barriers to D/C:            Co-evaluation              End of Session    Activity Tolerance:  (still affected by pain meds/anesthesia) Patient left: in bed;with  call bell/phone within reach   Time: 1042-1055 OT Time Calculation (min): 13 min Charges:  OT General Charges $OT Visit: 1 Procedure OT Evaluation $OT Eval Low Complexity: 1 Procedure G-Codes: OT G-codes **NOT FOR INPATIENT CLASS** Functional Assessment Tool Used: clinical judgment Functional Limitation: Self care Self Care Current Status [id number]): At least 1 percent but less than 20 percent impaired, limited or restricted Self Care Goal Status [id number]): At least 1 percent but less than 20 percent impaired, limited or restricted Self Care Discharge Status [phone]): At least 1 percent but less than 20 percent impaired, limited or restricted  [facility] 11/30/2015, 11:25 AM Lesle Chris, OTR/L [phone] 11/30/2015

## 2015-11-30 NOTE — Discharge Instructions (Signed)
For the first three days, remove your dressing, tape a piece of saran wrap over your incision. Take your shower, then remove the saran wrap and put a clean dressing on. After three days you can shower without the saran wrap.  No lifting or bending. No driving while taking pain medications. Take aspirin 325mg  once daily to prevent blood clots. Call Dr. Gladstone Lighter if any wound complications or temperature of 101 degrees F or over.  Call the office for an appointment to see Dr. Gladstone Lighter in two weeks: [phone] and ask for Dr. Charlestine Night nurse, Brunilda Payor.

## 2015-11-30 NOTE — Care Management Note (Signed)
Case Management Note  Patient Details  Name: [name] MRN: [id number] Date of Birth: 1946-08-04  Subjective/Objective:   71 y.o. M admitted 11/29/2015 for DECOMPRESSION LUMBAR LAMINECTOMY L4-L5 MICRODISCECTOMY L4-L5 LEFT EXCISION SENOVAL CYST LUMBAR 4-5 RIGHT PARICAL FACETECTOMY LUMBAR 4-5 RIGHT (Right)                  Action/Plan: No HH needs.  Anticipate discharge home today. No further CM needs but will be available should additional discharge needs arise.   Expected Discharge Date:                  Expected Discharge Plan:     In-House Referral:     Discharge planning Services     Post Acute Care Choice:    Choice offered to:     DME Arranged:    DME Agency:     HH Arranged:    [place] Agency:     Status of Service:     Medicare Important Message Given:    Date Medicare IM Given:    Medicare IM give by:    Date Additional Medicare IM Given:    Additional Medicare Important Message give by:     If discussed at [employer] of Stay Meetings, dates discussed:    Additional Comments:  Delrae Sawyers, RN 11/30/2015, 9:48 AM

## 2015-12-03 NOTE — Discharge Summary (Signed)
Physician Discharge Summary   Patient ID: [name] MRN: [id number] DOB/AGE: Sep 29, 1946 70 y.o.  Admit date: 11/29/2015 Discharge date: 11/30/2015  Primary Diagnosis: Lumbar spinal stenosis  Admission Diagnoses:  Past Medical History  Diagnosis Date  . GOUT 06/14/2008  . S/P radiation therapy 10/02/11 - 11/25/11    Central Lower Pelvis: 4098 cGy/38 Fractions--  for recurrent prostate cancer  . PONV (postoperative nausea and vomiting)   . History of myocardial infarction     04-29-2003  -- posterior and lateral wall MI  s/p  stent x2 to  left mid CFX  . Hypertension   . S/P coronary artery stent placement     BMS x1  and DES x1  to mid left CFX  04-29-2003  . Hyperlipidemia   . H/O agent Orange exposure   . Diverticulosis of colon   . Nephrolithiasis     bilateral  . History of kidney stones   . Lesion of bladder   . Organic impotence     arterial insuff.  . SUI (stress urinary incontinence), male   . CORONARY ARTERY DISEASE CARDIOLOGIST--  DR WALL AND DR MCALHANY    ETT  05-06-2014,  no ischemia or st changes;  low risk nuclear study 04-23-2011--- a. s/p post wall MI  2004 => overlapping stents in the CFX (one Zeta stent and one Taxus DES);  b. [place] => LHC 2/11: Normal LM, normal LAD, normal Dx, CFX 80% ISR, PDA 30-40%, EF 55%. PCI: Cutting balloon angioplasty to the CFX ISR;  c.  Lex MV 5/12: inf and IL defect c/w prior MI, no ischemia, EF 54%  . DDD (degenerative disc disease), lumbar   . OA (osteoarthritis)     neck and shoulders  . At risk for sleep apnea     STOP-BANG= 5    SENT TO PCP 11-23-2014  . Wears glasses   . Recurrent prostate adenocarcinoma [facility]) urologist--  dr Irine Seal    first dx  2001,  T2b  N0  M0,  Gleason 6--  s/p  radical prostatectomy/   recurrent 11/ 2012  s/p salvage radiation   . Bladder cancer ([place])     2016  . Recurrent UTI   . Rosacea   . Spinal stenosis   . Nocturia    Discharge Diagnoses:   Active Problems:   Spinal stenosis,  lumbar region, with neurogenic claudication  Estimated body mass index is 30.7 kg/(m^2) as calculated from the following:   Height as of this encounter: 5' 11"  (1.803 m).   Weight as of this encounter: 99.791 kg (220 lb).  Procedure:  Procedure(s) (LRB):  DECOMPRESSION LUMBAR LAMINECTOMY L4-L5 MICRODISCECTOMY L4-L5 LEFT    EXCISION SENOVAL CYST LUMBAR 4-5 RIGHT  PARICAL FACETECTOMY LUMBAR 4-5 RIGHT  (Right)   Consults: None  HPI: He developed left leg pain and weakness of his Left Foot. He had a Hemilaminectomy at L-4-L-5 on the Opposite Right Side several years ago,by another Psychologist, sport and exercise.Central Decompressive Lumbar Laminectomy and Microdiscectomy on the Left   Laboratory Data: Hospital Outpatient Visit on 11/15/2015  Component Date Value Ref Range Status  . WBC 11/15/2015 7.4  4.0 - 10.5 K/uL Final  . RBC 11/15/2015 4.52  4.22 - 5.81 MIL/uL Final  . Hemoglobin 11/15/2015 14.2  13.0 - 17.0 g/dL Final  . HCT 11/15/2015 42.8  39.0 - 52.0 % Final  . MCV 11/15/2015 94.7  78.0 - 100.0 fL Final  . MCH 11/15/2015 31.4  26.0 - 34.0 pg Final  .  MCHC 11/15/2015 33.2  30.0 - 36.0 g/dL Final  . RDW 11/15/2015 13.9  11.5 - 15.5 % Final  . Platelets 11/15/2015 180  150 - 400 K/uL Final  . Neutrophils Relative % 11/15/2015 67   Final  . Neutro Abs 11/15/2015 4.9  1.7 - 7.7 K/uL Final  . Lymphocytes Relative 11/15/2015 21   Final  . Lymphs Abs 11/15/2015 1.6  0.7 - 4.0 K/uL Final  . Monocytes Relative 11/15/2015 8   Final  . Monocytes Absolute 11/15/2015 0.6  0.1 - 1.0 K/uL Final  . Eosinophils Relative 11/15/2015 3   Final  . Eosinophils Absolute 11/15/2015 0.3  0.0 - 0.7 K/uL Final  . Basophils Relative 11/15/2015 1   Final  . Basophils Absolute 11/15/2015 0.1  0.0 - 0.1 K/uL Final  . Sodium 11/15/2015 141  135 - 145 mmol/L Final  . Potassium 11/15/2015 4.6  3.5 - 5.1 mmol/L Final  . Chloride 11/15/2015 103  101 - 111 mmol/L Final  . CO2 11/15/2015 28  22 - 32 mmol/L Final  . Glucose, Bld  11/15/2015 114* 65 - 99 mg/dL Final  . BUN 11/15/2015 24* 6 - 20 mg/dL Final  . Creatinine, Ser 11/15/2015 1.57* 0.61 - 1.24 mg/dL Final  . Calcium 11/15/2015 9.6  8.9 - 10.3 mg/dL Final  . Total Protein 11/15/2015 7.3  6.5 - 8.1 g/dL Final  . Albumin 11/15/2015 4.3  3.5 - 5.0 g/dL Final  . AST 11/15/2015 29  15 - 41 U/L Final  . ALT 11/15/2015 14* 17 - 63 U/L Final  . Alkaline Phosphatase 11/15/2015 58  38 - 126 U/L Final  . Total Bilirubin 11/15/2015 0.9  0.3 - 1.2 mg/dL Final  . GFR calc non Af Amer 11/15/2015 43* >60 mL/min Final  . GFR calc Af Amer 11/15/2015 50* >60 mL/min Final   Comment: (NOTE) The eGFR has been calculated using the CKD EPI equation. This calculation has not been validated in all clinical situations. eGFR's persistently <60 mL/min signify possible Chronic Kidney Disease.   . Anion gap 11/15/2015 10  5 - 15 Final  . Color, Urine 11/15/2015 YELLOW  YELLOW Final  . APPearance 11/15/2015 CLEAR  CLEAR Final  . Specific Gravity, Urine 11/15/2015 1.022  1.005 - 1.030 Final  . pH 11/15/2015 5.5  5.0 - 8.0 Final  . Glucose, UA 11/15/2015 NEGATIVE  NEGATIVE mg/dL Final  . Hgb urine dipstick 11/15/2015 NEGATIVE  NEGATIVE Final  . Bilirubin Urine 11/15/2015 NEGATIVE  NEGATIVE Final  . Ketones, ur 11/15/2015 NEGATIVE  NEGATIVE mg/dL Final  . Protein, ur 11/15/2015 NEGATIVE  NEGATIVE mg/dL Final  . Nitrite 11/15/2015 NEGATIVE  NEGATIVE Final  . Leukocytes, UA 11/15/2015 SMALL* NEGATIVE Final  . MRSA, PCR 11/15/2015 NEGATIVE  NEGATIVE Final  . Staphylococcus aureus 11/15/2015 NEGATIVE  NEGATIVE Final   Comment:        The Xpert SA Assay (FDA approved for NASAL specimens in patients over 70 years of age), is one component of a comprehensive surveillance program.  Test performance has been validated by [facility] for patients greater than or equal to 48 year old. It is not intended to diagnose infection nor to guide or monitor treatment.   . Squamous Epithelial /  LPF 11/15/2015 0-5* NONE SEEN Final  . WBC, UA 11/15/2015 6-30  0 - 5 WBC/hpf Final  . RBC / HPF 11/15/2015 0-5  0 - 5 RBC/hpf Final  . Bacteria, UA 11/15/2015 RARE* NONE SEEN Final  . Prothrombin Time  11/15/2015 13.5  11.6 - 15.2 seconds Final  . INR 11/15/2015 1.01  0.00 - 1.49 Final  . aPTT 11/15/2015 30  24 - 37 seconds Final     X-Rays:Dg Chest 2 View  11/15/2015  CLINICAL DATA:  Preoperative evaluation for lumbar decompression EXAM: CHEST - 2 VIEW COMPARISON:  02/10/2013 FINDINGS: The heart size and mediastinal contours are within normal limits. Both lungs are clear. The visualized skeletal structures are unremarkable. IMPRESSION: No active disease. Electronically Signed   By: Inez Catalina M.D.   On: 11/15/2015 15:28   Dg Lumbar Spine 2-3 Views  11/15/2015  CLINICAL DATA:  Preoperative exam prior to central decompression and microdiskectomy at L4-5 on the left EXAM: LUMBAR SPINE - 2-3 VIEW COMPARISON:  Abdominal pelvic CT scan of September 05, 2014 FINDINGS: The lumbar vertebral bodies are preserved in height. There is mild disc space narrowing at L2-3 and at L3-4 and moderate narrowing at L5-S1. There is facet joint hypertrophy at L3-4, L4-5, and L5-S1. There is grade 1 anterolisthesis of L4 with respect L5. There are small anterior near bridging osteophytes at T12-L1, L1-L2, and L3-L4. The pedicles and transverse processes are intact. There are calcifications which project over the inferior endplate of L1 and mid body of L2 on the lateral image which likely lie external to the vertebral bodies on the frontal view. IMPRESSION: Numbering of the lumbar vertebral levels as indicated. Disc space narrowing and facet joint hypertrophy as described. Electronically Signed   By: David  Martinique M.D.   On: 11/15/2015 15:31   Dg Spine Portable 1 View  11/29/2015  CLINICAL DATA:  Central decompression a microdiscectomy at L4-5. EXAM: PORTABLE SPINE - 1 VIEW COMPARISON:  11/15/2015 FINDINGS: Image labeled  portable # 3 has tissue spreaders in place at L4 and L5, with a curved tip probe oriented towards the L4-5 intervertebral level. Posterior decompression noted. Grade 1 anterolisthesis at L4-5 and loss of intervertebral disc height at L5-S 1. Aortoiliac atherosclerotic vascular disease. IMPRESSION: 1. Image portable # 3:  The curved tip probe is at the L4-5 level. Electronically Signed   By: Van Clines M.D.   On: 11/29/2015 15:01   Dg Spine Portable 1 View  11/29/2015  CLINICAL DATA:  Central decompression and micro discectomy L4-5 EXAM: PORTABLE SPINE - 1 VIEW COMPARISON:  November 15, 2015 FINDINGS: Cross-table lateral lumbar image labelled #2 submitted. Metallic probe tip is posterior to the L4-5 interspace level. There is grade 1/IV anterolisthesis of L4 on L5. No other spondylolisthesis. There is moderate disc space narrowing at L4-5 and L5-S1. No fracture. There is extensive atherosclerotic change in the aorta. IMPRESSION: Metallic probe tip is posterior to the L4-5 interspace level. Stable anterolisthesis of L4 on L5. Moderate disc space narrowing at L4-5 and L5-S1, stable. Electronically Signed   By: Lowella Grip III M.D.   On: 11/29/2015 14:39   Dg Spine Portable 1 View  11/29/2015  CLINICAL DATA:  Lumbar decompression and microdiskectomy at L4-5 EXAM: PORTABLE SPINE - 1 VIEW COMPARISON:  11/15/2015 FINDINGS: Radiopaque needles are noted within the posterior soft tissues at the L4-5 level and just below at the mid L5 level. Anterolisthesis at L4-5 is noted. Disc space narrowing is again seen at L5-S1. IMPRESSION: Intraoperative localization at L4-5 Electronically Signed   By: Inez Catalina M.D.   On: 11/29/2015 14:14    EKG: Orders placed or performed in visit on 04/20/15  . EKG 12-Lead     Hospital Course: [name] is  a 70 y.o. who was admitted to [facility]. They were brought to the operating room on 11/29/2015 and underwent Procedure(s):  DECOMPRESSION LUMBAR  LAMINECTOMY L4-L5 MICRODISCECTOMY L4-L5 LEFT    EXCISION SENOVAL CYST LUMBAR 4-5 RIGHT  PARICAL FACETECTOMY LUMBAR 4-5 RIGHT .  Patient tolerated the procedure well and was later transferred to the recovery room and then to the orthopaedic floor for postoperative care.  They were given PO and IV analgesics for pain control following their surgery.  They were given 24 hours of postoperative antibiotics of  Anti-infectives    Start     Dose/Rate Route Frequency Ordered Stop   11/29/15 2200  ceFAZolin (ANCEF) IVPB 1 g/50 mL premix     1 g 100 mL/hr over 30 Minutes Intravenous 3 times per day 11/29/15 1717 11/30/15 1411   11/29/15 1555  polymyxin B 500,000 Units, bacitracin 50,000 Units in sodium chloride irrigation 0.9 % 500 mL irrigation  Status:  Discontinued       As needed 11/29/15 1556 11/29/15 1605   11/29/15 1215  ceFAZolin (ANCEF) IVPB 2 g/50 mL premix     2 g 100 mL/hr over 30 Minutes Intravenous On call to O.R. 11/29/15 1207 11/29/15 1350     and started on DVT prophylaxis in the form of Aspirin.   PT was ordered.  Discharge planning consulted to help with postop disposition and equipment needs.  Patient had a fair night on the evening of surgery.  They started to get up OOB with therapy on day one.  Dressing was changed  and the incision was clean and dry.  Patient was seen in rounds and was ready to go home.   Diet: Cardiac diet Activity:WBAT Follow-up:in 2 weeks Disposition - Home Discharged Condition: stable   Discharge Instructions    Call MD / Call 911    Complete by:  As directed   If you experience chest pain or shortness of breath, CALL 911 and be transported to the hospital emergency room.  If you develope a fever above 101 F, pus (white drainage) or increased drainage or redness at the wound, or calf pain, call your surgeon's office.     Constipation Prevention    Complete by:  As directed   Drink plenty of fluids.  Prune juice may be helpful.  You may use a stool  softener, such as Colace (over the counter) 100 mg twice a day.  Use MiraLax (over the counter) for constipation as needed.     Diet - low sodium heart healthy    Complete by:  As directed      Discharge instructions    Complete by:  As directed   For the first three days, remove your dressing, tape a piece of saran wrap over your incision. Take your shower, then remove the saran wrap and put a clean dressing on. After three days you can shower without the saran wrap.  No lifting or bending. No driving while taking pain medications. Take aspirin 325m once daily to prevent blood clots. Call Dr. GGladstone Lighterif any wound complications or temperature of 101 degrees F or over.  Call the office for an appointment to see Dr. GGladstone Lighterin two weeks: [phone] ask for Dr. GCharlestine Nightnurse, TBrunilda Payor     Increase activity slowly as tolerated    Complete by:  As directed             Medication List    STOP taking these medications  aspirin 81 MG tablet      TAKE these medications        allopurinol 300 MG tablet  Commonly known as:  ZYLOPRIM  Take 300 mg by mouth every evening.     atorvastatin 80 MG tablet  Commonly known as:  LIPITOR  Take 40 mg by mouth every evening.     FISH OIL + D3 PO  Take by mouth.     gabapentin 300 MG capsule  Commonly known as:  NEURONTIN  Take 300 mg by mouth at bedtime.     lisinopril 40 MG tablet  Commonly known as:  PRINIVIL,ZESTRIL  Take 20 mg by mouth every evening.     meloxicam 15 MG tablet  Commonly known as:  MOBIC  Take 15 mg by mouth every evening.     methocarbamol 500 MG tablet  Commonly known as:  ROBAXIN  Take 1 tablet (500 mg total) by mouth every 6 (six) hours as needed for muscle spasms.     metoprolol tartrate 25 MG tablet  Commonly known as:  LOPRESSOR  Take 12.5 mg by mouth every evening.     multivitamin with minerals Tabs tablet  Take 1 tablet by mouth every evening.     nitroGLYCERIN 0.4 MG SL tablet   Commonly known as:  NITROSTAT  Place 1 tablet (0.4 mg total) under the tongue every 5 (five) minutes as needed for chest pain.     traMADol 50 MG tablet  Commonly known as:  ULTRAM  Take 1-2 tablets (50-100 mg total) by mouth every 6 (six) hours as needed for moderate pain or severe pain.     vitamin B-12 500 MCG tablet  Commonly known as:  CYANOCOBALAMIN  Take 500 mcg by mouth daily.     Vitamin D3 1000 units Caps  Take 1 capsule by mouth daily.           Follow-up Information    Follow up with GIOFFRE,RONALD A, MD. Schedule an appointment as soon as possible for a visit in 2 weeks.   Specialty:  Orthopedic Surgery   Contact information:   [address] [place] [address] [phone]       Signed: Ardeen Jourdain, PA-C Orthopaedic Surgery 12/03/2015, 5:42 PM

## 2015-12-12 DIAGNOSIS — Z4789 Encounter for other orthopedic aftercare: Secondary | ICD-10-CM | POA: Diagnosis not present

## 2015-12-12 DIAGNOSIS — S43109A Unspecified dislocation of unspecified acromioclavicular joint, initial encounter: Secondary | ICD-10-CM | POA: Diagnosis not present

## 2015-12-25 DIAGNOSIS — S34109D Unspecified injury to unspecified level of lumbar spinal cord, subsequent encounter: Secondary | ICD-10-CM | POA: Diagnosis not present

## 2015-12-25 DIAGNOSIS — Z4789 Encounter for other orthopedic aftercare: Secondary | ICD-10-CM | POA: Diagnosis not present

## 2015-12-25 DIAGNOSIS — G8929 Other chronic pain: Secondary | ICD-10-CM | POA: Diagnosis not present

## 2015-12-25 DIAGNOSIS — M5442 Lumbago with sciatica, left side: Secondary | ICD-10-CM | POA: Diagnosis not present

## 2015-12-29 DIAGNOSIS — G8929 Other chronic pain: Secondary | ICD-10-CM | POA: Diagnosis not present

## 2015-12-29 DIAGNOSIS — M5442 Lumbago with sciatica, left side: Secondary | ICD-10-CM | POA: Diagnosis not present

## 2016-01-02 DIAGNOSIS — M5442 Lumbago with sciatica, left side: Secondary | ICD-10-CM | POA: Diagnosis not present

## 2016-01-02 DIAGNOSIS — G8929 Other chronic pain: Secondary | ICD-10-CM | POA: Diagnosis not present

## 2016-01-04 DIAGNOSIS — G8929 Other chronic pain: Secondary | ICD-10-CM | POA: Diagnosis not present

## 2016-01-04 DIAGNOSIS — M5442 Lumbago with sciatica, left side: Secondary | ICD-10-CM | POA: Diagnosis not present

## 2016-01-08 DIAGNOSIS — M5442 Lumbago with sciatica, left side: Secondary | ICD-10-CM | POA: Diagnosis not present

## 2016-01-08 DIAGNOSIS — G8929 Other chronic pain: Secondary | ICD-10-CM | POA: Diagnosis not present

## 2016-01-11 DIAGNOSIS — G8929 Other chronic pain: Secondary | ICD-10-CM | POA: Diagnosis not present

## 2016-01-11 DIAGNOSIS — M5442 Lumbago with sciatica, left side: Secondary | ICD-10-CM | POA: Diagnosis not present

## 2016-01-15 DIAGNOSIS — M5442 Lumbago with sciatica, left side: Secondary | ICD-10-CM | POA: Diagnosis not present

## 2016-01-15 DIAGNOSIS — S34109D Unspecified injury to unspecified level of lumbar spinal cord, subsequent encounter: Secondary | ICD-10-CM | POA: Diagnosis not present

## 2016-01-15 DIAGNOSIS — Z4789 Encounter for other orthopedic aftercare: Secondary | ICD-10-CM | POA: Diagnosis not present

## 2016-01-17 DIAGNOSIS — M5442 Lumbago with sciatica, left side: Secondary | ICD-10-CM | POA: Diagnosis not present

## 2016-01-17 DIAGNOSIS — G8929 Other chronic pain: Secondary | ICD-10-CM | POA: Diagnosis not present

## 2016-01-19 DIAGNOSIS — G8929 Other chronic pain: Secondary | ICD-10-CM | POA: Diagnosis not present

## 2016-01-19 DIAGNOSIS — M5442 Lumbago with sciatica, left side: Secondary | ICD-10-CM | POA: Diagnosis not present

## 2016-01-23 DIAGNOSIS — M5442 Lumbago with sciatica, left side: Secondary | ICD-10-CM | POA: Diagnosis not present

## 2016-01-23 DIAGNOSIS — G8929 Other chronic pain: Secondary | ICD-10-CM | POA: Diagnosis not present

## 2016-02-27 DIAGNOSIS — G8929 Other chronic pain: Secondary | ICD-10-CM | POA: Diagnosis not present

## 2016-02-27 DIAGNOSIS — M5442 Lumbago with sciatica, left side: Secondary | ICD-10-CM | POA: Diagnosis not present

## 2016-02-27 DIAGNOSIS — Z4789 Encounter for other orthopedic aftercare: Secondary | ICD-10-CM | POA: Diagnosis not present

## 2016-04-17 DIAGNOSIS — R310 Gross hematuria: Secondary | ICD-10-CM | POA: Diagnosis not present

## 2016-04-17 DIAGNOSIS — N200 Calculus of kidney: Secondary | ICD-10-CM | POA: Diagnosis not present

## 2016-04-17 DIAGNOSIS — N289 Disorder of kidney and ureter, unspecified: Secondary | ICD-10-CM | POA: Diagnosis not present

## 2016-04-17 DIAGNOSIS — Z0000 Encounter for general adult medical examination without abnormal findings: Secondary | ICD-10-CM | POA: Diagnosis not present

## 2016-04-17 DIAGNOSIS — N390 Urinary tract infection, site not specified: Secondary | ICD-10-CM | POA: Diagnosis not present

## 2016-04-29 ENCOUNTER — Other Ambulatory Visit: Payer: Self-pay | Admitting: Urology

## 2016-05-09 ENCOUNTER — Encounter (HOSPITAL_BASED_OUTPATIENT_CLINIC_OR_DEPARTMENT_OTHER): Payer: Self-pay | Admitting: *Deleted

## 2016-05-09 NOTE — Progress Notes (Signed)
NPO AFTER MN.  ARRIVE AT 0715.  NEEDS ISTAT 8 AND EKG.  MAY TAKE TRAMADOL IF NEEDED AM DOS W/ SIPS OF WATER.

## 2016-05-15 NOTE — H&P (Signed)
Active Problems Problems  1. Atrophy of kidney (N26.1) 2. Bilateral kidney stones (N20.0) 3. Calculus of right ureter (N20.1) 4. Chronic renal insufficiency (N18.9) 5. History of Erectile dysfunction due to arterial insufficiency (N52.01) 6. Gross hematuria (R31.0) 7. History of Gross hematuria (R31.0) 8. 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 Kidney stone on left side (N20.0) 10. Lumbago (M54.5) 11. Male stress incontinence (N39.3) 12. Muscle weakness (M62.81) 13. Muscular incoordination (R27.8) 14. Nocturia (R35.1) 15. Prostate cancer (C61) 16. Pyuria (N39.0) 17. Renal insufficiency (N28.9) 18. Suspected urinary tract infection (N39.0) 19. Urge and stress incontinence (N39.46) 20. Urinary frequency (R35.0) 21. Urinary tract infection (N39.0) History of Present Illness Loma Sousa returns today for a right ureteral stone. He has a history of bilateral renal stones and was being evaluated in December for possible bone mets at the [facility] for his rising PSA with a prior history of prostate cancer treated with RRP followed by salvage RTx. The last PSA we had in November was 0.18. He was found on bonescan to have right ureteral obstruction and then was found to have a 23mm right distal stone with obstruction. A renogram showed 17% function on the right and 83% on the right. He has stable left mid and lower pole stones. The stone in the right ureter was in the RUP in 2015. He had back surgery on 11/29/15 and was having pain prior to the surgery but he no right flank pain. He has some blood spotting occasional and he has worsening SUI. He saw Diane last week and was found to have a staph UTI and is on nitrofurantoin. Renal US today shows a 9.5cm right kidney with no hydro and a KUB shows a 62mm stone in the right distal ureter.  Past Medical History Problems  1. History of Acute cystitis with hematuria (N30.01) 2. History of Acute Myocardial Infarction 3. History of Calculus of ureter (N20.1) 4. History of  Cancer 5. History of Erectile dysfunction due to arterial insufficiency (N52.01) 6. History of Febrile urinary tract infection (N39.0) 7. History of Gout (M10.9) 8. History of Gross hematuria (R31.0) 9. History of Gross hematuria (R31.0) 10. History of cardiac disorder (Z86.79) 11. History of hypercholesterolemia (Z86.39) 12. History of hypertension (Z86.79) 13. Personal history of prostate cancer (Z85.46) Surgical History Problems  1. History of Back Surgery 2. History of Back Surgery 3. History of Cystoscopy With Fulguration 4. History of Cystoscopy With Fulguration Small Lesion (5-59mm) 5. History of Cystoscopy With Ureteroscopy Right 6. History of Neck Surgery 7. History of Prostatectomy Retropubic Radical With Lymph Node Biopsy(S) 8. History of Renal Lithotripsy 9. History of Surg Penis Insertion Of Penile Prosthesis 10. History of Surg Penis Replacement Of Inflatable Penile Prosthesis Current Meds 1. Allopurinol 300 MG Oral Tablet; Therapy: [id number] to Recorded 2. Aspirin 81 MG TABS; Therapy: (Recorded:20Jan2016) to Recorded 3. Atorvastatin Calcium 80 MG Oral Tablet; Therapy: (Recorded:23Sep2014) to Recorded 4. Centrum Silver TABS; Therapy: (Recorded:09Apr2012) to Recorded 5. Fish Oil CAPS; Therapy: (Recorded:09Apr2012) to Recorded 6. Ibuprofen 200 MG Oral Tablet; Therapy: (Recorded:20Jan2016) to Recorded 7. Lisinopril 40 MG Oral Tablet; Therapy: (Recorded:23Sep2014) to Recorded 8. Meloxicam 15 MG Oral Tablet; Therapy: (Recorded:10Oct2012) to Recorded 9. Metoprolol Tartrate 50 MG Oral Tablet; Therapy: (Recorded:20Jan2016) to Recorded 10. Nitrofurantoin Monohyd Macro 100 MG Oral Capsule; Take 1 capsule twice daily; Therapy: [id number] to (Last [id number]) Requested for: [id number] Ordered 11. Nitrostat 0.4 MG Sublingual Tablet Sublingual; Therapy: [id number] to Recorded 12. TraMADol HCl - 50 MG Oral Tablet; as needed per pt; Therapy: (Recorded:08Feb2016) to  Recorded 13.  Vitamin B-12 TABS; Therapy: (Recorded:23Sep2014) to Recorded 14. Vitamin D TABS; Therapy: (Recorded:09Apr2012) to Recorded Allergies Medication  1. No Known Drug Allergies Family History Problems  1. Family history of Death In The Family Father  died age 42-heart disease 2. Family history of Death In The Family Mother  died 45-cirrosis of liver 3. Family history of Heart Disease : Father Social History Problems  1. Activities of daily living (ADL's), independent 2. Alcohol Use (History) 3. Caffeine Use 4. Exercise habits  He indicates he and his wife just joined the gym and will be starting a workout in the very near future. He is very active at work. 5. Former smoker [phone]) 6. Marital History - Currently Married 7. 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 Tobacco Use  quit 7+ yrs ago Review of Systems Genitourinary, constitutional, skin, eye, otolaryngeal, hematologic/lymphatic, cardiovascular, pulmonary, endocrine, musculoskeletal, gastrointestinal, neurological and psychiatric system(s) were reviewed and pertinent findings if present are noted and are otherwise negative.  Genitourinary: urinary frequency, feelings of urinary urgency, nocturia (3-4), incontinence and hematuria.  Gastrointestinal: no nausea, no vomiting, no flank pain, no abdominal pain, no diarrhea and no constipation.  Constitutional: no fever.  Cardiovascular: no chest pain.  Respiratory: no shortness of breath.  Physical Exam Constitutional: Well nourished and well developed . No acute distress.  Pulmonary: No respiratory distress and normal respiratory rhythm and effort.  Cardiovascular: Heart rate and rhythm are normal . No peripheral edema.  Abdomen: The abdomen is soft and nontender. No masses are palpated. No CVA tenderness. No hernias are palpable. No hepatosplenomegaly noted.  Results/Data KUB done for stone f/u shows a 9x10.95mm stone in the upper  portion of the right distal ureter with no change in position since December. He has a 3-63mm calcification in the area of the right UPJ and an additional small RLP stone. There are stable stones in the left mid and lower pole with the largest in the LP at about 1.5cm. He has lumbar degenerative changes but no gas or soft tissue abnormalities.  Renal US done for renal stones and renal insufficiency. Right kidney is 9.5cm with 1.1cm cortical thickness. There is a 3.1mm echogenic focus in the RUP but no hydro or masses.  Left kidney is 11.5cm with 1.4cm cortical thickness. There is a possible 1.7 cm hilar cyst and multiple stones with a 1.0cm LMP stone and a 1.3cm LLP stone in addition two 4-59mm stones. No hydro or masses are noted.  Bladder volume is 40.35 but it is not well seen because of bowel gas.  Imp: unobstructed atrophied right kidney with and left renal stones.  Old records or history reviewed: I have reviewed extensive records from the [place] including office notes, labs and xray reports.  The following images/tracing/specimen were independently visualized: KUB, renal US and renogram reviewed.  The following clinical lab reports were reviewed: UA and recent culture reviewed. His PSA in October at the [place] was 0.162.  The following radiology reports were reviewed: Renogram report reviewed. LS spine series from December reviewed.  Assessment Assessed  1. Calculus of right ureter (N20.1) 2. Bilateral kidney stones (N20.0) 3. Atrophy of kidney (N26.1) 4. Chronic renal insufficiency (N18.9) 5. Male stress incontinence (N39.3) 6. Prostate cancer (C61) He has a 9x65mm right distal stone with renal atrophy and reduced function but no hydro at this time. The stone has been present in the current location since at least December. He is asymptomatic but has renal insufficiency with a Cr of 1.59.  He  has biochemical recurrence of prostate cancer but has not been found to have mets. He has progressive  incontinence.  He is on appropriate therapy for his UTI.  Plan Calculus of right ureter  1. Follow-up Schedule Surgery Office Follow-up Status: Hold For - Appointment  Requested for: [phone] Health Maintenance  2. NM Body Outside Images (for PACs use only); Status:Complete; [id number] 08:00AM 3. UA With REFLEX; [Do Not Release]; Status:In Progress - Specimen/Data Collected;  Done: [id number] 4. Ultrasound Body Outside Images (for PACs use only); Status:Complete; [id number] 08:00AM Prostate cancer  5. PSA; Status:In Progress - Specimen/Data Collected; [id number] I have encouraged him to stop the Meloxicam and Ibuprofen with the CRI. I am going to get him set up for cystoscopy with retrograde, right ureteroscopy with laser and stent and reviewed the risks of bleeding, infection, ureteral injury, need for a stent and secondary procedures, worsening incontinence, thrombotic events and anesthetic complications.

## 2016-05-15 NOTE — Anesthesia Preprocedure Evaluation (Addendum)
Anesthesia Evaluation  Patient identified by MRN, date of birth, ID band Patient awake    Reviewed: Allergy & Precautions, NPO status , Patient's Chart, lab work & pertinent test results  History of Anesthesia Complications (+) PONV and history of anesthetic complications  Airway Mallampati: II  TM Distance: >3 FB Neck ROM: Full    Dental  (+) Teeth Intact, Caps, Dental Advisory Given   Pulmonary former smoker,    breath sounds clear to auscultation       Cardiovascular hypertension, Pt. on medications and Pt. on home beta blockers (-) angina+ CAD, + Past MI and + Cardiac Stents (BMS, DES Cx in '04)   Rhythm:Regular Rate:Normal  '15 ETT: no ischemia or st changes; low risk nuclear study  '11 ECHO: EF 60-65%, valves Ok   Neuro/Psych negative neurological ROS     GI/Hepatic negative GI ROS, Neg liver ROS,   Endo/Other  negative endocrine ROS  Renal/GU Renal disease (stones)     Musculoskeletal  (+) Arthritis ,   Abdominal   Peds  Hematology negative hematology ROS (+)   Anesthesia Other Findings   Reproductive/Obstetrics                            Anesthesia Physical  Anesthesia Plan  ASA: III  Anesthesia Plan: General   Post-op Pain Management:    Induction: Intravenous  Airway Management Planned: LMA  Additional Equipment:   Intra-op Plan:   Post-operative Plan:   Informed Consent:   Dental advisory given  Plan Discussed with: CRNA and Surgeon  Anesthesia Plan Comments:         Anesthesia Quick Evaluation

## 2016-05-16 ENCOUNTER — Encounter (HOSPITAL_BASED_OUTPATIENT_CLINIC_OR_DEPARTMENT_OTHER)
Admission: RE | Disposition: A | Discharge status: Home / Self Care | Payer: Self-pay | Source: Ambulatory Visit | Attending: Urology

## 2016-05-16 ENCOUNTER — Ambulatory Visit (HOSPITAL_BASED_OUTPATIENT_CLINIC_OR_DEPARTMENT_OTHER): Payer: No Typology Code available for payment source | Admitting: Anesthesiology

## 2016-05-16 ENCOUNTER — Encounter (HOSPITAL_BASED_OUTPATIENT_CLINIC_OR_DEPARTMENT_OTHER): Payer: Self-pay | Admitting: *Deleted

## 2016-05-16 ENCOUNTER — Other Ambulatory Visit: Payer: Self-pay

## 2016-05-16 ENCOUNTER — Ambulatory Visit (HOSPITAL_BASED_OUTPATIENT_CLINIC_OR_DEPARTMENT_OTHER)
Admission: RE | Admit: 2016-05-16 | Discharge: 2016-05-16 | Disposition: A | Discharge status: Home / Self Care | Payer: No Typology Code available for payment source | Source: Ambulatory Visit | Attending: Urology | Admitting: Urology

## 2016-05-16 DIAGNOSIS — Z7982 Long term (current) use of aspirin: Secondary | ICD-10-CM | POA: Diagnosis not present

## 2016-05-16 DIAGNOSIS — N201 Calculus of ureter: Secondary | ICD-10-CM | POA: Diagnosis not present

## 2016-05-16 DIAGNOSIS — I252 Old myocardial infarction: Secondary | ICD-10-CM | POA: Diagnosis not present

## 2016-05-16 DIAGNOSIS — I129 Hypertensive chronic kidney disease with stage 1 through stage 4 chronic kidney disease, or unspecified chronic kidney disease: Secondary | ICD-10-CM | POA: Insufficient documentation

## 2016-05-16 DIAGNOSIS — Z87891 Personal history of nicotine dependence: Secondary | ICD-10-CM | POA: Diagnosis not present

## 2016-05-16 DIAGNOSIS — M109 Gout, unspecified: Secondary | ICD-10-CM | POA: Insufficient documentation

## 2016-05-16 DIAGNOSIS — N202 Calculus of kidney with calculus of ureter: Secondary | ICD-10-CM | POA: Diagnosis not present

## 2016-05-16 DIAGNOSIS — I2510 Atherosclerotic heart disease of native coronary artery without angina pectoris: Secondary | ICD-10-CM | POA: Diagnosis not present

## 2016-05-16 DIAGNOSIS — E7800 Pure hypercholesterolemia, unspecified: Secondary | ICD-10-CM | POA: Insufficient documentation

## 2016-05-16 DIAGNOSIS — Z8546 Personal history of malignant neoplasm of prostate: Secondary | ICD-10-CM | POA: Diagnosis not present

## 2016-05-16 DIAGNOSIS — Z79899 Other long term (current) drug therapy: Secondary | ICD-10-CM | POA: Diagnosis not present

## 2016-05-16 DIAGNOSIS — N3946 Mixed incontinence: Secondary | ICD-10-CM | POA: Insufficient documentation

## 2016-05-16 DIAGNOSIS — N390 Urinary tract infection, site not specified: Secondary | ICD-10-CM | POA: Insufficient documentation

## 2016-05-16 DIAGNOSIS — Z955 Presence of coronary angioplasty implant and graft: Secondary | ICD-10-CM | POA: Diagnosis not present

## 2016-05-16 DIAGNOSIS — N189 Chronic kidney disease, unspecified: Secondary | ICD-10-CM | POA: Diagnosis not present

## 2016-05-16 DIAGNOSIS — Z87442 Personal history of urinary calculi: Secondary | ICD-10-CM | POA: Insufficient documentation

## 2016-05-16 DIAGNOSIS — C61 Malignant neoplasm of prostate: Secondary | ICD-10-CM | POA: Insufficient documentation

## 2016-05-16 DIAGNOSIS — B958 Unspecified staphylococcus as the cause of diseases classified elsewhere: Secondary | ICD-10-CM | POA: Insufficient documentation

## 2016-05-16 HISTORY — DX: Irradiation cystitis without hematuria: N30.40

## 2016-05-16 HISTORY — PX: CYSTOSCOPY WITH RETROGRADE PYELOGRAM, URETEROSCOPY AND STENT PLACEMENT: SHX5789

## 2016-05-16 HISTORY — PX: HOLMIUM LASER APPLICATION: SHX5852

## 2016-05-16 HISTORY — DX: Personal history of urinary (tract) infections: Z87.440

## 2016-05-16 HISTORY — DX: Reserved for inherently not codable concepts without codable children: IMO0001

## 2016-05-16 LAB — POCT I-STAT, CHEM 8
BUN: 20 mg/dL (ref 6–20)
CALCIUM ION: 1.25 mmol/L (ref 1.13–1.30)
CHLORIDE: 103 mmol/L (ref 101–111)
Creatinine, Ser: 1.4 mg/dL — ABNORMAL HIGH (ref 0.61–1.24)
GLUCOSE: 118 mg/dL — AB (ref 65–99)
HCT: 49 % (ref 39.0–52.0)
HEMOGLOBIN: 16.7 g/dL (ref 13.0–17.0)
Potassium: 4 mmol/L (ref 3.5–5.1)
SODIUM: 140 mmol/L (ref 135–145)
TCO2: 24 mmol/L (ref 0–100)

## 2016-05-16 SURGERY — CYSTOURETEROSCOPY, WITH RETROGRADE PYELOGRAM AND STENT INSERTION
Anesthesia: General | Laterality: Right

## 2016-05-16 MED ORDER — BELLADONNA ALKALOIDS-OPIUM 16.2-60 MG RE SUPP
RECTAL | Status: AC
  Filled 2016-05-16: qty 1

## 2016-05-16 MED ORDER — FENTANYL CITRATE (PF) 100 MCG/2ML IJ SOLN
INTRAMUSCULAR | Status: AC
  Filled 2016-05-16: qty 2

## 2016-05-16 MED ORDER — CEFAZOLIN SODIUM-DEXTROSE 2-4 GM/100ML-% IV SOLN
INTRAVENOUS | Status: AC
  Filled 2016-05-16: qty 100

## 2016-05-16 MED ORDER — PHENYLEPHRINE 40 MCG/ML (10ML) SYRINGE FOR IV PUSH (FOR BLOOD PRESSURE SUPPORT)
PREFILLED_SYRINGE | INTRAVENOUS | Status: AC
  Filled 2016-05-16: qty 10

## 2016-05-16 MED ORDER — SODIUM CHLORIDE 0.9 % IR SOLN
Status: DC | PRN
  Administered 2016-05-16: 4000 mL via INTRAVESICAL

## 2016-05-16 MED ORDER — CEFAZOLIN SODIUM-DEXTROSE 2-4 GM/100ML-% IV SOLN
2.0000 g | INTRAVENOUS | Status: AC
Start: 2016-05-16 — End: 2016-05-16
  Administered 2016-05-16: 2 g via INTRAVENOUS
  Filled 2016-05-16: qty 100

## 2016-05-16 MED ORDER — ARTIFICIAL TEARS OP OINT
TOPICAL_OINTMENT | OPHTHALMIC | Status: AC
Start: 2016-05-16 — End: 2016-05-16
  Filled 2016-05-16: qty 3.5

## 2016-05-16 MED ORDER — DIATRIZOATE MEGLUMINE 30 % UR SOLN
URETHRAL | Status: DC | PRN
  Administered 2016-05-16: 6 mL via URETHRAL

## 2016-05-16 MED ORDER — DEXAMETHASONE SODIUM PHOSPHATE 4 MG/ML IJ SOLN
INTRAMUSCULAR | Status: DC | PRN
  Administered 2016-05-16: 10 mg via INTRAVENOUS

## 2016-05-16 MED ORDER — PROPOFOL 10 MG/ML IV BOLUS
INTRAVENOUS | Status: DC | PRN
  Administered 2016-05-16: 130 mg via INTRAVENOUS

## 2016-05-16 MED ORDER — HYDROCODONE-ACETAMINOPHEN 5-325 MG PO TABS: 1.0000 | ORAL_TABLET | Freq: Four times a day (QID) | ORAL | Status: DC | PRN

## 2016-05-16 MED ORDER — DEXAMETHASONE SODIUM PHOSPHATE 10 MG/ML IJ SOLN
INTRAMUSCULAR | Status: AC
  Filled 2016-05-16: qty 1

## 2016-05-16 MED ORDER — FENTANYL CITRATE (PF) 100 MCG/2ML IJ SOLN
INTRAMUSCULAR | Status: DC | PRN
  Administered 2016-05-16: 50 ug via INTRAVENOUS

## 2016-05-16 MED ORDER — MIDAZOLAM HCL 2 MG/2ML IJ SOLN
INTRAMUSCULAR | Status: AC
  Filled 2016-05-16: qty 2

## 2016-05-16 MED ORDER — ONDANSETRON HCL 4 MG/2ML IJ SOLN
INTRAMUSCULAR | Status: DC | PRN
  Administered 2016-05-16: 4 mg via INTRAVENOUS

## 2016-05-16 MED ORDER — PROPOFOL 10 MG/ML IV BOLUS
INTRAVENOUS | Status: AC
  Filled 2016-05-16: qty 40

## 2016-05-16 MED ORDER — LIDOCAINE HCL (CARDIAC) 20 MG/ML IV SOLN
INTRAVENOUS | Status: DC | PRN
  Administered 2016-05-16: 60 mg via INTRAVENOUS

## 2016-05-16 MED ORDER — LACTATED RINGERS IV SOLN
INTRAVENOUS | Status: DC
  Administered 2016-05-16: 08:00:00 via INTRAVENOUS
  Filled 2016-05-16: qty 1000

## 2016-05-16 MED ORDER — MIDAZOLAM HCL 5 MG/5ML IJ SOLN
INTRAMUSCULAR | Status: DC | PRN
  Administered 2016-05-16: 2 mg via INTRAVENOUS

## 2016-05-16 MED ORDER — URIBEL 118 MG PO CAPS: 1.0000 | ORAL_CAPSULE | Freq: Four times a day (QID) | ORAL | Status: DC | PRN

## 2016-05-16 MED ORDER — ONDANSETRON HCL 4 MG/2ML IJ SOLN
INTRAMUSCULAR | Status: AC
  Filled 2016-05-16: qty 2

## 2016-05-16 MED ORDER — PHENYLEPHRINE 40 MCG/ML (10ML) SYRINGE FOR IV PUSH (FOR BLOOD PRESSURE SUPPORT)
PREFILLED_SYRINGE | INTRAVENOUS | Status: DC | PRN
  Administered 2016-05-16 (×2): 80 ug via INTRAVENOUS

## 2016-05-16 SURGICAL SUPPLY — 39 items
BAG DRAIN URO-CYSTO SKYTR STRL (DRAIN) ×3 IMPLANT
BAG DRN UROCATH (DRAIN) ×1
BASKET LASER NITINOL 1.9FR (BASKET) IMPLANT
BASKET STONE 1.7 NGAGE (UROLOGICAL SUPPLIES) ×2 IMPLANT
BASKET STONE NCOMPASS (UROLOGICAL SUPPLIES) ×2 IMPLANT
BASKET ZERO TIP NITINOL 2.4FR (BASKET) IMPLANT
BSKT STON RTRVL 120 1.9FR (BASKET)
BSKT STON RTRVL ZERO TP 2.4FR (BASKET)
CANISTER SUCT LVC 12 LTR MEDI- (MISCELLANEOUS) IMPLANT
CATH URET 5FR 28IN CONE TIP (BALLOONS)
CATH URET 5FR 28IN OPEN ENDED (CATHETERS) IMPLANT
CATH URET 5FR 70CM CONE TIP (BALLOONS) IMPLANT
CLOTH BEACON ORANGE TIMEOUT ST (SAFETY) ×3 IMPLANT
ELECT REM PT RETURN 9FT ADLT (ELECTROSURGICAL)
ELECTRODE REM PT RTRN 9FT ADLT (ELECTROSURGICAL) IMPLANT
FIBER LASER FLEXIVA 1000 (UROLOGICAL SUPPLIES) IMPLANT
FIBER LASER FLEXIVA 365 (UROLOGICAL SUPPLIES) ×2 IMPLANT
FIBER LASER FLEXIVA 550 (UROLOGICAL SUPPLIES) IMPLANT
FIBER LASER TRAC TIP (UROLOGICAL SUPPLIES) IMPLANT
GLOVE SURG SS PI 8.0 STRL IVOR (GLOVE) ×3 IMPLANT
GOWN STRL REUS W/ TWL LRG LVL3 (GOWN DISPOSABLE) IMPLANT
GOWN STRL REUS W/ TWL XL LVL3 (GOWN DISPOSABLE) ×1 IMPLANT
GOWN STRL REUS W/TWL LRG LVL3 (GOWN DISPOSABLE) ×3
GOWN STRL REUS W/TWL XL LVL3 (GOWN DISPOSABLE) ×3
GUIDEWIRE 0.038 PTFE COATED (WIRE) IMPLANT
GUIDEWIRE ANG ZIPWIRE 038X150 (WIRE) IMPLANT
GUIDEWIRE STR DUAL SENSOR (WIRE) ×3 IMPLANT
IV NS IRRIG 3000ML ARTHROMATIC (IV SOLUTION) ×3 IMPLANT
KIT BALLIN UROMAX 15FX10 (LABEL) IMPLANT
KIT BALLN UROMAX 15FX4 (MISCELLANEOUS) IMPLANT
KIT BALLN UROMAX 26 75X4 (MISCELLANEOUS)
KIT ROOM TURNOVER WOR (KITS) ×3 IMPLANT
MANIFOLD NEPTUNE II (INSTRUMENTS) ×2 IMPLANT
PACK CYSTO (CUSTOM PROCEDURE TRAY) ×3 IMPLANT
SET HIGH PRES BAL DIL (LABEL)
SHEATH ACCESS URETERAL 38CM (SHEATH) ×2 IMPLANT
STENT URET 6FRX26 CONTOUR (STENTS) ×2 IMPLANT
TUBE CONNECTING 12'X1/4 (SUCTIONS)
TUBE CONNECTING 12X1/4 (SUCTIONS) IMPLANT

## 2016-05-16 NOTE — Brief Op Note (Signed)
05/16/2016  9:39 AM  PATIENT:  [name]  70 y.o. male  PRE-OPERATIVE DIAGNOSIS:  RIGHT DISTAL STONE  POST-OPERATIVE DIAGNOSIS:  RIGHT DISTAL STONE  PROCEDURE:  Procedure(s): CYSTOSCOPY WITH RIGHT RETROGRADE PYELOGRAM, URETEROSCOPY WITH LASER AND STENT PLACEMENT (Right) HOLMIUM LASER APPLICATION (Right)  SURGEON:  Surgeon(s) and Role:    * Irine Seal, MD - Primary  PHYSICIAN ASSISTANT:   ASSISTANTS: none   ANESTHESIA:   general  EBL:  Total I/O In: 800 [I.V.:800] Out: 10 [Blood:10]  BLOOD ADMINISTERED:none  DRAINS: right 6 x 26 JJ stent   LOCAL MEDICATIONS USED:  NONE  SPECIMEN:  Source of Specimen:  stone fragments  DISPOSITION OF SPECIMEN:  to patient  COUNTS:  YES  TOURNIQUET:  * No tourniquets in log *  DICTATION: .Other Dictation: Dictation Number [id number]  PLAN OF CARE: Discharge to home after PACU  PATIENT DISPOSITION:  PACU - hemodynamically stable.   Delay start of Pharmacological VTE agent (>24hrs) due to surgical blood loss or risk of bleeding: not applicable

## 2016-05-16 NOTE — Transfer of Care (Signed)
   Last Vitals:  Filed Vitals:   05/16/16 0724  BP: 121/73  Pulse: 75  Temp: 36.5 C  Resp: 16    Last Pain: There were no vitals filed for this visit.    Patients Stated Pain Goal: 6 ([id number])  Complications: Immediate Anesthesia Transfer of Care Note  Patient: [name]  Procedure(s) Performed: Procedure(s) (LRB): CYSTOSCOPY WITH RIGHT RETROGRADE PYELOGRAM, URETEROSCOPY WITH LASER AND STENT PLACEMENT (Right) HOLMIUM LASER APPLICATION (Right)  Patient Location: PACU  Anesthesia Type: General  Level of Consciousness: awake, alert  and oriented  Airway & Oxygen Therapy: Patient Spontanous Breathing and Patient connected to nasal cannula oxygen  Post-op Assessment: Report given to PACU RN and Post -op Vital signs reviewed and stable  Post vital signs: Reviewed and stable  Complications: No apparent anesthesia complications

## 2016-05-16 NOTE — Op Note (Deleted)
NAME:  [name], [name] NO.:  [id number]  MEDICAL RECORD NO.:  [id number]  LOCATION:                                 FACILITY:  PHYSICIAN:  [name], M.D.    DATE OF BIRTH:  10/09/1946  DATE OF PROCEDURE:  05/16/2016 DATE OF DISCHARGE:  05/16/2016                              OPERATIVE REPORT   PROCEDURE: 1. Cystoscopy with right retrograde pyelogram with interpretation. 2. Right ureteroscopic stone extraction with holmium laser lithotripsy     and insertion of right double-J stent.  PREOPERATIVE DIAGNOSIS:  A 9 mm right distal ureteral stone.  POSTOPERATIVE DIAGNOSIS:  A 9 mm right distal ureteral stone.  SURGEON:  [name], M.D.  ANESTHESIA:  General.  SPECIMEN:  Stone fragments.  DRAINS:  A 6 x 26 right double-J stent.  BLOOD LOSS:  Minimal.  COMPLICATIONS:  None.  INDICATIONS:  [name] is a 70 year old white male, who has a 9 mm right distal ureteral stone.  It has been present for several months. He was originally managed at the [place] and was delayed in being referred for definitive therapy.  He has been found to have less than 20% function of the right kidney on a renogram, but it was still felt that stone extraction was needed.  FINDINGS AND PROCEDURE:  He was taken to the operating room, where general anesthetic was induced.  He was placed in lithotomy position and fitted with PAS hose.  His perineum and genitalia were prepped with Betadine solution.  He was draped in usual sterile fashion.  He received 2 g of Ancef for antibiotic prophylaxis.  Cystoscopy was performed using a 21-French scope and a 30-degree lens. Examination revealed a normal urethra.  The external sphincter was intact.  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  There were some radiation changes in the trigone from his salvage radiation.  His ureteral orifices were otherwise unremarkable.  The remainder of the bladder wall had mild trabeculation  without tumor stones or inflammation.  Right ureteral retrograde pyelogram was performed with a 5-French open- end catheter and Cystografin.  The right retrograde pyelogram revealed delicate, but otherwise normal distal ureter up to stone approximately 5-6 cm proximal to the meatus. The stone was only mildly obstructing.  There were no proximal filling defects.  Once retrograde pyelography was performed, a guidewire was passed to the kidney under fluoroscopic guidance without difficulty.  The cystoscope was then removed and a 38 cm digital access sheath was used to dilate the distal ureter.  This dilation proceeded without difficulty.  The 4.5-French semi-rigid ureteroscope was then passed alongside the wire and the stone was readily visualized.  The stone was engaged with the 365 micron laser fiber set on 1 watts and 20 hertz.  The stone was broken into manageable fragments.  The fragments were then removed with a combination of a NGage basket and a NCompass basket.  Once all significant fragments were removed, final inspection revealed a little bit of sand left in the ureter.  There was some mucosal irritation, but no major injuries, but it was felt that it would be prudent to place a stent.  A 6-French 26  cm contoured double-J stent with string was then inserted over the wire to the kidney after the cystoscope was replaced over the wire.  Once the stent was in good position, the wire was removed leaving good coil in the kidney and good coil in the bladder.  The cystoscope was removed leaving the stent string exiting urethra. The cystoscope was reinserted over the wire, and the stone fragments were then evacuated from the bladder and the bladder was drained.  At this point, the cystoscope was removed.  The string was secured to the patient's penis with tape and B and O suppository was placed.  The stone fragments were collected and will be given to the patient.  He  was taken down from lithotomy position.  His anesthetic was reversed.  He was moved to recovery room in stable condition.  There were no complications.     [name], M.D.   ______________________________ [name], M.D.    JJW/MEDQ  D:  05/16/2016  T:  05/16/2016  Job:  [id number]

## 2016-05-16 NOTE — Anesthesia Postprocedure Evaluation (Signed)
Anesthesia Post Note  Patient: [name]  Procedure(s) Performed: Procedure(s) (LRB): CYSTOSCOPY WITH RIGHT RETROGRADE PYELOGRAM, URETEROSCOPY WITH LASER AND STENT PLACEMENT (Right) HOLMIUM LASER APPLICATION (Right)  Patient location during evaluation: PACU Anesthesia Type: General Level of consciousness: awake and alert Pain management: pain level controlled Vital Signs Assessment: post-procedure vital signs reviewed and stable Respiratory status: spontaneous breathing, nonlabored ventilation, respiratory function stable and patient connected to nasal cannula oxygen Cardiovascular status: blood pressure returned to baseline and stable Postop Assessment: no signs of nausea or vomiting Anesthetic complications: no    Last Vitals:  Filed Vitals:   05/16/16 0944 05/16/16 1000  BP: 141/77 119/74  Pulse: 68 60  Temp: 36.6 C   Resp: 14 12    Last Pain:  Filed Vitals:   05/16/16 1011  PainSc: 0-No pain                 Zenaida Deed

## 2016-05-16 NOTE — Discharge Instructions (Addendum)
CYSTOSCOPY HOME CARE INSTRUCTIONS  Activity: Rest for the remainder of the day.  Do not drive or operate equipment today.  You may resume normal activities in one to two days as instructed by your physician.   Meals: Drink plenty of liquids and eat light foods such as gelatin or soup this evening.  You may return to a normal meal plan tomorrow.  Return to Work: You may return to work in one to two days or as instructed by your physician.  Special Instructions / Symptoms: Call your physician if any of these symptoms occur:   -persistent or heavy bleeding  -bleeding which continues after first few urination  -large blood clots that are difficult to pass  -urine stream diminishes or stops completely  -fever equal to or higher than 101 degrees Farenheit.  -cloudy urine with a strong, foul odor  -severe pain  Females should always wipe from front to back after elimination.  You may feel some burning pain when you urinate.  This should disappear with time.  Applying moist heat to the lower abdomen or a hot tub bath may help relieve the pain. \  You may remove your stent by pulling the attached string on Sunday morning.  If you don't feel comfortable doing that, you can come to the office Monday morning to have it done.     Please bring your stone fragments to the office for your f/u visit.   Patient Signature:  ________________________________________________________  Nurse's Signature:  ________________________________________________________  Post Anesthesia Home Care Instructions  Activity: Get plenty of rest for the remainder of the day. A responsible adult should stay with you for 24 hours following the procedure.  For the next 24 hours, DO NOT: -Drive a car -Paediatric nurse -Drink alcoholic beverages -Take any medication unless instructed by your physician -Make any legal decisions or sign important papers.  Meals: Start with liquid foods such as gelatin or soup.  Progress to regular foods as tolerated. Avoid greasy, spicy, heavy foods. If nausea and/or vomiting occur, drink only clear liquids until the nausea and/or vomiting subsides. Call your physician if vomiting continues.  Special Instructions/Symptoms: Your throat may feel dry or sore from the anesthesia or the breathing tube placed in your throat during surgery. If this causes discomfort, gargle with warm salt water. The discomfort should disappear within 24 hours.  If you had a scopolamine patch placed behind your ear for the management of post- operative nausea and/or vomiting:  1. The medication in the patch is effective for 72 hours, after which it should be removed.  Wrap patch in a tissue and discard in the trash. Wash hands thoroughly with soap and water. 2. You may remove the patch earlier than 72 hours if you experience unpleasant side effects which may include dry mouth, dizziness or visual disturbances. 3. Avoid touching the patch. Wash your hands with soap and water after contact with the patch.

## 2016-05-16 NOTE — Op Note (Deleted)
NAME:  LEMAN, BOLASH NO.:  [id number]  MEDICAL RECORD NO.:  [id number]  LOCATION:                                 FACILITY:  PHYSICIAN:  Marshall Cork. Jeffie Pollock, M.D.    DATE OF BIRTH:  1946/05/30  DATE OF PROCEDURE:  05/16/2016 DATE OF DISCHARGE:  05/16/2016                              OPERATIVE REPORT   PROCEDURE: 1. Cystoscopy with right retrograde pyelogram with interpretation. 2. Right ureteroscopic stone extraction with holmium laser lithotripsy     and insertion of right double-J stent.  PREOPERATIVE DIAGNOSIS:  A 9 mm right distal ureteral stone.  POSTOPERATIVE DIAGNOSIS:  A 9 mm right distal ureteral stone.  SURGEON:  Marshall Cork. Jeffie Pollock, M.D.  ANESTHESIA:  General.  SPECIMEN:  Stone fragments.  DRAINS:  A 6 x 26 right double-J stent.  BLOOD LOSS:  Minimal.  COMPLICATIONS:  None.  INDICATIONS:  [name] is a 70 year old white male, who has a 9 mm right distal ureteral stone.  It has been present for several months. He was originally managed at the [place] and was delayed in being referred for definitive therapy.  He has been found to have less than 20% function of the right kidney on a renogram, but it was still felt that stone extraction was needed.  FINDINGS AND PROCEDURE:  He was taken to the operating room, where general anesthetic was induced.  He was placed in lithotomy position and fitted with PAS hose.  His perineum and genitalia were prepped with Betadine solution.  He was draped in usual sterile fashion.  He received 2 g of Ancef for antibiotic prophylaxis.  Cystoscopy was performed using a 21-French scope and a 30-degree lens. Examination revealed a normal urethra.  The external sphincter was intact.  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  There were some radiation changes in the trigone from his salvage radiation.  His ureteral orifices were otherwise unremarkable.  The remainder of the bladder wall had mild trabeculation  without tumor stones or inflammation.  Right ureteral retrograde pyelogram was performed with a 5-French open- end catheter and Cystografin.  The right retrograde pyelogram revealed delicate, but otherwise normal distal ureter up to stone approximately 5-6 cm proximal to the meatus. The stone was only mildly obstructing.  There were no proximal filling defects.  Once retrograde pyelography was performed, a guidewire was passed to the kidney under fluoroscopic guidance without difficulty.  The cystoscope was then removed and a 38 cm digital access sheath was used to dilate the distal ureter.  This dilation proceeded without difficulty.  The 4.5-French semi-rigid ureteroscope was then passed alongside the wire and the stone was readily visualized.  The stone was engaged with the 365 micron laser fiber set on 1 watts and 20 hertz.  The stone was broken into manageable fragments.  The fragments were then removed with a combination of a NGage basket and a NCompass basket.  Once all significant fragments were removed, final inspection revealed a little bit of sand left in the ureter.  There was some mucosal irritation, but no major injuries, but it was felt that it would be prudent to place a stent.  A 6-French 26  cm contoured double-J stent with string was then inserted over the wire to the kidney after the cystoscope was replaced over the wire.  Once the stent was in good position, the wire was removed leaving good coil in the kidney and good coil in the bladder.  The cystoscope was removed leaving the stent string exiting urethra. The cystoscope was reinserted over the wire, and the stone fragments were then evacuated from the bladder and the bladder was drained.  At this point, the cystoscope was removed.  The string was secured to the patient's penis with tape and B and O suppository was placed.  The stone fragments were collected and will be given to the patient.  He  was taken down from lithotomy position.  His anesthetic was reversed.  He was moved to recovery room in stable condition.  There were no complications.     Marshall Cork. Jeffie Pollock, M.D.   ______________________________ Marshall Cork. Jeffie Pollock, M.D.    JJW/MEDQ  D:  05/16/2016  T:  05/16/2016  Job:  [id number]

## 2016-05-16 NOTE — Op Note (Signed)
NAME:  [name], [name] NO.:  [id number]  MEDICAL RECORD NO.:  [id number]  LOCATION:                                 FACILITY:  PHYSICIAN:  Marshall Cork. Jeffie Pollock, M.D.    DATE OF BIRTH:  [birth year]  DATE OF PROCEDURE:  05/16/2016 DATE OF DISCHARGE:  05/16/2016                              OPERATIVE REPORT   PROCEDURE: 1. Cystoscopy with right retrograde pyelogram with interpretation. 2. Right ureteroscopic stone extraction with holmium laser lithotripsy     and insertion of right double-J stent.  PREOPERATIVE DIAGNOSIS:  A 9 mm right distal ureteral stone.  POSTOPERATIVE DIAGNOSIS:  A 9 mm right distal ureteral stone.  SURGEON:  Marshall Cork. Jeffie Pollock, M.D.  ANESTHESIA:  General.  SPECIMEN:  Stone fragments.  DRAINS:  A 6 x 26 right double-J stent.  BLOOD LOSS:  Minimal.  COMPLICATIONS:  None.  INDICATIONS:  [name] is a 70 year old white male, who has a 9 mm right distal ureteral stone.  It has been present for several months. He was originally managed at the [place] and was delayed in being referred for definitive therapy.  He has been found to have less than 20% function of the right kidney on a renogram, but it was still felt that stone extraction was needed.  FINDINGS AND PROCEDURE:  He was taken to the operating room, where general anesthetic was induced.  He was placed in lithotomy position and fitted with PAS hose.  His perineum and genitalia were prepped with Betadine solution.  He was draped in usual sterile fashion.  He received 2 g of Ancef for antibiotic prophylaxis.  Cystoscopy was performed using a 21-French scope and a 30-degree lens. Examination revealed a normal urethra.  The external sphincter was intact.  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  There were some radiation changes in the trigone from his salvage radiation.  His ureteral orifices were otherwise unremarkable.  The remainder of the bladder wall had mild trabeculation  without tumor stones or inflammation.  Right ureteral retrograde pyelogram was performed with a 5-French open- end catheter and Cystografin.  The right retrograde pyelogram revealed delicate, but otherwise normal distal ureter up to stone approximately 5-6 cm proximal to the meatus. The stone was only mildly obstructing.  There were no proximal filling defects.  Once retrograde pyelography was performed, a guidewire was passed to the kidney under fluoroscopic guidance without difficulty.  The cystoscope was then removed and a 38 cm digital access sheath was used to dilate the distal ureter.  This dilation proceeded without difficulty.  The 4.5-French semi-rigid ureteroscope was then passed alongside the wire and the stone was readily visualized.  The stone was engaged with the 365 micron laser fiber set on 1 watts and 20 hertz.  The stone was broken into manageable fragments.  The fragments were then removed with a combination of a NGage basket and a NCompass basket.  Once all significant fragments were removed, final inspection revealed a little bit of sand left in the ureter.  There was some mucosal irritation, but no major injuries, but it was felt that it would be prudent to place a stent.  A 6-French 26  cm contoured double-J stent with string was then inserted over the wire to the kidney after the cystoscope was replaced over the wire.  Once the stent was in good position, the wire was removed leaving good coil in the kidney and good coil in the bladder.  The cystoscope was removed leaving the stent string exiting urethra. The cystoscope was reinserted over the wire, and the stone fragments were then evacuated from the bladder and the bladder was drained.  At this point, the cystoscope was removed.  The string was secured to the patient's penis with tape and B and O suppository was placed.  The stone fragments were collected and will be given to the patient.  He  was taken down from lithotomy position.  His anesthetic was reversed.  He was moved to recovery room in stable condition.  There were no complications.     Marshall Cork. Jeffie Pollock, M.D.   ______________________________ Marshall Cork. Jeffie Pollock, M.D.    JJW/MEDQ  D:  05/16/2016  T:  05/16/2016  Job:  [id number]

## 2016-05-16 NOTE — Anesthesia Procedure Notes (Signed)
Procedure Name: LMA Insertion Date/Time: 05/16/2016 8:49 AM Performed by: Mechele Claude Pre-anesthesia Checklist: Patient identified, Emergency Drugs available, Suction available and Patient being monitored Patient Re-evaluated:Patient Re-evaluated prior to inductionOxygen Delivery Method: Circle System Utilized Preoxygenation: Pre-oxygenation with 100% oxygen Intubation Type: IV induction Ventilation: Mask ventilation without difficulty LMA: LMA inserted LMA Size: 4.0 Number of attempts: 1 Airway Equipment and Method: bite block Placement Confirmation: positive ETCO2 Tube secured with: Tape Dental Injury: Teeth and Oropharynx as per pre-operative assessment

## 2016-05-16 NOTE — Interval H&P Note (Signed)
History and Physical Interval Note:  05/16/2016 8:30 AM  [name]  has presented today for surgery, with the diagnosis of RIGHT DISTAL STONE  The various methods of treatment have been discussed with the patient and family. After consideration of risks, benefits and other options for treatment, the patient has consented to  Procedure(s): CYSTOSCOPY WITH RIGHT RETROGRADE PYELOGRAM, URETEROSCOPY WITH LASER AND STENT PLACEMENT (Right) HOLMIUM LASER APPLICATION (Right) as a surgical intervention .  The patient's history has been reviewed, patient examined, no change in status, stable for surgery.  I have reviewed the patient's chart and labs.  Questions were answered to the patient's satisfaction.     [name] J

## 2016-05-17 ENCOUNTER — Encounter (HOSPITAL_BASED_OUTPATIENT_CLINIC_OR_DEPARTMENT_OTHER): Payer: Self-pay | Admitting: Urology

## 2016-08-14 DIAGNOSIS — Z23 Encounter for immunization: Secondary | ICD-10-CM | POA: Diagnosis not present

## 2016-09-06 DIAGNOSIS — N3091 Cystitis, unspecified with hematuria: Secondary | ICD-10-CM | POA: Diagnosis not present

## 2016-09-16 DIAGNOSIS — N3091 Cystitis, unspecified with hematuria: Secondary | ICD-10-CM | POA: Diagnosis not present

## 2016-09-26 DIAGNOSIS — N390 Urinary tract infection, site not specified: Secondary | ICD-10-CM | POA: Diagnosis not present

## 2016-10-09 DIAGNOSIS — C61 Malignant neoplasm of prostate: Secondary | ICD-10-CM | POA: Diagnosis not present

## 2016-10-09 DIAGNOSIS — N200 Calculus of kidney: Secondary | ICD-10-CM | POA: Diagnosis not present

## 2016-10-09 DIAGNOSIS — N3091 Cystitis, unspecified with hematuria: Secondary | ICD-10-CM | POA: Diagnosis not present

## 2017-01-01 ENCOUNTER — Encounter: Payer: Self-pay | Admitting: Gastroenterology

## 2017-01-01 DIAGNOSIS — C61 Malignant neoplasm of prostate: Secondary | ICD-10-CM | POA: Diagnosis not present

## 2017-01-08 DIAGNOSIS — N200 Calculus of kidney: Secondary | ICD-10-CM | POA: Diagnosis not present

## 2017-01-08 DIAGNOSIS — N5201 Erectile dysfunction due to arterial insufficiency: Secondary | ICD-10-CM | POA: Diagnosis not present

## 2017-01-08 DIAGNOSIS — C61 Malignant neoplasm of prostate: Secondary | ICD-10-CM | POA: Diagnosis not present

## 2017-01-08 DIAGNOSIS — Z87440 Personal history of urinary (tract) infections: Secondary | ICD-10-CM | POA: Diagnosis not present

## 2017-01-08 DIAGNOSIS — N393 Stress incontinence (female) (male): Secondary | ICD-10-CM | POA: Diagnosis not present

## 2017-04-08 DIAGNOSIS — N200 Calculus of kidney: Secondary | ICD-10-CM | POA: Diagnosis not present

## 2017-10-14 ENCOUNTER — Other Ambulatory Visit: Payer: Self-pay | Admitting: Urology

## 2017-10-14 DIAGNOSIS — N200 Calculus of kidney: Secondary | ICD-10-CM

## 2017-10-14 DIAGNOSIS — N189 Chronic kidney disease, unspecified: Secondary | ICD-10-CM

## 2017-10-22 ENCOUNTER — Encounter (HOSPITAL_COMMUNITY)
Admission: RE | Admit: 2017-10-22 | Discharge: 2017-10-22 | Disposition: A | Discharge status: Home / Self Care | Payer: Medicare Other | Source: Ambulatory Visit | Attending: Urology | Admitting: Urology

## 2017-10-22 DIAGNOSIS — N189 Chronic kidney disease, unspecified: Secondary | ICD-10-CM | POA: Insufficient documentation

## 2017-10-22 DIAGNOSIS — N200 Calculus of kidney: Secondary | ICD-10-CM | POA: Diagnosis not present

## 2017-10-22 MED ORDER — TECHNETIUM TC 99M MERTIATIDE
5.0000 | Freq: Once | INTRAVENOUS | Status: AC | PRN
  Administered 2017-10-22: 5 via INTRAVENOUS

## 2017-10-22 MED ORDER — FUROSEMIDE 10 MG/ML IJ SOLN
47.0000 mg | Freq: Once | INTRAMUSCULAR | Status: AC
  Administered 2017-10-22: 47 mg via INTRAVENOUS

## 2017-10-22 MED ORDER — FUROSEMIDE 10 MG/ML IJ SOLN
INTRAMUSCULAR | Status: AC
  Filled 2017-10-22: qty 8

## 2017-11-08 DIAGNOSIS — J019 Acute sinusitis, unspecified: Secondary | ICD-10-CM | POA: Diagnosis not present

## 2017-12-01 ENCOUNTER — Other Ambulatory Visit: Payer: Self-pay | Admitting: Urology

## 2017-12-02 ENCOUNTER — Other Ambulatory Visit: Payer: Self-pay

## 2017-12-02 ENCOUNTER — Encounter (HOSPITAL_BASED_OUTPATIENT_CLINIC_OR_DEPARTMENT_OTHER): Payer: Self-pay | Admitting: *Deleted

## 2017-12-02 NOTE — Progress Notes (Signed)
SPOKE W/ PT VIA PHONE FOR PRE-OP INTERVIEW.  NPO AFTER MN.  ARRIVE AT [phone].  NEEDS ISTAT 8 AND EKG.

## 2017-12-03 ENCOUNTER — Encounter (HOSPITAL_COMMUNITY): Payer: Self-pay | Admitting: General Practice

## 2017-12-03 NOTE — Anesthesia Preprocedure Evaluation (Addendum)
Anesthesia Evaluation  Patient identified by MRN, date of birth, ID band Patient awake    Reviewed: Allergy & Precautions, NPO status , Patient's Chart, lab work & pertinent test results  History of Anesthesia Complications (+) PONV and history of anesthetic complications  Airway Mallampati: III  TM Distance: >3 FB Neck ROM: Full    Dental  (+) Teeth Intact, Caps, Dental Advisory Given   Pulmonary former smoker,    breath sounds clear to auscultation       Cardiovascular hypertension, Pt. on medications and Pt. on home beta blockers (-) angina+ CAD, + Past MI and + Cardiac Stents (BMS, DES Cx in '04)   Rhythm:Regular Rate:Normal  '15 ETT: no ischemia or st changes; low risk nuclear study  '11 ECHO: EF 60-65%, valves Ok   Neuro/Psych negative neurological ROS     GI/Hepatic negative GI ROS, Neg liver ROS,   Endo/Other  negative endocrine ROS  Renal/GU Renal disease (stones)     Musculoskeletal  (+) Arthritis ,   Abdominal   Peds  Hematology HLD   Anesthesia Other Findings LEFT RENAL STONE  Reproductive/Obstetrics                            Anesthesia Physical  Anesthesia Plan  ASA: III  Anesthesia Plan: General   Post-op Pain Management:    Induction: Intravenous  PONV Risk Score and Plan: 3 and Dexamethasone, Ondansetron, Treatment may vary due to age or medical condition and Midazolam  Airway Management Planned: LMA  Additional Equipment:   Intra-op Plan:   Post-operative Plan: Extubation in OR  Informed Consent: I have reviewed the patients History and Physical, chart, labs and discussed the procedure including the risks, benefits and alternatives for the proposed anesthesia with the patient or authorized representative who has indicated his/her understanding and acceptance.   Dental advisory given  Plan Discussed with: CRNA  Anesthesia Plan Comments:         Anesthesia Quick Evaluation

## 2017-12-03 NOTE — H&P (View-Only) (Signed)
CC: I have kidney stones.  HPI: [name] is a 72 year-old male established patient who is here for renal calculi.  The problem is on the left side.   10/14/17: [name] returns today in f/u to discuss his left renal stones. He has a 64mm LLP and 52mm LMP stones that have been present for many years but have grown slightly over the years. He had right ureteroscopy for a large right distal stone last year and had a renogram in 2/17 that showed only 17% function on the right. He has had no gross hematuria or flank pain. He had 20-40 RBC's on his UA last week but his UA is clear today. A renal US in 8/18 at the [facility] showed a 7.3cm right kidney that was down from 9.5cm last year. The left kidney has stones but no hydro. He is seen by the [facility] nephrology department.   10/01/17: Patient returns today for further discussion of his renal stones. He has been undergoing care at the [place]. Most recent PSA in 05/2017 was 0.148. CT and bone scan 10/2016 showed no evidence of metastatic disease. He underwent renal ultrasound on 07/04/17, which showed atrophic right kidney with no gross abnormalities or hydronephrosis. He was noted to have a approximatly 1.1 cm mid pole stone in the left and a 1.5 cm left lower pole stone. No hydronephrosis was noted. He currently denies any flank pain, gross hematuria, dysuria, nausea, or vomiting. He continues to have incontinence, which remains stable. He denies any issues with his IPP. He denies any changes to his overall health since being seen last. He takes daily Aspirin, but is on no other blood thinners.      ALLERGIES: No Allergies    MEDICATIONS: Allopurinol 300 mg tablet  Aspirin 81 mg tablet, chewable  Metoprolol Tartrate 50 mg tablet  Atorvastatin Calcium 80 mg tablet  Fish Oil 1 PO Daily  Gabapentin 600 mg tablet  Multivitamin  Tramadol Hcl 50 mg tablet  Tylenol Arthritis 650 mg tablet, extended release  Vitamin B12  Vitamin D3 1,000 unit capsule     GU PSH:  Cystoscopy Fulguration - 2016 Cystoscopy Insert Stent - 05/16/2016 Cystoscopy TURBT <2 cm - 2016 Cystoscopy Ureteroscopy - 2009 ESWL - 2014 Extensive Prostate Surgery - 2009 Insertion IPP - 2009 Repair IPP - 2014 Ureteroscopic laser litho, Right - 05/16/2016 Ureteroscopic stone removal - 05/16/2016      PSH Notes: Cystoscopy With Fulguration, Cystoscopy With Fulguration Small Lesion (5-51mm), Lithotripsy, Surg Penis Replacement Of Inflatable Penile Prosthesis, Back Surgery, Cystoscopy With Ureteroscopy Right, Back Surgery, Surg Penis Insertion Of Penile Prosthesis, Prostatectomy Retropubic Radical With Lymph Node Biopsy(S), Neck Surgery   NON-GU PSH: None   GU PMH: Prostate Cancer - 10/01/2017, (Stable), PSA remains low but detectible. I will repeat the PSA in 6 months. , - 01/08/2017, Prostate cancer, - 04/25/2016 Personal Hx Urinary Tract Infections, His UA is clear today. - 01/08/2017 Renal calculus (Stable), Left, No change in left renal stones. - 10/09/2016, Bilateral kidney stones, - 04/25/2016, Kidney stone on left side, - 2014 Hemorrhagic cystitis (with hematuria), febrile UTI, I think relatively uncomplicated except for the fever. - 09/06/2016 Ureteral calculus (Improving), Left - 05/23/2016, Calculus of right ureter, - 04/25/2016, Ureteral Stone, - 2014 Atrophy of kidney, Atrophy of kidney - 04/25/2016 Chronic Kidney Disease, Chronic renal insufficiency - 04/25/2016 Stress Incontinence, Male stress incontinence - 04/25/2016 Disorder Kidney/ureter, Unspec, Renal insufficiency - 04/17/2016 Gross hematuria, Gross hematuria - 04/17/2016, Gross hematuria, - 2016, Gross Hematuria, -  03/17/13 Urinary Tract Inf, Unspec site, Suspected urinary tract infection - 04/17/2016, Pyuria, - 03-18-15, Urinary tract infection, - 03-18-2015, Febrile urinary tract infection, - 2015/03/18 Radiation cystitis (w/o hematuria), Irradiation cystitis - 2015-03-18 Mixed incontinence, Urge and stress incontinence - Mar 18, 2015 Hemorrhagic cystitis, Acute  cystitis with hematuria - 2015/03/18 ED due to arterial insufficiency, Erectile dysfunction due to arterial insufficiency - 17-Mar-2014 Nocturia, Nocturia - 2014-03-17 Urinary Frequency, Urinary frequency - March 17, 2014 History of prostate cancer, Prostate Cancer - 03/17/13 Low back pain, Lumbago - 03-17-2013      PMH Notes:  2008-01-07 09:00:05 - Note: Acute Myocardial Infarction  2008-01-07 09:00:05 - Note: Cancer   NON-GU PMH: Encounter for general adult medical examination without abnormal findings, Encounter for preventive health examination - 04/25/2016 Muscle weakness (generalized), Muscle weakness - 2014/03/17 Other lack of coordination, Muscular incoordination - 03/17/2014 Gout, Gout - 17-Mar-2013 Personal history of other diseases of the circulatory system, History of hypertension - 03-17-2013, History of cardiac disorder, - 2013/03/17 Personal history of other endocrine, nutritional and metabolic disease, History of hypercholesterolemia - 2013-03-17    FAMILY HISTORY: Death In The Family Father - Runs In Family Death In The Family Mother - Runs In Family Heart Disease - Father   SOCIAL HISTORY: Marital Status: Married Preferred Language: English; Race: White Current Smoking Status: Patient does not smoke anymore. Has not smoked since 05/24/2003.  Drinks 3 drinks per day. Types of alcohol consumed: Beer, Liquor.  Drinks 3 caffeinated drinks per day. 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     Notes: Retired, Activities of daily living (ADL's), independent, Exercise habits, Former smoker, Tobacco Use, Occupation:, Caffeine Use, Alcohol Use, Marital History - Currently Married   REVIEW OF SYSTEMS:    GU Review Male:   Patient reports get up at night to urinate, leakage of urine, trouble starting your stream, and erection problems. Patient denies frequent urination, hard to postpone urination, burning/ pain with urination, stream starts and stops, have to strain to urinate , and penile pain.  Gastrointestinal (Upper):   Patient denies  vomiting, indigestion/ heartburn, and nausea.  Gastrointestinal (Lower):   Patient denies diarrhea and constipation.  Constitutional:   Patient denies fever, night sweats, weight loss, and fatigue.  Skin:   Patient denies skin rash/ lesion and itching.  Eyes:   Patient denies blurred vision and double vision.  Ears/ Nose/ Throat:   Patient denies sore throat and sinus problems.  Hematologic/Lymphatic:   Patient denies swollen glands and easy bruising.  Cardiovascular:   Patient denies leg swelling and chest pains.  Respiratory:   Patient denies cough and shortness of breath.  Endocrine:   Patient denies excessive thirst.  Musculoskeletal:   Patient reports back pain. Patient denies joint pain.  Neurological:   Patient denies headaches and dizziness.  Psychologic:   Patient denies depression and anxiety.   VITAL SIGNS:      10/14/2017 02:09 PM  BP 124/75 mmHg  Pulse 88 /min  Temperature 97.4 F / 36.3 C   MULTI-SYSTEM PHYSICAL EXAMINATION:    Constitutional: Well-nourished. No physical deformities. Normally developed. Good grooming.  Respiratory: No labored breathing, no use of accessory muscles. CTA  Cardiovascular: Normal temperature, RRR without murmur.     PAST DATA REVIEWED:  Source Of History:  Patient  Lab Test Review:   BMP  Urine Test Review:   Urinalysis  X-Ray Review: KUB: Reviewed Films. Discussed With Patient.  Renal Ultrasound: Reviewed Films. Discussed With Patient.     06/05/17 01/01/17 04/25/16 09/27/15 03/23/15 09/01/14  02/23/14 08/17/13  PSA  Total PSA 0.148 ng/dl 0.11 ng/dl 0.12 ng/dl 0.18  0.08  0.06  0.05  0.07    Notes:                     Cr was 1.45 with a GFR of 53 [facility].    PROCEDURES:          Urinalysis Dipstick Dipstick Cont'd  Color: Yellow Bilirubin: Neg  Appearance: Clear Ketones: Neg  Specific Gravity: 1.025 Blood: Neg  pH: 6.0 Protein: Neg  Glucose: Neg Urobilinogen: 0.2    Nitrites: Neg    Leukocyte Esterase: Neg     ASSESSMENT:      ICD-10 Details  1 GU:   Renal calculus - N20.0 He has 2 left renal stones but is concerned about leaving them in place since he had a silently obstructive stone on the right that caused renal atrophy. I am going to get a renogram to assess right renal function and if it is poor as it was in the past, I will stent him and then do left ESWL which he prefers. I have reviewed the risks of bleeding, infection, injury to the urethra, bladder and ureter, renal injury with possible life threatening bleeding, injury to adjacent organs, need for secondary procedures, thrombotic events and anesthetic and sedation complications.   2   Chronic Kidney Disease - N18.9 He has CKD 3 based on his recent labs.   3   Atrophy of kidney - N26.1 His right kidney atrophied more over the last year and is now 7.3cm on his most recent US.    PLAN:           Orders         Schedule X-Rays: Lasix Renogram - NExt available.   Return Visit/Planned Activity: Next Available Appointment - Schedule Surgery             Note: I will call him to schedule once I get the renogram back.    Renogram showed minimal function of the right kidney.   I will get him set up for cystoscopy with left ureteral stenting and then proceed with ESWL.  Risks reviewed.

## 2017-12-03 NOTE — H&P (Signed)
CC: I have kidney stones.  HPI: [name] is a 72 year-old male established patient who is here for renal calculi.  The problem is on the left side.   10/14/17: [name] returns today in f/u to discuss his left renal stones. He has a 94mm LLP and 30mm LMP stones that have been present for many years but have grown slightly over the years. He had right ureteroscopy for a large right distal stone last year and had a renogram in 2/17 that showed only 17% function on the right. He has had no gross hematuria or flank pain. He had 20-40 RBC's on his UA last week but his UA is clear today. A renal US in 8/18 at the [facility] showed a 7.3cm right kidney that was down from 9.5cm last year. The left kidney has stones but no hydro. He is seen by the [facility] nephrology department.   10/01/17: Patient returns today for further discussion of his renal stones. He has been undergoing care at the [place]. Most recent PSA in 05/2017 was 0.148. CT and bone scan 10/2016 showed no evidence of metastatic disease. He underwent renal ultrasound on 07/04/17, which showed atrophic right kidney with no gross abnormalities or hydronephrosis. He was noted to have a approximatly 1.1 cm mid pole stone in the left and a 1.5 cm left lower pole stone. No hydronephrosis was noted. He currently denies any flank pain, gross hematuria, dysuria, nausea, or vomiting. He continues to have incontinence, which remains stable. He denies any issues with his IPP. He denies any changes to his overall health since being seen last. He takes daily Aspirin, but is on no other blood thinners.      ALLERGIES: No Allergies    MEDICATIONS: Allopurinol 300 mg tablet  Aspirin 81 mg tablet, chewable  Metoprolol Tartrate 50 mg tablet  Atorvastatin Calcium 80 mg tablet  Fish Oil 1 PO Daily  Gabapentin 600 mg tablet  Multivitamin  Tramadol Hcl 50 mg tablet  Tylenol Arthritis 650 mg tablet, extended release  Vitamin B12  Vitamin D3 1,000 unit capsule     GU PSH:  Cystoscopy Fulguration - 2016 Cystoscopy Insert Stent - 05/16/2016 Cystoscopy TURBT <2 cm - 2016 Cystoscopy Ureteroscopy - 2009 ESWL - 2014 Extensive Prostate Surgery - 2009 Insertion IPP - 2009 Repair IPP - 2014 Ureteroscopic laser litho, Right - 05/16/2016 Ureteroscopic stone removal - 05/16/2016      PSH Notes: Cystoscopy With Fulguration, Cystoscopy With Fulguration Small Lesion (5-25mm), Lithotripsy, Surg Penis Replacement Of Inflatable Penile Prosthesis, Back Surgery, Cystoscopy With Ureteroscopy Right, Back Surgery, Surg Penis Insertion Of Penile Prosthesis, Prostatectomy Retropubic Radical With Lymph Node Biopsy(S), Neck Surgery   NON-GU PSH: None   GU PMH: Prostate Cancer - 10/01/2017, (Stable), PSA remains low but detectible. I will repeat the PSA in 6 months. , - 01/08/2017, Prostate cancer, - 04/25/2016 Personal Hx Urinary Tract Infections, His UA is clear today. - 01/08/2017 Renal calculus (Stable), Left, No change in left renal stones. - 10/09/2016, Bilateral kidney stones, - 04/25/2016, Kidney stone on left side, - 2014 Hemorrhagic cystitis (with hematuria), febrile UTI, I think relatively uncomplicated except for the fever. - 09/06/2016 Ureteral calculus (Improving), Left - 05/23/2016, Calculus of right ureter, - 04/25/2016, Ureteral Stone, - 2014 Atrophy of kidney, Atrophy of kidney - 04/25/2016 Chronic Kidney Disease, Chronic renal insufficiency - 04/25/2016 Stress Incontinence, Male stress incontinence - 04/25/2016 Disorder Kidney/ureter, Unspec, Renal insufficiency - 04/17/2016 Gross hematuria, Gross hematuria - 04/17/2016, Gross hematuria, - 2016, Gross Hematuria, -  03/19/2013 Urinary Tract Inf, Unspec site, Suspected urinary tract infection - 04/17/2016, Pyuria, - March 20, 2015, Urinary tract infection, - 03/20/15, Febrile urinary tract infection, - 03/20/15 Radiation cystitis (w/o hematuria), Irradiation cystitis - 2015/03/20 Mixed incontinence, Urge and stress incontinence - 03/20/2015 Hemorrhagic cystitis, Acute  cystitis with hematuria - 03/20/15 ED due to arterial insufficiency, Erectile dysfunction due to arterial insufficiency - 03-19-14 Nocturia, Nocturia - 2014-03-19 Urinary Frequency, Urinary frequency - 2014/03/19 History of prostate cancer, Prostate Cancer - 2013-03-19 Low back pain, Lumbago - 03/19/2013      PMH Notes:  2008-01-07 09:00:05 - Note: Acute Myocardial Infarction  2008-01-07 09:00:05 - Note: Cancer   NON-GU PMH: Encounter for general adult medical examination without abnormal findings, Encounter for preventive health examination - 04/25/2016 Muscle weakness (generalized), Muscle weakness - 2014-03-19 Other lack of coordination, Muscular incoordination - 03-19-2014 Gout, Gout - 2013-03-19 Personal history of other diseases of the circulatory system, History of hypertension - 03-19-2013, History of cardiac disorder, - 03-19-2013 Personal history of other endocrine, nutritional and metabolic disease, History of hypercholesterolemia - 03-19-2013    FAMILY HISTORY: Death In The Family Father - Runs In Family Death In The Family Mother - Runs In Family Heart Disease - Father   SOCIAL HISTORY: Marital Status: Married Preferred Language: English; Race: White Current Smoking Status: Patient does not smoke anymore. Has not smoked since 05/24/2003.  Drinks 3 drinks per day. Types of alcohol consumed: Beer, Liquor.  Drinks 3 caffeinated drinks per day. 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     Notes: Retired, Activities of daily living (ADL's), independent, Exercise habits, Former smoker, Tobacco Use, Occupation:, Caffeine Use, Alcohol Use, Marital History - Currently Married   REVIEW OF SYSTEMS:    GU Review Male:   Patient reports get up at night to urinate, leakage of urine, trouble starting your stream, and erection problems. Patient denies frequent urination, hard to postpone urination, burning/ pain with urination, stream starts and stops, have to strain to urinate , and penile pain.  Gastrointestinal (Upper):   Patient denies  vomiting, indigestion/ heartburn, and nausea.  Gastrointestinal (Lower):   Patient denies diarrhea and constipation.  Constitutional:   Patient denies fever, night sweats, weight loss, and fatigue.  Skin:   Patient denies skin rash/ lesion and itching.  Eyes:   Patient denies blurred vision and double vision.  Ears/ Nose/ Throat:   Patient denies sore throat and sinus problems.  Hematologic/Lymphatic:   Patient denies swollen glands and easy bruising.  Cardiovascular:   Patient denies leg swelling and chest pains.  Respiratory:   Patient denies cough and shortness of breath.  Endocrine:   Patient denies excessive thirst.  Musculoskeletal:   Patient reports back pain. Patient denies joint pain.  Neurological:   Patient denies headaches and dizziness.  Psychologic:   Patient denies depression and anxiety.   VITAL SIGNS:      10/14/2017 02:09 PM  BP 124/75 mmHg  Pulse 88 /min  Temperature 97.4 F / 36.3 C   MULTI-SYSTEM PHYSICAL EXAMINATION:    Constitutional: Well-nourished. No physical deformities. Normally developed. Good grooming.  Respiratory: No labored breathing, no use of accessory muscles. CTA  Cardiovascular: Normal temperature, RRR without murmur.     PAST DATA REVIEWED:  Source Of History:  Patient  Lab Test Review:   BMP  Urine Test Review:   Urinalysis  X-Ray Review: KUB: Reviewed Films. Discussed With Patient.  Renal Ultrasound: Reviewed Films. Discussed With Patient.     06/05/17 01/01/17 04/25/16 09/27/15 03/23/15 09/01/14  02/23/14 08/17/13  PSA  Total PSA 0.148 ng/dl 0.11 ng/dl 0.12 ng/dl 0.18  0.08  0.06  0.05  0.07    Notes:                     Cr was 1.45 with a GFR of 53 at [facility].    PROCEDURES:          Urinalysis Dipstick Dipstick Cont'd  Color: Yellow Bilirubin: Neg  Appearance: Clear Ketones: Neg  Specific Gravity: 1.025 Blood: Neg  pH: 6.0 Protein: Neg  Glucose: Neg Urobilinogen: 0.2    Nitrites: Neg    Leukocyte Esterase: Neg     ASSESSMENT:      ICD-10 Details  1 GU:   Renal calculus - N20.0 He has 2 left renal stones but is concerned about leaving them in place since he had a silently obstructive stone on the right that caused renal atrophy. I am going to get a renogram to assess right renal function and if it is poor as it was in the past, I will stent him and then do left ESWL which he prefers. I have reviewed the risks of bleeding, infection, injury to the urethra, bladder and ureter, renal injury with possible life threatening bleeding, injury to adjacent organs, need for secondary procedures, thrombotic events and anesthetic and sedation complications.   2   Chronic Kidney Disease - N18.9 He has CKD 3 based on his recent labs.   3   Atrophy of kidney - N26.1 His right kidney atrophied more over the last year and is now 7.3cm on his most recent US.    PLAN:           Orders         Schedule X-Rays: Lasix Renogram - NExt available.   Return Visit/Planned Activity: Next Available Appointment - Schedule Surgery             Note: I will call him to schedule once I get the renogram back.    Renogram showed minimal function of the right kidney.   I will get him set up for cystoscopy with left ureteral stenting and then proceed with ESWL.  Risks reviewed.

## 2017-12-04 ENCOUNTER — Other Ambulatory Visit: Payer: Self-pay

## 2017-12-04 ENCOUNTER — Encounter (HOSPITAL_BASED_OUTPATIENT_CLINIC_OR_DEPARTMENT_OTHER): Payer: Self-pay | Admitting: *Deleted

## 2017-12-04 ENCOUNTER — Encounter (HOSPITAL_BASED_OUTPATIENT_CLINIC_OR_DEPARTMENT_OTHER)
Admission: RE | Disposition: A | Discharge status: Home / Self Care | Payer: Self-pay | Source: Ambulatory Visit | Attending: Urology

## 2017-12-04 ENCOUNTER — Ambulatory Visit (HOSPITAL_BASED_OUTPATIENT_CLINIC_OR_DEPARTMENT_OTHER): Payer: No Typology Code available for payment source | Admitting: Anesthesiology

## 2017-12-04 ENCOUNTER — Ambulatory Visit (HOSPITAL_BASED_OUTPATIENT_CLINIC_OR_DEPARTMENT_OTHER)
Admission: RE | Admit: 2017-12-04 | Discharge: 2017-12-04 | Disposition: A | Discharge status: Home / Self Care | Payer: No Typology Code available for payment source | Source: Ambulatory Visit | Attending: Urology | Admitting: Urology

## 2017-12-04 DIAGNOSIS — Z7982 Long term (current) use of aspirin: Secondary | ICD-10-CM | POA: Diagnosis not present

## 2017-12-04 DIAGNOSIS — Z79899 Other long term (current) drug therapy: Secondary | ICD-10-CM | POA: Diagnosis not present

## 2017-12-04 DIAGNOSIS — E785 Hyperlipidemia, unspecified: Secondary | ICD-10-CM | POA: Diagnosis not present

## 2017-12-04 DIAGNOSIS — Z8249 Family history of ischemic heart disease and other diseases of the circulatory system: Secondary | ICD-10-CM | POA: Insufficient documentation

## 2017-12-04 DIAGNOSIS — Z8546 Personal history of malignant neoplasm of prostate: Secondary | ICD-10-CM | POA: Insufficient documentation

## 2017-12-04 DIAGNOSIS — M1990 Unspecified osteoarthritis, unspecified site: Secondary | ICD-10-CM | POA: Diagnosis not present

## 2017-12-04 DIAGNOSIS — R32 Unspecified urinary incontinence: Secondary | ICD-10-CM | POA: Insufficient documentation

## 2017-12-04 DIAGNOSIS — Z87891 Personal history of nicotine dependence: Secondary | ICD-10-CM | POA: Insufficient documentation

## 2017-12-04 DIAGNOSIS — Z955 Presence of coronary angioplasty implant and graft: Secondary | ICD-10-CM | POA: Insufficient documentation

## 2017-12-04 DIAGNOSIS — I2510 Atherosclerotic heart disease of native coronary artery without angina pectoris: Secondary | ICD-10-CM | POA: Diagnosis not present

## 2017-12-04 DIAGNOSIS — N183 Chronic kidney disease, stage 3 (moderate): Secondary | ICD-10-CM | POA: Insufficient documentation

## 2017-12-04 DIAGNOSIS — N200 Calculus of kidney: Secondary | ICD-10-CM | POA: Diagnosis present

## 2017-12-04 DIAGNOSIS — I129 Hypertensive chronic kidney disease with stage 1 through stage 4 chronic kidney disease, or unspecified chronic kidney disease: Secondary | ICD-10-CM | POA: Insufficient documentation

## 2017-12-04 DIAGNOSIS — I252 Old myocardial infarction: Secondary | ICD-10-CM | POA: Diagnosis not present

## 2017-12-04 HISTORY — DX: Chronic kidney disease, stage 3 unspecified: N18.30

## 2017-12-04 HISTORY — PX: CYSTOSCOPY WITH STENT PLACEMENT: SHX5790

## 2017-12-04 HISTORY — DX: Atrophy of kidney (terminal): N26.1

## 2017-12-04 HISTORY — DX: Personal history of malignant neoplasm of bladder: Z85.51

## 2017-12-04 HISTORY — DX: Chronic kidney disease, stage 3 (moderate): N18.3

## 2017-12-04 LAB — POCT I-STAT, CHEM 8
BUN: 21 mg/dL — ABNORMAL HIGH (ref 6–20)
CALCIUM ION: 1.24 mmol/L (ref 1.15–1.40)
CREATININE: 1.3 mg/dL — AB (ref 0.61–1.24)
Chloride: 103 mmol/L (ref 101–111)
GLUCOSE: 119 mg/dL — AB (ref 65–99)
HEMATOCRIT: 46 % (ref 39.0–52.0)
HEMOGLOBIN: 15.6 g/dL (ref 13.0–17.0)
Potassium: 4 mmol/L (ref 3.5–5.1)
Sodium: 141 mmol/L (ref 135–145)
TCO2: 26 mmol/L (ref 22–32)

## 2017-12-04 SURGERY — CYSTOSCOPY, WITH STENT INSERTION
Anesthesia: General | Site: Renal | Laterality: Left

## 2017-12-04 MED ORDER — ONDANSETRON HCL 4 MG/2ML IJ SOLN
INTRAMUSCULAR | Status: DC | PRN
  Administered 2017-12-04: 4 mg via INTRAVENOUS

## 2017-12-04 MED ORDER — TRAMADOL HCL 50 MG PO TABS: 50.0000 mg | ORAL_TABLET | Freq: Four times a day (QID) | ORAL | 0 refills | Status: DC | PRN

## 2017-12-04 MED ORDER — FENTANYL CITRATE (PF) 100 MCG/2ML IJ SOLN
INTRAMUSCULAR | Status: DC | PRN
  Administered 2017-12-04: 50 ug via INTRAVENOUS

## 2017-12-04 MED ORDER — SODIUM CHLORIDE 0.9 % IV SOLN
250.0000 mL | INTRAVENOUS | Status: DC | PRN
  Filled 2017-12-04: qty 250

## 2017-12-04 MED ORDER — PROPOFOL 10 MG/ML IV BOLUS
INTRAVENOUS | Status: AC
  Filled 2017-12-04: qty 40

## 2017-12-04 MED ORDER — LIDOCAINE 2% (20 MG/ML) 5 ML SYRINGE
INTRAMUSCULAR | Status: AC
  Filled 2017-12-04: qty 5

## 2017-12-04 MED ORDER — CEFAZOLIN SODIUM-DEXTROSE 2-4 GM/100ML-% IV SOLN
2.0000 g | INTRAVENOUS | Status: AC
  Administered 2017-12-04: 2 g via INTRAVENOUS
  Filled 2017-12-04: qty 100

## 2017-12-04 MED ORDER — SODIUM CHLORIDE 0.9 % IR SOLN
Status: DC | PRN
  Administered 2017-12-04: 3000 mL

## 2017-12-04 MED ORDER — SODIUM CHLORIDE 0.9% FLUSH
3.0000 mL | Freq: Two times a day (BID) | INTRAVENOUS | Status: DC
  Filled 2017-12-04: qty 3

## 2017-12-04 MED ORDER — DEXAMETHASONE SODIUM PHOSPHATE 10 MG/ML IJ SOLN
INTRAMUSCULAR | Status: AC
  Filled 2017-12-04: qty 1

## 2017-12-04 MED ORDER — HYDROMORPHONE HCL 1 MG/ML IJ SOLN
0.2500 mg | INTRAMUSCULAR | Status: DC | PRN
  Filled 2017-12-04: qty 0.5

## 2017-12-04 MED ORDER — ONDANSETRON HCL 4 MG/2ML IJ SOLN
INTRAMUSCULAR | Status: AC
  Filled 2017-12-04: qty 2

## 2017-12-04 MED ORDER — MIDAZOLAM HCL 2 MG/2ML IJ SOLN
INTRAMUSCULAR | Status: AC
  Filled 2017-12-04: qty 2

## 2017-12-04 MED ORDER — MIDAZOLAM HCL 5 MG/5ML IJ SOLN
INTRAMUSCULAR | Status: DC | PRN
  Administered 2017-12-04: 1 mg via INTRAVENOUS

## 2017-12-04 MED ORDER — OXYCODONE HCL 5 MG/5ML PO SOLN
5.0000 mg | Freq: Once | ORAL | Status: DC | PRN
  Filled 2017-12-04: qty 5

## 2017-12-04 MED ORDER — ACETAMINOPHEN 325 MG PO TABS
650.0000 mg | ORAL_TABLET | ORAL | Status: DC | PRN
  Filled 2017-12-04: qty 2

## 2017-12-04 MED ORDER — MORPHINE SULFATE (PF) 2 MG/ML IV SOLN
2.0000 mg | INTRAVENOUS | Status: DC | PRN
  Filled 2017-12-04: qty 1

## 2017-12-04 MED ORDER — SODIUM CHLORIDE 0.9% FLUSH
3.0000 mL | INTRAVENOUS | Status: DC | PRN
  Filled 2017-12-04: qty 3

## 2017-12-04 MED ORDER — FENTANYL CITRATE (PF) 100 MCG/2ML IJ SOLN
INTRAMUSCULAR | Status: AC
  Filled 2017-12-04: qty 2

## 2017-12-04 MED ORDER — CEFAZOLIN SODIUM-DEXTROSE 2-4 GM/100ML-% IV SOLN
INTRAVENOUS | Status: AC
  Filled 2017-12-04: qty 100

## 2017-12-04 MED ORDER — WHITE PETROLATUM EX OINT
TOPICAL_OINTMENT | CUTANEOUS | Status: AC
  Filled 2017-12-04: qty 5

## 2017-12-04 MED ORDER — LIDOCAINE 2% (20 MG/ML) 5 ML SYRINGE
INTRAMUSCULAR | Status: DC | PRN
  Administered 2017-12-04: 100 mg via INTRAVENOUS

## 2017-12-04 MED ORDER — PROPOFOL 10 MG/ML IV BOLUS
INTRAVENOUS | Status: DC | PRN
  Administered 2017-12-04: 150 mg via INTRAVENOUS

## 2017-12-04 MED ORDER — SODIUM CHLORIDE 0.9 % IV SOLN
INTRAVENOUS | Status: DC
  Administered 2017-12-04: 08:00:00 via INTRAVENOUS
  Filled 2017-12-04: qty 1000

## 2017-12-04 MED ORDER — DEXAMETHASONE SODIUM PHOSPHATE 4 MG/ML IJ SOLN
INTRAMUSCULAR | Status: DC | PRN
  Administered 2017-12-04: 10 mg via INTRAVENOUS

## 2017-12-04 MED ORDER — OXYCODONE HCL 5 MG PO TABS
5.0000 mg | ORAL_TABLET | Freq: Once | ORAL | Status: DC | PRN
  Filled 2017-12-04: qty 1

## 2017-12-04 MED ORDER — ACETAMINOPHEN 650 MG RE SUPP
650.0000 mg | RECTAL | Status: DC | PRN
  Filled 2017-12-04: qty 1

## 2017-12-04 SURGICAL SUPPLY — 23 items
BAG DRAIN URO-CYSTO SKYTR STRL (DRAIN) ×2 IMPLANT
BAG DRN UROCATH (DRAIN) ×1
CATH URET 5FR 28IN CONE TIP (BALLOONS)
CATH URET 5FR 28IN OPEN ENDED (CATHETERS) ×2 IMPLANT
CATH URET 5FR 70CM CONE TIP (BALLOONS) IMPLANT
CLOTH BEACON ORANGE TIMEOUT ST (SAFETY) ×2 IMPLANT
GLOVE BIOGEL M SZ8.5 STRL (GLOVE) ×1 IMPLANT
GLOVE BIOGEL PI IND STRL 8.5 (GLOVE) IMPLANT
GLOVE BIOGEL PI INDICATOR 8.5 (GLOVE) ×1
GLOVE SURG SS PI 8.0 STRL IVOR (GLOVE) ×2 IMPLANT
GOWN STRL REUS W/ TWL LRG LVL3 (GOWN DISPOSABLE) ×1 IMPLANT
GOWN STRL REUS W/ TWL XL LVL3 (GOWN DISPOSABLE) ×1 IMPLANT
GOWN STRL REUS W/TWL LRG LVL3 (GOWN DISPOSABLE)
GOWN STRL REUS W/TWL XL LVL3 (GOWN DISPOSABLE) ×2 IMPLANT
GUIDEWIRE 0.038 PTFE COATED (WIRE) IMPLANT
GUIDEWIRE ANG ZIPWIRE 038X150 (WIRE) IMPLANT
GUIDEWIRE STR DUAL SENSOR (WIRE) ×2 IMPLANT
KIT RM TURNOVER CYSTO AR (KITS) ×2 IMPLANT
MANIFOLD NEPTUNE II (INSTRUMENTS) ×1 IMPLANT
NS IRRIG 500ML POUR BTL (IV SOLUTION) IMPLANT
PACK CYSTO (CUSTOM PROCEDURE TRAY) ×2 IMPLANT
STENT URET 6FRX26 CONTOUR (STENTS) ×1 IMPLANT
TUBE CONNECTING 12X1/4 (SUCTIONS) IMPLANT

## 2017-12-04 NOTE — Transfer of Care (Signed)
  Last Vitals:  Vitals:   12/04/17 0658  BP: 133/87  Pulse: 73  Resp: 16  Temp: (!) 36.4 C  SpO2: 97%    Last Pain:  Vitals:   12/04/17 0658  TempSrc: Oral      Patients Stated Pain Goal: 5 (12/04/17 0813)  Immediate Anesthesia Transfer of Care Note  Patient: [name]  Procedure(s) Performed: Procedure(s) (LRB): CYSTOSCOPY WITH STENT PLACEMENT (Left)  Patient Location: PACU  Anesthesia Type: General  Level of Consciousness: awake, alert  and oriented  Airway & Oxygen Therapy: Patient Spontanous Breathing and Patient connected to nasal cannula oxygen  Post-op Assessment: Report given to PACU RN and Post -op Vital signs reviewed and stable  Post vital signs: Reviewed and stable  Complications: No apparent anesthesia complications

## 2017-12-04 NOTE — Op Note (Signed)
Procedure: Cystoscopy with insertion of left double-J stent.  Preop diagnosis: Left renal stone.  Postop diagnosis: Same.  Surgeon: Dr. Irine Seal.  Anesthesia: General.  Drain: 6 French by 26 cm contour double-J stent.  Specimen: None.  EBL: None.  Complications: None.  Indications: Mr. Sarver is a 72 year old white male with a history of urolithiasis.  He has a left renal stone that is approximately 15 mm and has been enlarging in size over the last few years.  His right kidney is nonfunctional.  It was felt that insertion of the left double-J stent was indicated prior to lithotripsy to protect his remaining functional renal unit.  Procedure: He was taken to the operating room where he was given 2 g of Ancef.  A general anesthetic was induced.  He was placed in lithotomy position and fitted with PAS hose.  His perineum and genitalia were prepped with Betadine solution and he was draped in usual sterile fashion.  Cystoscopy was performed using a 23 [place] scope and 30 degree lens.  Examination revealed a normal urethra.  The external sphincter was intact.  His prostate is been surgically removed.  The bladder wall had mild to moderate trabeculation with extensive radiation changes primarily in the trigone and posterior wall.  The ureteral orifices were unremarkable.  After a thorough cystoscopic inspection, the left ureteral orifice was cannulated with a 5 [place] open end catheter.  A Glidewire was then placed to the kidney under fluoroscopic guidance.  A 6 French 26 cm contour double-J stent was then passed over the wire to the kidney under fluoroscopic guidance.  The wire was removed leaving good coil in the kidney and a good coil in the bladder.  The bladder was drained and the cystoscope was removed.  He was taken down from lithotomy position, his anesthetic was reversed and he was moved to recovery room in stable condition.  There were no complications.

## 2017-12-04 NOTE — Anesthesia Procedure Notes (Signed)
Procedure Name: LMA Insertion Date/Time: 12/04/2017 9:24 AM Performed by: Murvin Natal, MD Pre-anesthesia Checklist: Patient identified, Emergency Drugs available, Suction available and Patient being monitored Patient Re-evaluated:Patient Re-evaluated prior to induction Oxygen Delivery Method: Circle system utilized Preoxygenation: Pre-oxygenation with 100% oxygen Induction Type: IV induction Ventilation: Mask ventilation without difficulty LMA: LMA inserted LMA Size: 4.0 Number of attempts: 1 Airway Equipment and Method: Bite block Placement Confirmation: positive ETCO2 Tube secured with: Tape Dental Injury: Teeth and Oropharynx as per pre-operative assessment

## 2017-12-04 NOTE — Anesthesia Postprocedure Evaluation (Signed)
Anesthesia Post Note  Patient: ONIEL MELESKI  Procedure(s) Performed: CYSTOSCOPY WITH STENT PLACEMENT (Left Renal)     Patient location during evaluation: PACU Anesthesia Type: General Level of consciousness: awake and alert Pain management: pain level controlled Vital Signs Assessment: post-procedure vital signs reviewed and stable Respiratory status: spontaneous breathing, nonlabored ventilation, respiratory function stable and patient connected to nasal cannula oxygen Cardiovascular status: blood pressure returned to baseline and stable Postop Assessment: no apparent nausea or vomiting Anesthetic complications: no    Last Vitals:  Vitals:   12/04/17 1015 12/04/17 1110  BP: 136/85 (!) 153/84  Pulse: 63 64  Resp: 14 14  Temp:  36.5 C  SpO2: 95% 99%    Last Pain:  Vitals:   12/04/17 0658  TempSrc: Oral                 Ryan P Ellender

## 2017-12-04 NOTE — Interval H&P Note (Signed)
History and Physical Interval Note:  12/04/2017 9:01 AM  [name]  has presented today for surgery, with the diagnosis of LEFT RENAL STONE  The various methods of treatment have been discussed with the patient and family. After consideration of risks, benefits and other options for treatment, the patient has consented to  Procedure(s): [place] (Left) as a surgical intervention .  The patient's history has been reviewed, patient examined, no change in status, stable for surgery.  I have reviewed the patient's chart and labs.  Questions were answered to the patient's satisfaction.     Irine Seal

## 2017-12-04 NOTE — Discharge Instructions (Signed)
Ureteral Stent Implantation, Care After °Refer to this sheet in the next few weeks. These instructions provide you with information about caring for yourself after your procedure. Your health care provider may also give you more specific instructions. Your treatment has been planned according to current medical practices, but problems sometimes occur. Call your health care provider if you have any problems or questions after your procedure. °What can I expect after the procedure? °After the procedure, it is common to have: °· Nausea. °· Mild pain when you urinate. You may feel this pain in your lower back or lower abdomen. Pain should stop within a few minutes after you urinate. This may last for up to 1 week. °· A small amount of blood in your urine for several days. ° °Follow these instructions at home: ° °Medicines °· Take over-the-counter and prescription medicines only as told by your health care provider. °· If you were prescribed an antibiotic medicine, take it as told by your health care provider. Do not stop taking the antibiotic even if you start to feel better. °· Do not drive for 24 hours if you received a sedative. °· Do not drive or operate heavy machinery while taking prescription pain medicines. °Activity °· Return to your normal activities as told by your health care provider. Ask your health care provider what activities are safe for you. °· Do not lift anything that is heavier than 10 lb (4.5 kg). Follow this limit for 1 week after your procedure, or for as long as told by your health care provider. °General instructions °· Watch for any blood in your urine. Call your health care provider if the amount of blood in your urine increases. °· If you have a catheter: °? Follow instructions from your health care provider about taking care of your catheter and collection bag. °? Do not take baths, swim, or use a hot tub until your health care provider approves. °· Drink enough fluid to keep your urine  clear or pale yellow. °· Keep all follow-up visits as told by your health care provider. This is important. °Contact a health care provider if: °· You have pain that gets worse or does not get better with medicine, especially pain when you urinate. °· You have difficulty urinating. °· You feel nauseous or you vomit repeatedly during a period of more than 2 days after the procedure. °Get help right away if: °· Your urine is dark red or has blood clots in it. °· You are leaking urine (have incontinence). °· The end of the stent comes out of your urethra. °· You cannot urinate. °· You have sudden, sharp, or severe pain in your abdomen or lower back. °· You have a fever. °This information is not intended to replace advice given to you by your health care provider. Make sure you discuss any questions you have with your health care provider. °Document Released: 07/14/2013 Document Revised: 04/18/2016 Document Reviewed: 05/26/2015 °Elsevier Interactive Patient Education © 2018 [employer] ° ° °Post Anesthesia Home Care Instructions ° °Activity: °Get plenty of rest for the remainder of the day. A responsible individual must stay with you for 24 hours following the procedure.  °For the next 24 hours, DO NOT: °-Drive a car °-Operate machinery °-Drink alcoholic beverages °-Take any medication unless instructed by your physician °-Make any legal decisions or sign important papers. ° °Meals: °Start with liquid foods such as gelatin or soup. Progress to regular foods as tolerated. Avoid greasy, spicy, heavy foods. If nausea   and/or vomiting occur, drink only clear liquids until the nausea and/or vomiting subsides. Call your physician if vomiting continues. ° °Special Instructions/Symptoms: °Your throat may feel dry or sore from the anesthesia or the breathing tube placed in your throat during surgery. If this causes discomfort, gargle with warm salt water. The discomfort should disappear within 24 hours. ° °If you had a  scopolamine patch placed behind your ear for the management of post- operative nausea and/or vomiting: ° °1. The medication in the patch is effective for 72 hours, after which it should be removed.  Wrap patch in a tissue and discard in the trash. Wash hands thoroughly with soap and water. °2. You may remove the patch earlier than 72 hours if you experience unpleasant side effects which may include dry mouth, dizziness or visual disturbances. °3. Avoid touching the patch. Wash your hands with soap and water after contact with the patch. °  ° °

## 2017-12-05 ENCOUNTER — Encounter (HOSPITAL_BASED_OUTPATIENT_CLINIC_OR_DEPARTMENT_OTHER): Payer: Self-pay | Admitting: Urology

## 2017-12-07 NOTE — H&P (Signed)
H&P  Chief Complaint: Left kidney stone  History of Present Illness: 72 year old male w/ history of urolithiasis. He  has a poorly functioning right kidney due to silent obstruction. He has 2 left renal calculi--a 10 mm interpolar stone and a 15 mm lower pole stone. He has had a left J2 stent placed 1/10 to limit the risk of ARF due to obstruction w/ stone passage. He presents now for the first stage of a planned multistage ESL regimen.  Past Medical History:  Diagnosis Date  . Atrophy of right kidney   . CKD (chronic kidney disease), stage III ([place])   . CORONARY ARTERY DISEASE CARDIOLOGIST--  DR WALL AND DR Angelena Form----   currently under [facility] in [place]   ETT  05-06-2014,  no ischemia or st changes;  low risk nuclear study 04-23-2011--- a. s/p post wall MI  2004 => overlapping stents in the CFX (one Zeta stent and one Taxus DES);  b. [place] => LHC 2/11: Normal LM, normal LAD, normal Dx, CFX 80% ISR, PDA 30-40%, EF 55%. PCI: Cutting balloon angioplasty to the CFX ISR;  c.  Lex MV 5/12: inf and IL defect c/w prior MI, no ischemia, EF 54%  . Cystitis, radiation    2013 following salvage therapy for recurrent prostate cancer post prostatectomy   . DDD (degenerative disc disease), lumbar   . Diverticulosis of colon   . GOUT 06/14/2008  . H/O agent Orange exposure   . History of bladder cancer 2016   urologsit-  dr Jeffie Pollock  . History of kidney stones   . History of myocardial infarction    04-29-2003  -- posterior and lateral wall MI  s/p  stent x2 to  left mid CFX  . History of recurrent UTIs    chronic  . Hyperlipidemia   . Hypertension   . Nephrolithiasis    bilateral  . Nocturia   . Normal exercise tolerance test results    05-06-2014  Negative for ischemia GXT with the pt exercising to a 10 met work load and 89% APMHR without new diagnotic changes of ischemia  . OA (osteoarthritis)    neck and shoulders  . Organic impotence    arterial insuff.  [name] Kitchen PONV (postoperative  nausea and vomiting)   . Recurrent prostate adenocarcinoma [facility]) urologist--  dr Irine Seal---- per pt last PSA 0.12 as of 12-02-2017   first dx  2001,  T2b  N0  M0,  Gleason 6--  s/p  radical prostatectomy/   recurrent 11/ 2012  s/p salvage radiation   . Rosacea   . S/P coronary artery stent placement    BMS x1  and DES x1  to mid left CFX  04-29-2003  . S/P radiation therapy 10/02/11 - 11/25/11   Central Lower Pelvis: 1324 cGy/38 Fractions--  for recurrent prostate cancer  . Spinal stenosis   . SUI (stress urinary incontinence), male   . Wears glasses     Past Surgical History:  Procedure Laterality Date  . ANTERIOR CERVICAL DECOMP/DISCECTOMY FUSION  2001   w/ removal bone spur--  1 level  . BONE CYST EXCISION  2012   LUMBAR  . CARDIOVASCULAR STRESS TEST  04-23-2011  dr Angelena Form   Low risk nuclear study/ fixed mixed basal inferior and inferolateral perfusion defect with wall motion abnormality that suggest prior MI, no significant ishemia/  ef 54%  . COLONOSCOPY  03-04-2007  . CORONARY ANGIOPLASTY  01-22-2010  dr Darnell Level brodie   successfully cutting  angioplasty to in-stent restenosis  mCFX  . CORONARY ANGIOPLASTY WITH STENT PLACEMENT  04-29-2003  dr Lyndel Safe   BMS x1 and DES x1 to mid left CFX for total occlusion 99%/  nonobstructive dLAD 60% and pLAD30%,  pRCA 30%,  lateral and mid inferior akinesis,  moderate irregularities LM,  D2 and D3 40%,  ef 50%  . CYSTO W/ PLACEMENT RIGHT URETERAL STENT  07-19-2004  . CYSTOSCOPY N/A 01/03/2015   Procedure: CYSTOSCOPY WITH FULGURATION OF BLEEDERS;  Surgeon: Malka So, MD;  Location: [facility];  Service: Urology;  Laterality: N/A;  . CYSTOSCOPY WITH BIOPSY N/A 12/01/2014   Procedure: CYSTOSCOPY WITH BIOPSY/FULGURATION;  Surgeon: Malka So, MD;  Location: [facility];  Service: Urology;  Laterality: N/A;  . CYSTOSCOPY WITH RETROGRADE PYELOGRAM, URETEROSCOPY AND STENT PLACEMENT Right 05/16/2016   Procedure:  CYSTOSCOPY WITH RIGHT RETROGRADE PYELOGRAM, URETEROSCOPY WITH LASER AND STENT PLACEMENT;  Surgeon: Irine Seal, MD;  Location: [facility];  Service: Urology;  Laterality: Right;  . CYSTOSCOPY WITH STENT PLACEMENT Left 12/04/2017   Procedure: CYSTOSCOPY WITH STENT PLACEMENT;  Surgeon: Irine Seal, MD;  [facility];  Service: Urology;  Laterality: Left;  . EXTRACORPOREAL SHOCK WAVE LITHOTRIPSY Left 09-16-2013  . HOLMIUM LASER APPLICATION Right 2/99/3716   Procedure: HOLMIUM LASER APPLICATION;  Surgeon: Irine Seal, MD;  Location: [facility];  Service: Urology;  Laterality: Right;  . INGUINAL HERNIA REPAIR Left 11-03-2003  . [place] SURGERY  1995  . LUMBAR LAMINECTOMY  04-25-2011  . LUMBAR LAMINECTOMY/DECOMPRESSION MICRODISCECTOMY Right 11/29/2015   Procedure:  DECOMPRESSION LUMBAR LAMINECTOMY L4-L5 MICRODISCECTOMY L4-L5 LEFT    EXCISION SENOVAL CYST LUMBAR 4-5 RIGHT  PARICAL FACETECTOMY LUMBAR 4-5 RIGHT ;  Surgeon: Latanya Maudlin, MD;  Location: [facility];  Service: Orthopedics;  Laterality: Right;  . PENILE PROSTHESIS IMPLANT N/A 02/16/2013   Procedure: REPLACEMENT OF AMS PENILE PROTHESIS INFLATABLE;  Surgeon: Malka So, MD;  Location: [facility];  Service: Urology;  Laterality: N/A;  . PENILE PROSTHESIS PLACEMENT  2003  . RADICAL PROSTATECTOMY W/ BILATERAL PELVIC LYMPHADENECTOMY  08-07-2000  . RIGHT URETEROSCOPIC LASER LITHOTRIPSY STONE EXTRACTION AND STENT  07-26-2004  . TRANSTHORACIC ECHOCARDIOGRAM  01-21-2010   mild LVH/  ef 60-65%/  trivial TR    Home Medications:  Allergies as of 12/07/2017   No Known Allergies     Medication List    Notice   Cannot display discharge medications because the patient has not yet been admitted.     Allergies: No Known Allergies  Family History  Problem Relation Age of Onset  . Hypertension Other   . Heart attack Father     Social History:  reports that he quit smoking about 15 years ago. His  smoking use included cigarettes. He has a 60.00 pack-year smoking history. he has never used smokeless tobacco. He reports that he drinks about 12.6 oz of alcohol per week. He reports that he does not use drugs.  ROS: A complete review of systems was performed.  All systems are negative except for pertinent findings as noted.  Physical Exam:  Vital signs in last 24 hours:   Constitutional:  Alert and oriented, No acute distress Cardiovascular: Regular rate and rhythm, No JVD Respiratory: Normal respiratory effort, Lungs clear bilaterally GI: Abdomen is soft, nontender, nondistended, no abdominal masses Neurologic: Grossly intact, no focal deficits Psychiatric: Normal mood and affect  Laboratory Data:  No results for input(s): WBC, HGB, HCT, PLT in  the last 72 hours.  No results for input(s): NA, K, CL, GLUCOSE, BUN, CALCIUM, CREATININE in the last 72 hours.  Invalid input(s): CO3   No results found for this or any previous visit (from the past 24 hour(s)). No results found for this or any previous visit (from the past 240 hour(s)).  Renal Function: Recent Labs    12/04/17 0748  CREATININE 1.30*   Estimated Creatinine Clearance: 61.4 mL/min (A) (by C-G formula based on SCr of 1.3 mg/dL (H)).  Radiologic Imaging: No results found.  Impression/Assessment:  Left renal calculi--10 mm interpolar ad 15 mm lower pole  Plan:  1st stage of a planned multistage ESL

## 2017-12-08 ENCOUNTER — Encounter (HOSPITAL_COMMUNITY)
Admission: RE | Disposition: A | Discharge status: Home / Self Care | Payer: Self-pay | Source: Ambulatory Visit | Attending: Urology

## 2017-12-08 ENCOUNTER — Ambulatory Visit (HOSPITAL_COMMUNITY): Payer: No Typology Code available for payment source

## 2017-12-08 ENCOUNTER — Encounter (HOSPITAL_COMMUNITY): Payer: Self-pay | Admitting: General Practice

## 2017-12-08 ENCOUNTER — Ambulatory Visit (HOSPITAL_COMMUNITY)
Admission: RE | Admit: 2017-12-08 | Discharge: 2017-12-08 | Disposition: A | Discharge status: Home / Self Care | Payer: No Typology Code available for payment source | Source: Ambulatory Visit | Attending: Urology | Admitting: Urology

## 2017-12-08 DIAGNOSIS — I129 Hypertensive chronic kidney disease with stage 1 through stage 4 chronic kidney disease, or unspecified chronic kidney disease: Secondary | ICD-10-CM | POA: Diagnosis not present

## 2017-12-08 DIAGNOSIS — Z7982 Long term (current) use of aspirin: Secondary | ICD-10-CM | POA: Insufficient documentation

## 2017-12-08 DIAGNOSIS — I252 Old myocardial infarction: Secondary | ICD-10-CM | POA: Insufficient documentation

## 2017-12-08 DIAGNOSIS — N183 Chronic kidney disease, stage 3 (moderate): Secondary | ICD-10-CM | POA: Diagnosis not present

## 2017-12-08 DIAGNOSIS — N200 Calculus of kidney: Secondary | ICD-10-CM | POA: Insufficient documentation

## 2017-12-08 DIAGNOSIS — Z923 Personal history of irradiation: Secondary | ICD-10-CM | POA: Diagnosis not present

## 2017-12-08 DIAGNOSIS — Z9079 Acquired absence of other genital organ(s): Secondary | ICD-10-CM | POA: Insufficient documentation

## 2017-12-08 DIAGNOSIS — Z955 Presence of coronary angioplasty implant and graft: Secondary | ICD-10-CM | POA: Insufficient documentation

## 2017-12-08 DIAGNOSIS — I2510 Atherosclerotic heart disease of native coronary artery without angina pectoris: Secondary | ICD-10-CM | POA: Insufficient documentation

## 2017-12-08 DIAGNOSIS — M19019 Primary osteoarthritis, unspecified shoulder: Secondary | ICD-10-CM | POA: Insufficient documentation

## 2017-12-08 DIAGNOSIS — Z79899 Other long term (current) drug therapy: Secondary | ICD-10-CM | POA: Diagnosis not present

## 2017-12-08 DIAGNOSIS — M479 Spondylosis, unspecified: Secondary | ICD-10-CM | POA: Insufficient documentation

## 2017-12-08 HISTORY — PX: EXTRACORPOREAL SHOCK WAVE LITHOTRIPSY: SHX1557

## 2017-12-08 SURGERY — LITHOTRIPSY, ESWL
Anesthesia: LOCAL | Laterality: Left

## 2017-12-08 MED ORDER — DIPHENHYDRAMINE HCL 25 MG PO CAPS
25.0000 mg | ORAL_CAPSULE | ORAL | Status: AC
  Administered 2017-12-08: 25 mg via ORAL
  Filled 2017-12-08: qty 1

## 2017-12-08 MED ORDER — DIAZEPAM 5 MG PO TABS
10.0000 mg | ORAL_TABLET | ORAL | Status: AC
  Administered 2017-12-08: 10 mg via ORAL
  Filled 2017-12-08: qty 2

## 2017-12-08 MED ORDER — CIPROFLOXACIN HCL 500 MG PO TABS
500.0000 mg | ORAL_TABLET | ORAL | Status: AC
  Administered 2017-12-08: 500 mg via ORAL
  Filled 2017-12-08: qty 1

## 2017-12-08 MED ORDER — SODIUM CHLORIDE 0.9 % IV SOLN
INTRAVENOUS | Status: DC
  Administered 2017-12-08: 07:00:00 via INTRAVENOUS

## 2017-12-08 SURGICAL SUPPLY — 2 items
COVER SURGICAL LIGHT HANDLE (MISCELLANEOUS) ×2 IMPLANT
TOWEL OR 17X26 10 PK STRL BLUE (TOWEL DISPOSABLE) ×2 IMPLANT

## 2017-12-08 NOTE — Op Note (Signed)
See [facility] OP note scanned into chart. 

## 2017-12-08 NOTE — Discharge Instructions (Signed)
See [facility] discharge instructions in chart.  

## 2017-12-09 ENCOUNTER — Encounter (HOSPITAL_COMMUNITY): Payer: Self-pay | Admitting: Urology

## 2017-12-22 ENCOUNTER — Other Ambulatory Visit: Payer: Self-pay | Admitting: Urology

## 2017-12-22 ENCOUNTER — Encounter (HOSPITAL_BASED_OUTPATIENT_CLINIC_OR_DEPARTMENT_OTHER): Payer: Self-pay

## 2017-12-22 ENCOUNTER — Other Ambulatory Visit: Payer: Self-pay

## 2017-12-22 NOTE — Progress Notes (Signed)
Spoke with: Alexie NPO:  After Midnight, no gum, candy, or mints   Arrival time: 5:30AM Labs: Istat8 AM medications: None Pre op orders: No Ride home: Vaughan Basta (wife) [phone]

## 2017-12-30 ENCOUNTER — Ambulatory Visit (HOSPITAL_BASED_OUTPATIENT_CLINIC_OR_DEPARTMENT_OTHER)
Admission: RE | Admit: 2017-12-30 | Discharge: 2017-12-30 | Disposition: A | Discharge status: Home / Self Care | Payer: No Typology Code available for payment source | Source: Ambulatory Visit | Attending: Urology | Admitting: Urology

## 2017-12-30 ENCOUNTER — Ambulatory Visit (HOSPITAL_BASED_OUTPATIENT_CLINIC_OR_DEPARTMENT_OTHER): Payer: No Typology Code available for payment source | Admitting: Anesthesiology

## 2017-12-30 ENCOUNTER — Encounter (HOSPITAL_BASED_OUTPATIENT_CLINIC_OR_DEPARTMENT_OTHER): Payer: Self-pay

## 2017-12-30 ENCOUNTER — Encounter (HOSPITAL_BASED_OUTPATIENT_CLINIC_OR_DEPARTMENT_OTHER)
Admission: RE | Disposition: A | Discharge status: Home / Self Care | Payer: Self-pay | Source: Ambulatory Visit | Attending: Urology

## 2017-12-30 DIAGNOSIS — Z8546 Personal history of malignant neoplasm of prostate: Secondary | ICD-10-CM | POA: Diagnosis not present

## 2017-12-30 DIAGNOSIS — E7800 Pure hypercholesterolemia, unspecified: Secondary | ICD-10-CM | POA: Insufficient documentation

## 2017-12-30 DIAGNOSIS — Z8249 Family history of ischemic heart disease and other diseases of the circulatory system: Secondary | ICD-10-CM | POA: Insufficient documentation

## 2017-12-30 DIAGNOSIS — I252 Old myocardial infarction: Secondary | ICD-10-CM | POA: Diagnosis not present

## 2017-12-30 DIAGNOSIS — N183 Chronic kidney disease, stage 3 (moderate): Secondary | ICD-10-CM | POA: Insufficient documentation

## 2017-12-30 DIAGNOSIS — I129 Hypertensive chronic kidney disease with stage 1 through stage 4 chronic kidney disease, or unspecified chronic kidney disease: Secondary | ICD-10-CM | POA: Diagnosis not present

## 2017-12-30 DIAGNOSIS — Z923 Personal history of irradiation: Secondary | ICD-10-CM | POA: Diagnosis not present

## 2017-12-30 DIAGNOSIS — N200 Calculus of kidney: Secondary | ICD-10-CM | POA: Diagnosis present

## 2017-12-30 DIAGNOSIS — N393 Stress incontinence (female) (male): Secondary | ICD-10-CM | POA: Diagnosis not present

## 2017-12-30 DIAGNOSIS — Z87442 Personal history of urinary calculi: Secondary | ICD-10-CM | POA: Insufficient documentation

## 2017-12-30 DIAGNOSIS — Z87440 Personal history of urinary (tract) infections: Secondary | ICD-10-CM | POA: Insufficient documentation

## 2017-12-30 DIAGNOSIS — I2510 Atherosclerotic heart disease of native coronary artery without angina pectoris: Secondary | ICD-10-CM | POA: Insufficient documentation

## 2017-12-30 DIAGNOSIS — N2889 Other specified disorders of kidney and ureter: Secondary | ICD-10-CM | POA: Insufficient documentation

## 2017-12-30 DIAGNOSIS — Z87891 Personal history of nicotine dependence: Secondary | ICD-10-CM | POA: Insufficient documentation

## 2017-12-30 HISTORY — PX: CYSTOSCOPY/URETEROSCOPY/HOLMIUM LASER/STENT PLACEMENT: SHX6546

## 2017-12-30 LAB — POCT I-STAT, CHEM 8
BUN: 20 mg/dL (ref 6–20)
CHLORIDE: 103 mmol/L (ref 101–111)
CREATININE: 1.3 mg/dL — AB (ref 0.61–1.24)
Calcium, Ion: 1.26 mmol/L (ref 1.15–1.40)
Glucose, Bld: 107 mg/dL — ABNORMAL HIGH (ref 65–99)
HEMATOCRIT: 46 % (ref 39.0–52.0)
Hemoglobin: 15.6 g/dL (ref 13.0–17.0)
POTASSIUM: 3.9 mmol/L (ref 3.5–5.1)
SODIUM: 142 mmol/L (ref 135–145)
TCO2: 25 mmol/L (ref 22–32)

## 2017-12-30 SURGERY — CYSTOSCOPY/URETEROSCOPY/HOLMIUM LASER/STENT PLACEMENT
Anesthesia: General | Site: Ureter | Laterality: Left

## 2017-12-30 MED ORDER — KETOROLAC TROMETHAMINE 30 MG/ML IJ SOLN
INTRAMUSCULAR | Status: AC
Start: 2017-12-30 — End: 2017-12-30
  Filled 2017-12-30: qty 1

## 2017-12-30 MED ORDER — ONDANSETRON HCL 4 MG/2ML IJ SOLN
INTRAMUSCULAR | Status: DC | PRN
  Administered 2017-12-30: 4 mg via INTRAVENOUS

## 2017-12-30 MED ORDER — SODIUM CHLORIDE 0.9 % IR SOLN
Status: DC | PRN
  Administered 2017-12-30: 4000 mL

## 2017-12-30 MED ORDER — ACETAMINOPHEN 500 MG PO TABS
1000.0000 mg | ORAL_TABLET | Freq: Once | ORAL | Status: AC
  Administered 2017-12-30: 1000 mg via ORAL
  Filled 2017-12-30: qty 2

## 2017-12-30 MED ORDER — ACETAMINOPHEN 500 MG PO TABS
ORAL_TABLET | ORAL | Status: AC
  Filled 2017-12-30: qty 2

## 2017-12-30 MED ORDER — FENTANYL CITRATE (PF) 100 MCG/2ML IJ SOLN
INTRAMUSCULAR | Status: DC | PRN
  Administered 2017-12-30 (×2): 50 ug via INTRAVENOUS

## 2017-12-30 MED ORDER — DEXAMETHASONE SODIUM PHOSPHATE 10 MG/ML IJ SOLN
INTRAMUSCULAR | Status: DC | PRN
  Administered 2017-12-30: 10 mg via INTRAVENOUS

## 2017-12-30 MED ORDER — DEXAMETHASONE SODIUM PHOSPHATE 10 MG/ML IJ SOLN
INTRAMUSCULAR | Status: AC
  Filled 2017-12-30: qty 1

## 2017-12-30 MED ORDER — PROPOFOL 10 MG/ML IV BOLUS
INTRAVENOUS | Status: AC
  Filled 2017-12-30: qty 40

## 2017-12-30 MED ORDER — CEFAZOLIN SODIUM-DEXTROSE 2-4 GM/100ML-% IV SOLN
INTRAVENOUS | Status: AC
  Filled 2017-12-30: qty 100

## 2017-12-30 MED ORDER — MIDAZOLAM HCL 2 MG/2ML IJ SOLN
INTRAMUSCULAR | Status: DC | PRN
  Administered 2017-12-30 (×2): 1 mg via INTRAVENOUS

## 2017-12-30 MED ORDER — TRAMADOL HCL 50 MG PO TABS: 50.0000 mg | ORAL_TABLET | Freq: Four times a day (QID) | ORAL | 0 refills | Status: AC | PRN

## 2017-12-30 MED ORDER — PROPOFOL 10 MG/ML IV BOLUS
INTRAVENOUS | Status: DC | PRN
  Administered 2017-12-30: 150 mg via INTRAVENOUS
  Administered 2017-12-30: 50 mg via INTRAVENOUS

## 2017-12-30 MED ORDER — LIDOCAINE 2% (20 MG/ML) 5 ML SYRINGE
INTRAMUSCULAR | Status: DC | PRN
  Administered 2017-12-30: 100 mg via INTRAVENOUS

## 2017-12-30 MED ORDER — MIDAZOLAM HCL 2 MG/2ML IJ SOLN
INTRAMUSCULAR | Status: AC
  Filled 2017-12-30: qty 2

## 2017-12-30 MED ORDER — FENTANYL CITRATE (PF) 100 MCG/2ML IJ SOLN
25.0000 ug | INTRAMUSCULAR | Status: DC | PRN
  Filled 2017-12-30: qty 1

## 2017-12-30 MED ORDER — PHENYLEPHRINE 40 MCG/ML (10ML) SYRINGE FOR IV PUSH (FOR BLOOD PRESSURE SUPPORT)
PREFILLED_SYRINGE | INTRAVENOUS | Status: AC
  Filled 2017-12-30: qty 10

## 2017-12-30 MED ORDER — FENTANYL CITRATE (PF) 100 MCG/2ML IJ SOLN
INTRAMUSCULAR | Status: AC
  Filled 2017-12-30: qty 2

## 2017-12-30 MED ORDER — PHENYLEPHRINE 40 MCG/ML (10ML) SYRINGE FOR IV PUSH (FOR BLOOD PRESSURE SUPPORT)
PREFILLED_SYRINGE | INTRAVENOUS | Status: DC | PRN
  Administered 2017-12-30: 80 ug via INTRAVENOUS
  Administered 2017-12-30 (×2): 120 ug via INTRAVENOUS
  Administered 2017-12-30: 80 ug via INTRAVENOUS

## 2017-12-30 MED ORDER — ONDANSETRON HCL 4 MG/2ML IJ SOLN
4.0000 mg | Freq: Once | INTRAMUSCULAR | Status: DC | PRN
  Filled 2017-12-30: qty 2

## 2017-12-30 MED ORDER — LIDOCAINE 2% (20 MG/ML) 5 ML SYRINGE
INTRAMUSCULAR | Status: AC
  Filled 2017-12-30: qty 5

## 2017-12-30 MED ORDER — ONDANSETRON HCL 4 MG/2ML IJ SOLN
INTRAMUSCULAR | Status: AC
  Filled 2017-12-30: qty 2

## 2017-12-30 MED ORDER — EPHEDRINE SULFATE-NACL 50-0.9 MG/10ML-% IV SOSY
PREFILLED_SYRINGE | INTRAVENOUS | Status: DC | PRN
  Administered 2017-12-30: 10 mg via INTRAVENOUS

## 2017-12-30 MED ORDER — CEFAZOLIN SODIUM-DEXTROSE 2-4 GM/100ML-% IV SOLN
2.0000 g | INTRAVENOUS | Status: AC
  Administered 2017-12-30: 2 g via INTRAVENOUS
  Filled 2017-12-30: qty 100

## 2017-12-30 MED ORDER — STERILE WATER FOR IRRIGATION IR SOLN
Status: DC | PRN
  Administered 2017-12-30: 500 mL

## 2017-12-30 MED ORDER — SODIUM CHLORIDE 0.9 % IV SOLN
INTRAVENOUS | Status: DC
  Administered 2017-12-30: 07:00:00 via INTRAVENOUS
  Filled 2017-12-30: qty 1000

## 2017-12-30 MED ORDER — KETOROLAC TROMETHAMINE 30 MG/ML IJ SOLN
INTRAMUSCULAR | Status: AC
  Filled 2017-12-30: qty 1

## 2017-12-30 SURGICAL SUPPLY — 40 items
BAG DRAIN URO-CYSTO SKYTR STRL (DRAIN) ×3 IMPLANT
BAG DRN UROCATH (DRAIN) ×1
BASKET STONE 1.7 NGAGE (UROLOGICAL SUPPLIES) ×2 IMPLANT
BASKET ZERO TIP NITINOL 2.4FR (BASKET) ×4 IMPLANT
BSKT STON RTRVL ZERO TP 2.4FR (BASKET) ×2
CATH URET 5FR 28IN CONE TIP (BALLOONS)
CATH URET 5FR 28IN OPEN ENDED (CATHETERS) ×2 IMPLANT
CATH URET 5FR 70CM CONE TIP (BALLOONS) IMPLANT
CLOTH BEACON ORANGE TIMEOUT ST (SAFETY) ×3 IMPLANT
ELECT REM PT RETURN 9FT ADLT (ELECTROSURGICAL)
ELECTRODE REM PT RTRN 9FT ADLT (ELECTROSURGICAL) IMPLANT
FIBER LASER FLEXIVA 365 (UROLOGICAL SUPPLIES) IMPLANT
FIBER LASER TRAC TIP (UROLOGICAL SUPPLIES) ×2 IMPLANT
GLOVE BIO SURGEON STRL SZ 6.5 (GLOVE) ×1 IMPLANT
GLOVE BIO SURGEON STRL SZ7.5 (GLOVE) ×2 IMPLANT
GLOVE BIO SURGEONS STRL SZ 6.5 (GLOVE) ×1
GLOVE BIOGEL PI IND STRL 6.5 (GLOVE) IMPLANT
GLOVE BIOGEL PI IND STRL 7.5 (GLOVE) IMPLANT
GLOVE BIOGEL PI INDICATOR 6.5 (GLOVE) ×2
GLOVE BIOGEL PI INDICATOR 7.5 (GLOVE) ×2
GLOVE SURG SS PI 8.0 STRL IVOR (GLOVE) ×3 IMPLANT
GOWN STRL REUS W/ TWL LRG LVL3 (GOWN DISPOSABLE) IMPLANT
GOWN STRL REUS W/TWL LRG LVL3 (GOWN DISPOSABLE) ×3
GOWN STRL REUS W/TWL XL LVL3 (GOWN DISPOSABLE) ×5 IMPLANT
GUIDEWIRE 0.038 PTFE COATED (WIRE) IMPLANT
GUIDEWIRE ANG ZIPWIRE 038X150 (WIRE) IMPLANT
GUIDEWIRE STR DUAL SENSOR (WIRE) ×3 IMPLANT
INFUSOR MANOMETER BAG 3000ML (MISCELLANEOUS) ×3 IMPLANT
IV NS 1000ML (IV SOLUTION) ×3
IV NS 1000ML BAXH (IV SOLUTION) IMPLANT
IV NS IRRIG 3000ML ARTHROMATIC (IV SOLUTION) ×3 IMPLANT
KIT RM TURNOVER CYSTO AR (KITS) ×3 IMPLANT
MANIFOLD NEPTUNE II (INSTRUMENTS) ×3 IMPLANT
NS IRRIG 500ML POUR BTL (IV SOLUTION) ×1 IMPLANT
PACK CYSTO (CUSTOM PROCEDURE TRAY) ×3 IMPLANT
SHEATH ACCESS URETERAL 54CM (SHEATH) ×2 IMPLANT
STENT URET 6FRX26 CONTOUR (STENTS) ×2 IMPLANT
TUBE CONNECTING 12'X1/4 (SUCTIONS) ×1
TUBE CONNECTING 12X1/4 (SUCTIONS) ×1 IMPLANT
WATER STERILE IRR 500ML POUR (IV SOLUTION) ×2 IMPLANT

## 2017-12-30 NOTE — Discharge Instructions (Addendum)
CYSTOSCOPY HOME CARE INSTRUCTIONS  Activity: Rest for the remainder of the day.  Do not drive or operate equipment today.  You may resume normal activities in one to two days as instructed by your physician.   Meals: Drink plenty of liquids and eat light foods such as gelatin or soup this evening.  You may return to a normal meal plan tomorrow.  Return to Work: You may return to work in one to two days or as instructed by your physician.  Special Instructions / Symptoms: Call your physician if any of these symptoms occur:   -persistent or heavy bleeding  -bleeding which continues after first few urination  -large blood clots that are difficult to pass  -urine stream diminishes or stops completely  -fever equal to or higher than 101 degrees Farenheit.  -cloudy urine with a strong, foul odor  -severe pain  Females should always wipe from front to back after elimination.  You may feel some burning pain when you urinate.  This should disappear with time.  Applying moist heat to the lower abdomen or a hot tub bath may help relieve the pain. \  Follow-Up / Date of Return Visit to Your Physician:  As instructed Call for an appointment to arrange follow-up.  Patient Signature:  ________________________________________________________  Nurse's Signature:  ________________________________________________________  Post Anesthesia Home Care Instructions  Activity: Get plenty of rest for the remainder of the day. A responsible individual must stay with you for 24 hours following the procedure.  For the next 24 hours, DO NOT: -Drive a car -Paediatric nurse -Drink alcoholic beverages -Take any medication unless instructed by your physician -Make any legal decisions or sign important papers.  Meals: Start with liquid foods such as gelatin or soup. Progress to regular foods as tolerated. Avoid greasy, spicy, heavy foods. If nausea and/or vomiting occur, drink only clear liquids until the  nausea and/or vomiting subsides. Call your physician if vomiting continues.  Special Instructions/Symptoms: Your throat may feel dry or sore from the anesthesia or the breathing tube placed in your throat during surgery. If this causes discomfort, gargle with warm salt water. The discomfort should disappear within 24 hours.  If you had a scopolamine patch placed behind your ear for the management of post- operative nausea and/or vomiting:  1. The medication in the patch is effective for 72 hours, after which it should be removed.  Wrap patch in a tissue and discard in the trash. Wash hands thoroughly with soap and water. 2. You may remove the patch earlier than 72 hours if you experience unpleasant side effects which may include dry mouth, dizziness or visual disturbances. 3. Avoid touching the patch. Wash your hands with soap and water after contact with the patch.   Ureteral Stent Implantation, Care After Refer to this sheet in the next few weeks. These instructions provide you with information about caring for yourself after your procedure. Your health care provider may also give you more specific instructions. Your treatment has been planned according to current medical practices, but problems sometimes occur. Call your health care provider if you have any problems or questions after your procedure. What can I expect after the procedure? After the procedure, it is common to have:  Nausea.  Mild pain when you urinate. You may feel this pain in your lower back or lower abdomen. Pain should stop within a few minutes after you urinate. This may last for up to 1 week.  A small amount of blood in your  urine for several days.  Follow these instructions at home:  Medicines  Take over-the-counter and prescription medicines only as told by your health care provider.  If you were prescribed an antibiotic medicine, take it as told by your health care provider. Do not stop taking the antibiotic  even if you start to feel better.  Do not drive for 24 hours if you received a sedative.  Do not drive or operate heavy machinery while taking prescription pain medicines. Activity  Return to your normal activities as told by your health care provider. Ask your health care provider what activities are safe for you.  Do not lift anything that is heavier than 10 lb (4.5 kg). Follow this limit for 1 week after your procedure, or for as long as told by your health care provider. General instructions  Watch for any blood in your urine. Call your health care provider if the amount of blood in your urine increases.  If you have a catheter: ? Follow instructions from your health care provider about taking care of your catheter and collection bag. ? Do not take baths, swim, or use a hot tub until your health care provider approves.  Drink enough fluid to keep your urine clear or pale yellow.  Keep all follow-up visits as told by your health care provider. This is important. Contact a health care provider if:  You have pain that gets worse or does not get better with medicine, especially pain when you urinate.  You have difficulty urinating.  You feel nauseous or you vomit repeatedly during a period of more than 2 days after the procedure. Get help right away if:  Your urine is dark red or has blood clots in it.  You are leaking urine (have incontinence).  The end of the stent comes out of your urethra.  You cannot urinate.  You have sudden, sharp, or severe pain in your abdomen or lower back.  You have a fever.  You may remove the stent by pulling the attached string on Friday morning.   This information is not intended to replace advice given to you by your health care provider. Make sure you discuss any questions you have with your health care provider. Document Released: 07/14/2013 Document Revised: 04/18/2016 Document Reviewed: 05/26/2015 Elsevier Interactive Patient  Education  [employer].

## 2017-12-30 NOTE — Anesthesia Postprocedure Evaluation (Signed)
Anesthesia Post Note  Patient: [name]  Procedure(s) Performed: CYSTOSCOPY LEFT URETEROSCOPY WITH HOLMIUM LASER AND  LEFT STENT PLACEMENT (Left Ureter)     Patient location during evaluation: PACU Anesthesia Type: General Level of consciousness: awake and alert Pain management: pain level controlled Vital Signs Assessment: post-procedure vital signs reviewed and stable Respiratory status: spontaneous breathing, nonlabored ventilation and respiratory function stable Cardiovascular status: blood pressure returned to baseline and stable Postop Assessment: no apparent nausea or vomiting Anesthetic complications: no    Last Vitals:  Vitals:   12/30/17 0930 12/30/17 1010  BP: (!) 145/84 (!) 141/87  Pulse: 71 76  Resp: 13 16  Temp:  36.6 C  SpO2:  98%    Last Pain:  Vitals:   12/30/17 1010  TempSrc: Oral                 Catalina Gravel

## 2017-12-30 NOTE — Transfer of Care (Signed)
  Last Vitals:  Vitals:   12/30/17 0530  BP: 135/77  Pulse: 63  Resp: 16  Temp: 36.6 C  SpO2: 98%    Last Pain:  Vitals:   12/30/17 0530  TempSrc: Oral      Patients Stated Pain Goal: 6 (12/30/17 0636)  Immediate Anesthesia Transfer of Care Note  Patient: [name]  Procedure(s) Performed: Procedure(s) (LRB): CYSTOSCOPY LEFT URETEROSCOPY WITH HOLMIUM LASER AND  LEFT STENT PLACEMENT (Left)  Patient Location: PACU  Anesthesia Type: General  Level of Consciousness: awake, alert  and oriented  Airway & Oxygen Therapy: Patient Spontanous Breathing and Patient connected to nasal cannula oxygen  Post-op Assessment: Report given to PACU RN and Post -op Vital signs reviewed and stable  Post vital signs: Reviewed and stable  Complications: No apparent anesthesia complications

## 2017-12-30 NOTE — Anesthesia Preprocedure Evaluation (Addendum)
Anesthesia Evaluation  Patient identified by MRN, date of birth, ID band Patient awake    Reviewed: Allergy & Precautions, NPO status , Patient's Chart, lab work & pertinent test results, reviewed documented beta blocker date and time   History of Anesthesia Complications (+) PONV and history of anesthetic complications  Airway Mallampati: II  TM Distance: >3 FB Neck ROM: Full    Dental  (+) Teeth Intact, Dental Advisory Given, Caps   Pulmonary former smoker,    Pulmonary exam normal breath sounds clear to auscultation       Cardiovascular hypertension, Pt. on home beta blockers + CAD, + Past MI and + Cardiac Stents (BMS, DES Cx in '04)  Normal cardiovascular exam Rhythm:Regular Rate:Normal  '15 ETT: no ischemia or st changes; low risk nuclear study  '11 ECHO: EF 60-65%, valves Ok   Neuro/Psych  Neuromuscular disease negative psych ROS   GI/Hepatic negative GI ROS, Neg liver ROS,   Endo/Other  negative endocrine ROS  Renal/GU Renal InsufficiencyRenal disease   Bladder cancer    Musculoskeletal negative musculoskeletal ROS (+)   Abdominal   Peds  Hematology negative hematology ROS (+)   Anesthesia Other Findings Day of surgery medications reviewed with the patient.  Reproductive/Obstetrics                           Anesthesia Physical Anesthesia Plan  ASA: III  Anesthesia Plan: General   Post-op Pain Management:    Induction: Intravenous  PONV Risk Score and Plan: 4 or greater and Dexamethasone, Ondansetron, Treatment may vary due to age or medical condition and Propofol infusion  Airway Management Planned: LMA  Additional Equipment:   Intra-op Plan:   Post-operative Plan: Extubation in OR  Informed Consent: I have reviewed the patients History and Physical, chart, labs and discussed the procedure including the risks, benefits and alternatives for the proposed anesthesia  with the patient or authorized representative who has indicated his/her understanding and acceptance.   Dental advisory given  Plan Discussed with: CRNA  Anesthesia Plan Comments: (Risks/benefits of general anesthesia discussed with patient including risk of damage to teeth, lips, gum, and tongue, nausea/vomiting, allergic reactions to medications, and the possibility of heart attack, stroke and death.  All patient questions answered.  Patient wishes to proceed.)       Anesthesia Quick Evaluation

## 2017-12-30 NOTE — Op Note (Signed)
Procedure: Cystoscopy with ureteroscopy and laser with removal and replacement of left double-J stent.  Preop diagnosis: Left renal stones.   Postoperative diagnosis: Same with infundibular stenosis.  Surgeon: Dr. Irine Seal.  Anesthesia: General.  Specimen: Stone fragments.  Drain: 6 [place] by 26 cm left contour double-J stent with tether.  Blood loss: Minimal.  Complications: None.  Indications: [name] is a 72 year old white male with a history of urolithiasis and a functionally solitary left kidney.  He had had prior prior cystoscopy with left ureteral stent insertion followed by lithotripsy however the stones fragmented poorly in the past minimal fragments.  It was felt that ureteroscopy was most appropriate for secondary therapy.  Procedure: He was taken to the operating room where he was given Ancef and a general anesthetic was induced.  He was placed in lithotomy position was fitted with PAS hose.  His perineum and genitalia were prepped with Betadine solution and draped in usual sterile fashion.  Cystoscopy was performed using the 23 [place] scope and 30 degree lens.  Examination revealed a normal urethra with the exception of a mild membranous sphincter.  The prostate was absent.  Examination of the bladder revealed edema at the left ureteral orifice with a stent with some evidence of encrustation.  The right ureteral orifice was unremarkable.  The bladder wall had mild trabeculation with no other mucosal lesions.  The left ureteral stent was grasped with a grasping forceps and pulled to the urethral meatus.  An attempt was made to pass a sensor wire through the stent however there was encrustation of the proximal limbs of the stent so the wire would not pass.  The stent was then removed and the cystoscope was reinserted.  Using a 5 [place] opening catheter to stiffen a sensor wire I was able to get the wire through the orifice and up to the kidney.  A 54 cm 12/14 digital access  sheath was then used.  initially the inner core was passed, followed by the assembled sheath.  The inner core and wire were then removed.  A dual-lumen digital flexible scope was then inserted and the collecting system was inspected.  There was a stone in a mid pole calyx on fluoroscopy however inspection of this area with the ureteroscope demonstrated infundibular stenosis the precluded access of the calyx with the stone.  I then inspected the lower pole and there was a calyx with stenosis with a stone visible through the narrowed area.  I then passed the 200 m tract tip laser fiber with initial settings of 8 W and 8 Hz.  The stone was engaged with the laser and initial fragmentation was performed.  Throughout the treatment the power was increased to 1 W and the frequency to 20 Hz followed by 50 Hz.  As the treatment progressed the infundibulum opened up due to laser effect and dilation and I was able to get the ureteroscope into the calyx.  Multiple fragments were removed using both the engage and 0 tipped baskets.  There was a fragment that I could not access due to difficulty ambulating the scope into the corner of the calyx.  I eventually abandoned efforts to try to remove this piece after multiple attempts with a basket and the laser to dislodge it.  All significant fragments were removed but there was some grit sand that was left that will need to be passed.  Ureteroscope was removed and the sensor wire was reinserted through the sheath.  The sheath was removed.  The cystoscope was reinserted over the wire.  A 6 French 26 cm contour double-J stent with tether was then passed over the wire to the kidney under fluoroscopic guidance.  The wire was removed leaving good coil in the kidney and a good coil in the bladder.  The bladder was then drained and the cystoscope was removed, leaving the stent string exiting the urethra.  The string was secured to the patient's penis.  He was taken down from the  lithotomy position.  His anesthetic was reversed and he was moved to recovery room in stable condition.  There were no complications.

## 2017-12-30 NOTE — Anesthesia Procedure Notes (Signed)
Procedure Name: LMA Insertion Date/Time: 12/30/2017 7:30 AM Performed by: Catalina Gravel, MD Pre-anesthesia Checklist: Patient identified, Emergency Drugs available, Suction available and Patient being monitored Patient Re-evaluated:Patient Re-evaluated prior to induction Oxygen Delivery Method: Circle system utilized Preoxygenation: Pre-oxygenation with 100% oxygen Induction Type: IV induction Ventilation: Mask ventilation without difficulty LMA: LMA inserted LMA Size: 4.0 Number of attempts: 1 Airway Equipment and Method: Bite block Placement Confirmation: positive ETCO2 Tube secured with: Tape Dental Injury: Teeth and Oropharynx as per pre-operative assessment

## 2017-12-30 NOTE — Interval H&P Note (Signed)
History and Physical Interval Note:  He had ESWL on 1/13 with partial fragmentation of the two stones.   He is to have ureteroscopy today for management of the fragments.   12/30/2017 7:24 AM  [name]  has presented today for surgery, with the diagnosis of LEFT RENAL STONES  The various methods of treatment have been discussed with the patient and family. After consideration of risks, benefits and other options for treatment, the patient has consented to  Procedure(s): CYSTOSCOPY LEFT URETEROSCOPY WITH HOLMIUM LASER AND  LEFT STENT PLACEMENT (Left) as a surgical intervention .  The patient's history has been reviewed, patient examined, no change in status, stable for surgery.  I have reviewed the patient's chart and labs.  Questions were answered to the patient's satisfaction.     Irine Seal

## 2017-12-31 ENCOUNTER — Encounter (HOSPITAL_BASED_OUTPATIENT_CLINIC_OR_DEPARTMENT_OTHER): Payer: Self-pay | Admitting: Urology

## 2018-01-05 ENCOUNTER — Ambulatory Visit (HOSPITAL_COMMUNITY): Payer: No Typology Code available for payment source

## 2018-01-05 ENCOUNTER — Other Ambulatory Visit: Payer: Self-pay | Admitting: Urology

## 2018-01-05 ENCOUNTER — Encounter (HOSPITAL_COMMUNITY): Payer: Self-pay | Admitting: *Deleted

## 2018-01-05 ENCOUNTER — Ambulatory Visit (HOSPITAL_COMMUNITY): Payer: No Typology Code available for payment source | Admitting: Anesthesiology

## 2018-01-05 ENCOUNTER — Other Ambulatory Visit: Payer: Self-pay

## 2018-01-05 ENCOUNTER — Observation Stay (HOSPITAL_COMMUNITY)
Admission: RE | Admit: 2018-01-05 | Discharge: 2018-01-06 | Disposition: A | Discharge status: Home / Self Care | Payer: No Typology Code available for payment source | Source: Ambulatory Visit | Attending: Urology | Admitting: Urology

## 2018-01-05 ENCOUNTER — Encounter (HOSPITAL_COMMUNITY)
Admission: RE | Disposition: A | Discharge status: Home / Self Care | Payer: Self-pay | Source: Ambulatory Visit | Attending: Urology

## 2018-01-05 DIAGNOSIS — N5201 Erectile dysfunction due to arterial insufficiency: Secondary | ICD-10-CM | POA: Diagnosis not present

## 2018-01-05 DIAGNOSIS — Z7982 Long term (current) use of aspirin: Secondary | ICD-10-CM | POA: Insufficient documentation

## 2018-01-05 DIAGNOSIS — N183 Chronic kidney disease, stage 3 (moderate): Secondary | ICD-10-CM | POA: Insufficient documentation

## 2018-01-05 DIAGNOSIS — Z79899 Other long term (current) drug therapy: Secondary | ICD-10-CM | POA: Diagnosis not present

## 2018-01-05 DIAGNOSIS — M47812 Spondylosis without myelopathy or radiculopathy, cervical region: Secondary | ICD-10-CM | POA: Diagnosis not present

## 2018-01-05 DIAGNOSIS — N189 Chronic kidney disease, unspecified: Secondary | ICD-10-CM | POA: Diagnosis present

## 2018-01-05 DIAGNOSIS — Z8551 Personal history of malignant neoplasm of bladder: Secondary | ICD-10-CM | POA: Diagnosis not present

## 2018-01-05 DIAGNOSIS — Z955 Presence of coronary angioplasty implant and graft: Secondary | ICD-10-CM | POA: Insufficient documentation

## 2018-01-05 DIAGNOSIS — I2510 Atherosclerotic heart disease of native coronary artery without angina pectoris: Secondary | ICD-10-CM | POA: Diagnosis not present

## 2018-01-05 DIAGNOSIS — Z87440 Personal history of urinary (tract) infections: Secondary | ICD-10-CM | POA: Diagnosis not present

## 2018-01-05 DIAGNOSIS — Z923 Personal history of irradiation: Secondary | ICD-10-CM | POA: Diagnosis not present

## 2018-01-05 DIAGNOSIS — N179 Acute kidney failure, unspecified: Secondary | ICD-10-CM | POA: Diagnosis present

## 2018-01-05 DIAGNOSIS — E785 Hyperlipidemia, unspecified: Secondary | ICD-10-CM | POA: Diagnosis not present

## 2018-01-05 DIAGNOSIS — M19012 Primary osteoarthritis, left shoulder: Secondary | ICD-10-CM | POA: Diagnosis not present

## 2018-01-05 DIAGNOSIS — Z9889 Other specified postprocedural states: Secondary | ICD-10-CM | POA: Diagnosis not present

## 2018-01-05 DIAGNOSIS — Z8719 Personal history of other diseases of the digestive system: Secondary | ICD-10-CM | POA: Insufficient documentation

## 2018-01-05 DIAGNOSIS — N132 Hydronephrosis with renal and ureteral calculous obstruction: Principal | ICD-10-CM | POA: Diagnosis present

## 2018-01-05 DIAGNOSIS — Z8546 Personal history of malignant neoplasm of prostate: Secondary | ICD-10-CM | POA: Diagnosis not present

## 2018-01-05 DIAGNOSIS — I129 Hypertensive chronic kidney disease with stage 1 through stage 4 chronic kidney disease, or unspecified chronic kidney disease: Secondary | ICD-10-CM | POA: Insufficient documentation

## 2018-01-05 DIAGNOSIS — M109 Gout, unspecified: Secondary | ICD-10-CM | POA: Insufficient documentation

## 2018-01-05 DIAGNOSIS — Z87891 Personal history of nicotine dependence: Secondary | ICD-10-CM | POA: Insufficient documentation

## 2018-01-05 DIAGNOSIS — M19011 Primary osteoarthritis, right shoulder: Secondary | ICD-10-CM | POA: Insufficient documentation

## 2018-01-05 DIAGNOSIS — I252 Old myocardial infarction: Secondary | ICD-10-CM | POA: Diagnosis not present

## 2018-01-05 DIAGNOSIS — Z87442 Personal history of urinary calculi: Secondary | ICD-10-CM | POA: Diagnosis not present

## 2018-01-05 HISTORY — PX: CYSTOSCOPY W/ URETERAL STENT PLACEMENT: SHX1429

## 2018-01-05 LAB — CBC
HEMATOCRIT: 47.5 % (ref 39.0–52.0)
HEMOGLOBIN: 16.6 g/dL (ref 13.0–17.0)
MCH: 32.9 pg (ref 26.0–34.0)
MCHC: 34.9 g/dL (ref 30.0–36.0)
MCV: 94.1 fL (ref 78.0–100.0)
Platelets: 147 10*3/uL — ABNORMAL LOW (ref 150–400)
RBC: 5.05 MIL/uL (ref 4.22–5.81)
RDW: 13.4 % (ref 11.5–15.5)
WBC: 13.4 10*3/uL — ABNORMAL HIGH (ref 4.0–10.5)

## 2018-01-05 LAB — COMPREHENSIVE METABOLIC PANEL
ALBUMIN: 4.3 g/dL (ref 3.5–5.0)
ALT: 12 U/L — ABNORMAL LOW (ref 17–63)
ANION GAP: 13 (ref 5–15)
AST: 23 U/L (ref 15–41)
Alkaline Phosphatase: 82 U/L (ref 38–126)
BILIRUBIN TOTAL: 1.8 mg/dL — AB (ref 0.3–1.2)
BUN: 45 mg/dL — AB (ref 6–20)
CALCIUM: 9.4 mg/dL (ref 8.9–10.3)
CO2: 24 mmol/L (ref 22–32)
CREATININE: 3.7 mg/dL — AB (ref 0.61–1.24)
Chloride: 97 mmol/L — ABNORMAL LOW (ref 101–111)
GFR calc Af Amer: 18 mL/min — ABNORMAL LOW (ref >60)
GFR calc non Af Amer: 15 mL/min — ABNORMAL LOW (ref >60)
GLUCOSE: 122 mg/dL — AB (ref 65–99)
Potassium: 3.9 mmol/L (ref 3.5–5.1)
Sodium: 134 mmol/L — ABNORMAL LOW (ref 135–145)
TOTAL PROTEIN: 8.5 g/dL — AB (ref 6.5–8.1)

## 2018-01-05 SURGERY — CYSTOSCOPY, WITH RETROGRADE PYELOGRAM AND URETERAL STENT INSERTION
Anesthesia: General | Site: Ureter | Laterality: Left

## 2018-01-05 MED ORDER — OXYBUTYNIN CHLORIDE 5 MG PO TABS: 5.0000 mg | ORAL_TABLET | Freq: Three times a day (TID) | ORAL | Status: DC | PRN

## 2018-01-05 MED ORDER — HYDROMORPHONE HCL 1 MG/ML IJ SOLN: 0.5000 mg | INTRAMUSCULAR | Status: DC | PRN

## 2018-01-05 MED ORDER — FENTANYL CITRATE (PF) 100 MCG/2ML IJ SOLN
INTRAMUSCULAR | Status: AC
  Filled 2018-01-05: qty 2

## 2018-01-05 MED ORDER — SODIUM CHLORIDE 0.9 % IR SOLN
Status: DC | PRN
Start: 2018-01-05 — End: 2018-01-05
  Administered 2018-01-05: 3000 mL

## 2018-01-05 MED ORDER — BELLADONNA ALKALOIDS-OPIUM 16.2-60 MG RE SUPP: 1.0000 | Freq: Four times a day (QID) | RECTAL | Status: DC | PRN

## 2018-01-05 MED ORDER — OXYCODONE HCL 5 MG/5ML PO SOLN
5.0000 mg | Freq: Once | ORAL | Status: DC | PRN
  Filled 2018-01-05: qty 5

## 2018-01-05 MED ORDER — ONDANSETRON HCL 4 MG/2ML IJ SOLN: 4.0000 mg | INTRAMUSCULAR | Status: DC | PRN

## 2018-01-05 MED ORDER — FENTANYL CITRATE (PF) 100 MCG/2ML IJ SOLN
INTRAMUSCULAR | Status: DC | PRN
  Administered 2018-01-05 (×2): 50 ug via INTRAVENOUS

## 2018-01-05 MED ORDER — IOHEXOL 300 MG/ML  SOLN
INTRAMUSCULAR | Status: DC | PRN
  Administered 2018-01-05: 5 mL

## 2018-01-05 MED ORDER — ONDANSETRON HCL 4 MG/2ML IJ SOLN
INTRAMUSCULAR | Status: DC | PRN
  Administered 2018-01-05: 4 mg via INTRAVENOUS

## 2018-01-05 MED ORDER — LACTATED RINGERS IV SOLN
INTRAVENOUS | Status: DC
Start: 2018-01-05 — End: 2018-01-05
  Administered 2018-01-05: 16:00:00 via INTRAVENOUS

## 2018-01-05 MED ORDER — DEXAMETHASONE SODIUM PHOSPHATE 10 MG/ML IJ SOLN
INTRAMUSCULAR | Status: DC | PRN
Start: 2018-01-05 — End: 2018-01-05
  Administered 2018-01-05: 10 mg via INTRAVENOUS

## 2018-01-05 MED ORDER — ONDANSETRON HCL 4 MG/2ML IJ SOLN
4.0000 mg | Freq: Once | INTRAMUSCULAR | Status: DC | PRN
Start: 2018-01-05 — End: 2018-01-05

## 2018-01-05 MED ORDER — HYDROCODONE-ACETAMINOPHEN 5-325 MG PO TABS: 1.0000 | ORAL_TABLET | ORAL | Status: DC | PRN

## 2018-01-05 MED ORDER — PHENYLEPHRINE HCL 10 MG/ML IJ SOLN
INTRAMUSCULAR | Status: DC | PRN
  Administered 2018-01-05 (×3): 80 ug via INTRAVENOUS

## 2018-01-05 MED ORDER — PROPOFOL 10 MG/ML IV BOLUS
INTRAVENOUS | Status: DC | PRN
  Administered 2018-01-05: 50 mg via INTRAVENOUS
  Administered 2018-01-05: 150 mg via INTRAVENOUS

## 2018-01-05 MED ORDER — OXYCODONE HCL 5 MG PO TABS: 5.0000 mg | ORAL_TABLET | Freq: Once | ORAL | Status: DC | PRN

## 2018-01-05 MED ORDER — LIDOCAINE 2% (20 MG/ML) 5 ML SYRINGE
INTRAMUSCULAR | Status: DC | PRN
  Administered 2018-01-05: 100 mg via INTRAVENOUS

## 2018-01-05 MED ORDER — FENTANYL CITRATE (PF) 100 MCG/2ML IJ SOLN: 25.0000 ug | INTRAMUSCULAR | Status: DC | PRN

## 2018-01-05 MED ORDER — SODIUM CHLORIDE 0.9 % IV SOLN
INTRAVENOUS | Status: DC
  Administered 2018-01-05 – 2018-01-06 (×2): via INTRAVENOUS

## 2018-01-05 MED ORDER — ACETAMINOPHEN 325 MG PO TABS: 650.0000 mg | ORAL_TABLET | ORAL | Status: DC | PRN

## 2018-01-05 MED ORDER — CEFAZOLIN SODIUM-DEXTROSE 2-4 GM/100ML-% IV SOLN
2.0000 g | Freq: Once | INTRAVENOUS | Status: AC
  Administered 2018-01-05: 2 g via INTRAVENOUS
  Filled 2018-01-05: qty 100

## 2018-01-05 SURGICAL SUPPLY — 16 items
BAG URO CATCHER STRL LF (MISCELLANEOUS) ×3 IMPLANT
BASKET ZERO TIP NITINOL 2.4FR (BASKET) ×2 IMPLANT
BSKT STON RTRVL ZERO TP 2.4FR (BASKET) ×1
CATH INTERMIT  6FR 70CM (CATHETERS) ×3 IMPLANT
CLOTH BEACON ORANGE TIMEOUT ST (SAFETY) ×3 IMPLANT
COVER FOOTSWITCH UNIV (MISCELLANEOUS) IMPLANT
COVER SURGICAL LIGHT HANDLE (MISCELLANEOUS) ×3 IMPLANT
GLOVE BIOGEL M STRL SZ7.5 (GLOVE) ×3 IMPLANT
GOWN STRL REUS W/TWL LRG LVL3 (GOWN DISPOSABLE) ×6 IMPLANT
GUIDEWIRE ANG ZIPWIRE 038X150 (WIRE) ×2 IMPLANT
GUIDEWIRE STR DUAL SENSOR (WIRE) ×3 IMPLANT
MANIFOLD NEPTUNE II (INSTRUMENTS) ×3 IMPLANT
PACK CYSTO (CUSTOM PROCEDURE TRAY) ×3 IMPLANT
STENT URET 6FRX24 CONTOUR (STENTS) ×2 IMPLANT
TUBING CONNECTING 10 (TUBING) ×2 IMPLANT
TUBING CONNECTING 10' (TUBING) ×1

## 2018-01-05 NOTE — Transfer of Care (Signed)
Immediate Anesthesia Transfer of Care Note  Patient: BERTIS HUSTEAD  Procedure(s) Performed: CYSTOSCOPY WITH LEFT RETROGRADE PYELOGRAM/ LEFT URETEROSCOPY/STONE BASKET REMOVAL/ LEFT URETERAL STENT PLACEMENT (Left Ureter)  Patient Location: PACU  Anesthesia Type:General  Level of Consciousness: awake, alert , oriented and patient cooperative  Airway & Oxygen Therapy: Patient Spontanous Breathing and Patient connected to nasal cannula oxygen  Post-op Assessment: Report given to RN and Post -op Vital signs reviewed and stable  Post vital signs: Reviewed and stable  Last Vitals:  Vitals:   01/05/18 1551  BP: (!) 139/95  Pulse: (!) 101  Temp: 36.6 C  SpO2: 99%    Last Pain:  Vitals:   01/05/18 1551  TempSrc: Oral         Complications: No apparent anesthesia complications

## 2018-01-05 NOTE — Anesthesia Preprocedure Evaluation (Addendum)
Anesthesia Evaluation  Patient identified by MRN, date of birth, ID band Patient awake    Reviewed: Allergy & Precautions, NPO status , Patient's Chart, lab work & pertinent test results, reviewed documented beta blocker date and time   History of Anesthesia Complications (+) PONV and history of anesthetic complications  Airway Mallampati: III  TM Distance: >3 FB Neck ROM: Full    Dental  (+) Teeth Intact, Dental Advisory Given, Caps   Pulmonary former smoker,    Pulmonary exam normal breath sounds clear to auscultation       Cardiovascular hypertension, Pt. on home beta blockers and Pt. on medications + CAD, + Past MI and + Cardiac Stents (BMS, DES Cx in '04)  Normal cardiovascular exam Rhythm:Regular Rate:Normal  '15 ETT: no ischemia or st changes; low risk nuclear study  '11 ECHO: EF 60-65%, valves Ok   Neuro/Psych  Neuromuscular disease negative psych ROS   GI/Hepatic negative GI ROS, Neg liver ROS,   Endo/Other  negative endocrine ROS  Renal/GU CRFRenal disease   Bladder cancer    Musculoskeletal  (+) Arthritis ,   Abdominal   Peds  Hematology negative hematology ROS (+)   Anesthesia Other Findings   Reproductive/Obstetrics                            Anesthesia Physical  Anesthesia Plan  ASA: III  Anesthesia Plan: General   Post-op Pain Management:    Induction: Intravenous  PONV Risk Score and Plan: 4 or greater and Dexamethasone, Ondansetron, Treatment may vary due to age or medical condition and Propofol infusion  Airway Management Planned: LMA  Additional Equipment: None  Intra-op Plan:   Post-operative Plan: Extubation in OR  Informed Consent: I have reviewed the patients History and Physical, chart, labs and discussed the procedure including the risks, benefits and alternatives for the proposed anesthesia with the patient or authorized representative who has  indicated his/her understanding and acceptance.   Dental advisory given  Plan Discussed with: CRNA  Anesthesia Plan Comments:        Anesthesia Quick Evaluation

## 2018-01-05 NOTE — H&P (Signed)
Urology Preoperative H&P   Chief Complaint: Left flank pain  History of Present Illness: [name] is a 72 y.o. male s/p left ureteroscopy on 2/5 for residual stone fragments following a left ESWL on 12/08/17 (he initially had 15 mm and 11 mm left renal pelvic stones). He removed his ureteral stent on 2/8 with some small stones noted. He states that since that time, he has continued to have severe left flank pain. He states that this radiates to his abdomen on occasion. He states that his pain level has been at a 9-10 the past 2 days and is at a level 8 today. He has been using Tramadol about every 6 hours, which helps minimally. He has associated nausea. He denies any difficulties voiding. He denies gross hematuria or fevers. Baseline creatinine is 1.5 and he has a history of right renal atrophy.  His creatinine today was 3.6.  RUS performed in the office today demonstrated moderate to severe left hydronephrosis.  KUB was negative for any obvious stone fragments along the expected course of the left ureter.     Past Medical History:  Diagnosis Date  . Atrophy of right kidney   . CKD (chronic kidney disease), stage III ([place])   . CORONARY ARTERY DISEASE CARDIOLOGIST--  DR WALL AND DR Angelena Form----   currently under [facility] in [place]   ETT  05-06-2014,  no ischemia or st changes;  low risk nuclear study 04-23-2011--- a. s/p post wall MI  2004 => overlapping stents in the CFX (one Zeta stent and one Taxus DES);  b. [place] => LHC 2/11: Normal LM, normal LAD, normal Dx, CFX 80% ISR, PDA 30-40%, EF 55%. PCI: Cutting balloon angioplasty to the CFX ISR;  c.  Lex MV 5/12: inf and IL defect c/w prior MI, no ischemia, EF 54%  . Cystitis, radiation    2013 following salvage therapy for recurrent prostate cancer post prostatectomy   . DDD (degenerative disc disease), lumbar   . Diverticulosis of colon   . GOUT 06/14/2008  . H/O agent Orange exposure   . History of bladder cancer 2016    urologsit-  dr Jeffie Pollock  . History of kidney stones   . History of myocardial infarction    04-29-2003  -- posterior and lateral wall MI  s/p  stent x2 to  left mid CFX  . History of recurrent UTIs    chronic  . Hyperlipidemia   . Hypertension   . Nephrolithiasis    bilateral  . Nocturia   . Normal exercise tolerance test results    05-06-2014  Negative for ischemia GXT with the pt exercising to a 10 met work load and 89% APMHR without new diagnotic changes of ischemia  . OA (osteoarthritis)    neck and shoulders  . Organic impotence    arterial insuff.  [name] Kitchen PONV (postoperative nausea and vomiting)   . Recurrent prostate adenocarcinoma [facility]) urologist--  dr Irine Seal---- per pt last PSA 0.12 as of 12-02-2017   first dx  2001,  T2b  N0  M0,  Gleason 6--  s/p  radical prostatectomy/   recurrent 11/ 2012  s/p salvage radiation   . Rosacea   . S/P coronary artery stent placement    BMS x1  and DES x1  to mid left CFX  04-29-2003  . S/P radiation therapy 10/02/11 - 11/25/11   Central Lower Pelvis: 1157 cGy/38 Fractions--  for recurrent prostate cancer  . Spinal stenosis   .  SUI (stress urinary incontinence), male   . Wears glasses     Past Surgical History:  Procedure Laterality Date  . ANTERIOR CERVICAL DECOMP/DISCECTOMY FUSION  2001   w/ removal bone spur--  1 level  . BONE CYST EXCISION  2012   LUMBAR  . CARDIOVASCULAR STRESS TEST  04-23-2011  dr Angelena Form   Low risk nuclear study/ fixed mixed basal inferior and inferolateral perfusion defect with wall motion abnormality that suggest prior MI, no significant ishemia/  ef 54%  . COLONOSCOPY  03-04-2007  . CORONARY ANGIOPLASTY  01-22-2010  dr Darnell Level brodie   successfully cutting angioplasty to in-stent restenosis  mCFX  . CORONARY ANGIOPLASTY WITH STENT PLACEMENT  04-29-2003  dr Lyndel Safe   BMS x1 and DES x1 to mid left CFX for total occlusion 99%/  nonobstructive dLAD 60% and pLAD30%,  pRCA 30%,  lateral and mid inferior akinesis,   moderate irregularities LM,  D2 and D3 40%,  ef 50%  . CYSTO W/ PLACEMENT RIGHT URETERAL STENT  07-19-2004  . CYSTOSCOPY N/A 01/03/2015   Procedure: CYSTOSCOPY WITH FULGURATION OF BLEEDERS;  Surgeon: Malka So, MD;  Location: [facility];  Service: Urology;  Laterality: N/A;  . CYSTOSCOPY WITH BIOPSY N/A 12/01/2014   Procedure: CYSTOSCOPY WITH BIOPSY/FULGURATION;  Surgeon: Malka So, MD;  Location: [facility];  Service: Urology;  Laterality: N/A;  . CYSTOSCOPY WITH RETROGRADE PYELOGRAM, URETEROSCOPY AND STENT PLACEMENT Right 05/16/2016   Procedure: CYSTOSCOPY WITH RIGHT RETROGRADE PYELOGRAM, URETEROSCOPY WITH LASER AND STENT PLACEMENT;  Surgeon: Irine Seal, MD;  Location: [facility];  Service: Urology;  Laterality: Right;  . CYSTOSCOPY WITH STENT PLACEMENT Left 12/04/2017   Procedure: CYSTOSCOPY WITH STENT PLACEMENT;  Surgeon: Irine Seal, MD;  Location: [facility];  Service: Urology;  Laterality: Left;  . CYSTOSCOPY/URETEROSCOPY/HOLMIUM LASER/STENT PLACEMENT Left 12/30/2017   Procedure: CYSTOSCOPY LEFT URETEROSCOPY WITH HOLMIUM LASER AND  LEFT STENT PLACEMENT;  Surgeon: Irine Seal, MD;  Location: [facility];  Service: Urology;  Laterality: Left;  . EXTRACORPOREAL SHOCK WAVE LITHOTRIPSY Left 09-16-2013  . EXTRACORPOREAL SHOCK WAVE LITHOTRIPSY Left 12/08/2017   Procedure: LEFT EXTRACORPOREAL SHOCK WAVE LITHOTRIPSY (ESWL);  Surgeon: Franchot Gallo, MD;  Location: [facility];  Service: Urology;  Laterality: Left;  . HOLMIUM LASER APPLICATION Right 05/14/3558   Procedure: HOLMIUM LASER APPLICATION;  Surgeon: Irine Seal, MD;  Location: [facility];  Service: Urology;  Laterality: Right;  . INGUINAL HERNIA REPAIR Left 11-03-2003  . [place] SURGERY  1995  . LUMBAR LAMINECTOMY  04-25-2011  . LUMBAR LAMINECTOMY/DECOMPRESSION MICRODISCECTOMY Right 11/29/2015   Procedure:  DECOMPRESSION LUMBAR LAMINECTOMY L4-L5  MICRODISCECTOMY L4-L5 LEFT    EXCISION SENOVAL CYST LUMBAR 4-5 RIGHT  PARICAL FACETECTOMY LUMBAR 4-5 RIGHT ;  Surgeon: Latanya Maudlin, MD;  Location: [facility];  Service: Orthopedics;  Laterality: Right;  . PENILE PROSTHESIS IMPLANT N/A 02/16/2013   Procedure: REPLACEMENT OF AMS PENILE PROTHESIS INFLATABLE;  Surgeon: Malka So, MD;  Location: [facility];  Service: Urology;  Laterality: N/A;  . PENILE PROSTHESIS PLACEMENT  2003  . RADICAL PROSTATECTOMY W/ BILATERAL PELVIC LYMPHADENECTOMY  08-07-2000  . RIGHT URETEROSCOPIC LASER LITHOTRIPSY STONE EXTRACTION AND STENT  07-26-2004  . TRANSTHORACIC ECHOCARDIOGRAM  01-21-2010   mild LVH/  ef 60-65%/  trivial TR    Allergies: No Known Allergies  Family History  Problem Relation Age of Onset  . Hypertension Other   . Heart attack Father     Social History:  reports that he quit smoking about 15 years ago. His smoking use included cigarettes. He has a 60.00 pack-year smoking history. he has never used smokeless tobacco. He reports that he drinks about 12.6 oz of alcohol per week. He reports that he does not use drugs.  ROS: A complete review of systems was performed.  All systems are negative except for pertinent findings as noted.  Physical Exam:  Vital signs in last 24 hours: Temp:  [97.9 F (36.6 C)] 97.9 F (36.6 C) (02/11 1551) Pulse Rate:  [101] 101 (02/11 1551) BP: (139)/(95) 139/95 (02/11 1551) SpO2:  [99 %] 99 % (02/11 1551) Weight:  [97.5 kg (215 lb)] 97.5 kg (215 lb) (02/11 1551) Constitutional:  Alert and oriented, No acute distress Cardiovascular: Regular rate and rhythm, No JVD Respiratory: Normal respiratory effort, Lungs clear bilaterally GI: Abdomen is soft, nontender, nondistended, no abdominal masses GU: Left CVA tenderness Lymphatic: No lymphadenopathy Neurologic: Grossly intact, no focal deficits Psychiatric: Normal mood and affect  Laboratory Data:  No results for input(s): WBC, HGB, HCT, PLT in the last 72  hours.  No results for input(s): NA, K, CL, GLUCOSE, BUN, CALCIUM, CREATININE in the last 72 hours.  Invalid input(s): CO3   No results found for this or any previous visit (from the past 24 hour(s)). No results found for this or any previous visit (from the past 240 hour(s)).  Renal Function: Recent Labs    12/30/17 0649  CREATININE 1.30*   Estimated Creatinine Clearance: 62.1 mL/min (A) (by C-G formula based on SCr of 1.3 mg/dL (H)).  Radiologic Imaging: No results found.  I independently reviewed the above imaging studies.  Assessment and Plan AVID GUILLETTE is a 72 y.o. male with left hydronephrosis and acute on chronic renal failure   -The risks, benefits and alternatives of cystoscopy with left retrograde pyelogram and left JJ stent placement was discussed with the patient.  He voices understanding and wishes to proceed.  Bryan Hughs, MD 01/05/2018, 4:09 PM  [facility] Pager: [phone]

## 2018-01-05 NOTE — Anesthesia Postprocedure Evaluation (Signed)
Anesthesia Post Note  Patient: [name]  Procedure(s) Performed: CYSTOSCOPY WITH LEFT RETROGRADE PYELOGRAM/ LEFT URETEROSCOPY/STONE BASKET REMOVAL/ LEFT URETERAL STENT PLACEMENT (Left Ureter)     Patient location during evaluation: PACU Anesthesia Type: General Level of consciousness: awake and alert Pain management: pain level controlled Vital Signs Assessment: post-procedure vital signs reviewed and stable Respiratory status: spontaneous breathing, nonlabored ventilation, respiratory function stable and patient connected to nasal cannula oxygen Cardiovascular status: blood pressure returned to baseline and stable Postop Assessment: no apparent nausea or vomiting Anesthetic complications: no    Last Vitals:  Vitals:   01/05/18 1551 01/05/18 1923  BP: (!) 139/95 (!) 151/90  Pulse: (!) 101 93  Temp: 36.6 C 37 C  SpO2: 99% 100%    Last Pain:  Vitals:   01/05/18 1923  TempSrc:   PainSc: 0-No pain                 Audry Pili

## 2018-01-05 NOTE — Anesthesia Procedure Notes (Signed)
Procedure Name: LMA Insertion Performed by: West Pugh, CRNA Pre-anesthesia Checklist: Patient identified, Emergency Drugs available, Suction available, Patient being monitored and Timeout performed Patient Re-evaluated:Patient Re-evaluated prior to induction Oxygen Delivery Method: Circle system utilized Preoxygenation: Pre-oxygenation with 100% oxygen Induction Type: IV induction Ventilation: Mask ventilation without difficulty LMA: LMA with gastric port inserted LMA Size: 4.0 Number of attempts: 2 Placement Confirmation: positive ETCO2 and CO2 detector Tube secured with: Tape Dental Injury: Teeth and Oropharynx as per pre-operative assessment

## 2018-01-05 NOTE — Op Note (Signed)
Operative Note  Preoperative diagnosis:  1.  Left hydronephrosis 2.  Acute renal failure  Postoperative diagnosis: 1.  Left hydronephrosis secondary to left distal ureteral Stein[address] 2.  Acute renal failure  Procedure(s): 1.  Cystoscopy 2.  Left retrograde pyelogram 3.  Left ureteroscopy 4.  Basket stone extraction 5.  Left JJ stent placement  Surgeon: Ellison Hughs, MD  Assistants: None  Anesthesia: General LMA  Complications: None  EBL: Less than 5 mL  Specimens: 1.  Numerous left ureteral stones  Drains/Catheters: 1.  6 French left JJ stent without tether  Intraoperative findings:   1.  Left ureteral Steinstrasse  Indication:  [name] is a 72 y.o. male who presented to the clinic today with a 2-3-day history of worsening left-sided flank pain associated nausea/vomiting following left JJ stent removal on 01/02/2018.  He is status post left ureteroscopy on 12/30/2017 for multiple large left renal stones.  He had a ultrasound in the office today that demonstrated moderate to severe left hydronephrosis and his serum creatinine was found to be 3.6.  He has a history of a solitary left kidney.  He has been consented for the above procedures, voices understanding and wishes to proceed.  Description of procedure:  After informed consent was obtained, the patient was brought to the operating room and general LMA anesthesia was administered. The patient was then placed in the dorsolithotomy position and prepped and draped in usual sterile fashion. A timeout was performed. A 21 French rigid cystoscope was then inserted into the urethral meatus and advanced into the bladder under direct vision. A complete bladder survey revealed no intravesical pathology.  An attempt was made to advance a sensor wire up the left ureter, but there was an obvious distal obstruction and small stone fragments were seen within the lumen of the left distal ureter.  A zip wire was eventually  manipulated beyond the level of left distal ureteral obstruction advanced up to the left renal pelvis, under fluoroscopic guidance.  A open-ended ureteral catheter was then advanced over the wire and into position within the left distal ureter.  A left retrograde pyelogram was obtained that showed uniform left ureteral hydronephrosis with no other obvious filling defects within the lumen of the proximal left ureter or within the left renal pelvis.  The zip wire was then replaced within the left renal pelvis, under fluoroscopic guidance.    The rigid cystoscope was then exchanged for a semirigid ureteroscope.  The ureteroscope was then advanced alongside the zip wire and into the distal aspects of the left ureter were numerous stones were identified and subsequently basketed out of the lumen of the left ureter.  The cystoscope was then advanced up to the left UPJ with no other signs of obstructing stones.  The ureteroscope was then removed and exchanged for the cystoscope, which was advanced over the wire.  A 6 [place] JJ stent was then placed over the wire and into good position within the left collecting system, confirming placement by fluoroscopy.  All stone fragments were then drained from the bladder through the sheath of the cystoscope.  The patient tolerated the procedure well and was transferred to the postanesthesia unit in stable condition.  Plan: We will monitor the patient overnight for IVF and recheck labs in the morning.  Cc: Irine Seal

## 2018-01-06 ENCOUNTER — Encounter (HOSPITAL_COMMUNITY): Payer: Self-pay | Admitting: Urology

## 2018-01-06 DIAGNOSIS — N132 Hydronephrosis with renal and ureteral calculous obstruction: Secondary | ICD-10-CM | POA: Diagnosis present

## 2018-01-06 DIAGNOSIS — N209 Urinary calculus, unspecified: Secondary | ICD-10-CM | POA: Diagnosis not present

## 2018-01-06 LAB — BASIC METABOLIC PANEL
ANION GAP: 11 (ref 5–15)
BUN: 41 mg/dL — ABNORMAL HIGH (ref 6–20)
CALCIUM: 8.9 mg/dL (ref 8.9–10.3)
CO2: 23 mmol/L (ref 22–32)
CREATININE: 2.32 mg/dL — AB (ref 0.61–1.24)
Chloride: 103 mmol/L (ref 101–111)
GFR, EST AFRICAN AMERICAN: 31 mL/min — AB (ref >60)
GFR, EST NON AFRICAN AMERICAN: 27 mL/min — AB (ref >60)
Glucose, Bld: 198 mg/dL — ABNORMAL HIGH (ref 65–99)
Potassium: 4.8 mmol/L (ref 3.5–5.1)
SODIUM: 137 mmol/L (ref 135–145)

## 2018-01-06 MED ORDER — NITROGLYCERIN 0.4 MG SL SUBL: 0.4000 mg | SUBLINGUAL_TABLET | SUBLINGUAL | Status: DC | PRN

## 2018-01-06 MED ORDER — METOPROLOL TARTRATE 25 MG PO TABS: 12.5000 mg | ORAL_TABLET | Freq: Every evening | ORAL | Status: DC

## 2018-01-06 MED ORDER — ATORVASTATIN CALCIUM 40 MG PO TABS: 40.0000 mg | ORAL_TABLET | Freq: Every evening | ORAL | Status: DC

## 2018-01-06 MED ORDER — GABAPENTIN 300 MG PO CAPS: 300.0000 mg | ORAL_CAPSULE | Freq: Every day | ORAL | Status: DC

## 2018-01-06 NOTE — Discharge Summary (Signed)
Physician Discharge Summary  Patient ID: [name] MRN: [id number] DOB/AGE: December 24, 1945 72 y.o.  Admit date: 01/05/2018 Discharge date: 01/06/2018  Admission Diagnoses:  Ureteral stone with hydronephrosis  Discharge Diagnoses:  Principal Problem:   Ureteral stone with hydronephrosis Active Problems:   RENAL INSUFFICIENCY, CHRONIC   Acute renal failure (ARF) (HCC)   Past Medical History:  Diagnosis Date  . Atrophy of right kidney   . CKD (chronic kidney disease), stage III ([place])   . CORONARY ARTERY DISEASE CARDIOLOGIST--  DR WALL AND DR Angelena Form----   currently under [facility] in [place]   ETT  05-06-2014,  no ischemia or st changes;  low risk nuclear study 04-23-2011--- a. s/p post wall MI  2004 => overlapping stents in the CFX (one Zeta stent and one Taxus DES);  b. [place] => LHC 2/11: Normal LM, normal LAD, normal Dx, CFX 80% ISR, PDA 30-40%, EF 55%. PCI: Cutting balloon angioplasty to the CFX ISR;  c.  Lex MV 5/12: inf and IL defect c/w prior MI, no ischemia, EF 54%  . Cystitis, radiation    2013 following salvage therapy for recurrent prostate cancer post prostatectomy   . DDD (degenerative disc disease), lumbar   . Diverticulosis of colon   . GOUT 06/14/2008  . H/O agent Orange exposure   . History of bladder cancer 2016   urologsit-  dr Jeffie Pollock  . History of kidney stones   . History of myocardial infarction    04-29-2003  -- posterior and lateral wall MI  s/p  stent x2 to  left mid CFX  . History of recurrent UTIs    chronic  . Hyperlipidemia   . Hypertension   . Nephrolithiasis    bilateral  . Nocturia   . Normal exercise tolerance test results    05-06-2014  Negative for ischemia GXT with the pt exercising to a 10 met work load and 89% APMHR without new diagnotic changes of ischemia  . OA (osteoarthritis)    neck and shoulders  . Organic impotence    arterial insuff.  [name] Kitchen PONV (postoperative nausea and vomiting)   . Recurrent prostate adenocarcinoma  [facility]) urologist--  dr Irine Seal---- per pt last PSA 0.12 as of 12-02-2017   first dx  2001,  T2b  N0  M0,  Gleason 6--  s/p  radical prostatectomy/   recurrent 11/ 2012  s/p salvage radiation   . Rosacea   . S/P coronary artery stent placement    BMS x1  and DES x1  to mid left CFX  04-29-2003  . S/P radiation therapy 10/02/11 - 11/25/11   Central Lower Pelvis: 4585 cGy/38 Fractions--  for recurrent prostate cancer  . Spinal stenosis   . SUI (stress urinary incontinence), male   . Wears glasses     Surgeries: Procedure(s): CYSTOSCOPY WITH LEFT RETROGRADE PYELOGRAM/ LEFT URETEROSCOPY/STONE BASKET REMOVAL/ LEFT URETERAL STENT PLACEMENT on 01/05/2018   Consultants (if any):   Discharged Condition: Improved  Hospital Course: [name] is an 72 y.o. male who was admitted 01/05/2018 with a diagnosis of Ureteral stone with hydronephrosis and went to the operating room on 01/05/2018 and underwent the above named procedures.  He did well post op with a decline in his cr to 2.32 at discharge.   He was given perioperative antibiotics:  Anti-infectives (From admission, onward)   Start     Dose/Rate Route Frequency Ordered Stop   01/05/18 1545  ceFAZolin (ANCEF) IVPB 2g/100 mL premix  2 g 200 mL/hr over 30 Minutes Intravenous  Once 01/05/18 1542 01/05/18 1920    .  He was given sequential compression devices and early ambulation, for DVT prophylaxis.  He benefited maximally from the hospital stay and there were no complications.    Recent vital signs:  Vitals:   01/06/18 0454 01/06/18 0900  BP: 122/72 137/79  Pulse: 66 84  Resp: 15   Temp: 98 F (36.7 C) 98 F (36.7 C)  SpO2: 93% 100%    Recent laboratory studies:  Lab Results  Component Value Date   HGB 16.6 01/05/2018   HGB 15.6 12/30/2017   HGB 15.6 12/04/2017   Lab Results  Component Value Date   WBC 13.4 (H) 01/05/2018   PLT 147 (L) 01/05/2018   Lab Results  Component Value Date   INR 1.01 11/15/2015    Lab Results  Component Value Date   NA 137 01/06/2018   K 4.8 01/06/2018   CL 103 01/06/2018   CO2 23 01/06/2018   BUN 41 (H) 01/06/2018   CREATININE 2.32 (H) 01/06/2018   GLUCOSE 198 (H) 01/06/2018    Discharge Medications:   Allergies as of 01/06/2018   No Known Allergies     Medication List    TAKE these medications   allopurinol 300 MG tablet Commonly known as:  ZYLOPRIM Take 300 mg by mouth at bedtime.   aspirin EC 81 MG tablet Take 81 mg by mouth daily.   atorvastatin 80 MG tablet Commonly known as:  LIPITOR Take 40 mg by mouth every evening.   gabapentin 300 MG capsule Commonly known as:  NEURONTIN Take 300 mg by mouth at bedtime.   metoprolol tartrate 25 MG tablet Commonly known as:  LOPRESSOR Take 12.5 mg by mouth every evening.   nitroGLYCERIN 0.4 MG SL tablet Commonly known as:  NITROSTAT Place 1 tablet (0.4 mg total) under the tongue every 5 (five) minutes as needed for chest pain.   traMADol 50 MG tablet Commonly known as:  ULTRAM Take 1 tablet (50 mg total) by mouth every 6 (six) hours as needed.       Diagnostic Studies: Dg Abd 1 View  Result Date: 12/08/2017 CLINICAL DATA:  Nephrolithiasis EXAM: ABDOMEN - 1 VIEW COMPARISON:  October 01, 2017 and December 04, 2017 FINDINGS: Double-J stent extends from the level of L1 on the left to the bladder. There is a calculus in the mid left kidney measuring 1.0 x 0.6 cm. There is a calculus in the lower pole left kidney measuring 1.4 x 1.1 cm. No other abnormal calcifications are identified. There is moderate stool in the colon. There is no bowel dilatation or air-fluid level to suggest bowel obstruction. No free air. IMPRESSION: Left renal calculi.  Double-J stent on the left. No bowel obstruction or free air.  Moderate stool in colon. Electronically Signed   By: Lowella Grip III M.D.   On: 12/08/2017 07:31   Dg C-arm 1-60 Min-no Report  Result Date: 01/05/2018 Fluoroscopy was utilized by the  requesting physician.  No radiographic interpretation.    Disposition: 01-Home or Self Care  Discharge Instructions    Discontinue IV   Complete by:  As directed       Follow-up Information    Irine Seal, MD On 01/13/2018.   Specialty:  Urology Why:  I will have the office call with a time.  Contact information: [place] [address] [phone]  Signed: Irine Seal 01/06/2018, 7:07 PM

## 2018-01-06 NOTE — Progress Notes (Signed)
Pt discharge to home with wife, instruction reviewed acknowledge understanding. Pt in stable condition, urine clear. SRP, RN

## 2018-01-06 NOTE — Discharge Instructions (Signed)

## 2018-01-08 LAB — STONE ANALYSIS
CA PHOS CRY STONE QL IR: 7 %
Ca Oxalate,Dihydrate: 2 %
Ca Oxalate,Monohydr.: 91 %
STONE WEIGHT KSTONE: 28 mg

## 2018-09-20 DIAGNOSIS — J0100 Acute maxillary sinusitis, unspecified: Secondary | ICD-10-CM | POA: Diagnosis not present

## 2018-11-11 DIAGNOSIS — J069 Acute upper respiratory infection, unspecified: Secondary | ICD-10-CM | POA: Diagnosis not present

## 2018-12-06 DIAGNOSIS — J069 Acute upper respiratory infection, unspecified: Secondary | ICD-10-CM | POA: Diagnosis not present

## 2019-06-28 ENCOUNTER — Other Ambulatory Visit: Payer: Self-pay

## 2019-08-04 DIAGNOSIS — Z23 Encounter for immunization: Secondary | ICD-10-CM | POA: Diagnosis not present

## 2019-12-24 ENCOUNTER — Ambulatory Visit: Payer: No Typology Code available for payment source

## 2020-01-01 ENCOUNTER — Ambulatory Visit: Payer: Medicare Other | Attending: Internal Medicine

## 2020-01-01 DIAGNOSIS — Z23 Encounter for immunization: Secondary | ICD-10-CM | POA: Insufficient documentation

## 2020-01-01 NOTE — Progress Notes (Signed)
   Covid-19 Vaccination Clinic  Name:  [name]    MRN: [id number] DOB: May 12, 1946  01/01/2020  Mr. [name] was observed post Covid-19 immunization for 15 minutes without incidence. He was provided with Vaccine Information Sheet and instruction to access the V-Safe system.   Mr. [name] was instructed to call 911 with any severe reactions post vaccine: [name] Kitchen Difficulty breathing  . Swelling of your face and throat  . A fast heartbeat  . A bad rash all over your body  . Dizziness and weakness    Immunizations Administered    Name Date Dose VIS Date Route   Pfizer COVID-19 Vaccine 01/01/2020  3:24 PM 0.3 mL 11/05/2019 Intramuscular   Manufacturer: [place]   Lot: [id number]   [name]: [id number]

## 2020-01-04 ENCOUNTER — Ambulatory Visit: Payer: No Typology Code available for payment source

## 2020-01-26 ENCOUNTER — Ambulatory Visit: Payer: Medicare Other | Attending: Internal Medicine

## 2020-01-26 DIAGNOSIS — Z23 Encounter for immunization: Secondary | ICD-10-CM | POA: Insufficient documentation

## 2020-01-26 NOTE — Progress Notes (Signed)
   Covid-19 Vaccination Clinic  Name:  [name]    MRN: [id number] DOB: 1946/09/09  01/26/2020  [name] was observed post Covid-19 immunization for 15 minutes without incident. He was provided with Vaccine Information Sheet and instruction to access the V-Safe system.   [name] was instructed to call 911 with any severe reactions post vaccine: [name] Kitchen Difficulty breathing  . Swelling of face and throat  . A fast heartbeat  . A bad rash all over body  . Dizziness and weakness   Immunizations Administered    Name Date Dose VIS Date Route   Pfizer COVID-19 Vaccine 01/26/2020 10:41 AM 0.3 mL 11/05/2019 Intramuscular   Manufacturer: [place]   Lot: [id number]   [place]: [id number]

## 2020-07-22 DIAGNOSIS — R05 Cough: Secondary | ICD-10-CM | POA: Diagnosis not present

## 2020-07-22 DIAGNOSIS — U071 COVID-19: Secondary | ICD-10-CM | POA: Diagnosis not present

## 2020-07-22 DIAGNOSIS — Z9189 Other specified personal risk factors, not elsewhere classified: Secondary | ICD-10-CM | POA: Diagnosis not present

## 2020-08-08 ENCOUNTER — Ambulatory Visit: Payer: Medicare Other | Attending: Internal Medicine

## 2020-08-08 DIAGNOSIS — Z23 Encounter for immunization: Secondary | ICD-10-CM | POA: Diagnosis not present

## 2020-08-08 NOTE — Progress Notes (Signed)
   Covid-19 Vaccination Clinic  Name:  [name]    MRN: [id number] DOB: [birth year]  08/08/2020  [name] was observed post Covid-19 immunization for 15 minutes without incident. He was provided with Vaccine Information Sheet and instruction to access the V-Safe system.   [name] was instructed to call 911 with any severe reactions post vaccine: [name] Kitchen Difficulty breathing  . Swelling of face and throat  . A fast heartbeat  . A bad rash all over body  . Dizziness and weakness

## 2021-09-12 ENCOUNTER — Other Ambulatory Visit (HOSPITAL_BASED_OUTPATIENT_CLINIC_OR_DEPARTMENT_OTHER): Payer: Self-pay

## 2021-09-12 ENCOUNTER — Ambulatory Visit: Payer: Medicare HMO | Attending: Internal Medicine

## 2021-09-12 DIAGNOSIS — Z23 Encounter for immunization: Secondary | ICD-10-CM

## 2021-09-12 MED ORDER — PFIZER COVID-19 VAC BIVALENT 30 MCG/0.3ML IM SUSP
INTRAMUSCULAR | 0 refills | Status: AC
  Filled 2021-09-12: qty 0.3, 1d supply, fill #0

## 2021-09-12 NOTE — Progress Notes (Signed)
   Covid-19 Vaccination Clinic  Name:  [name]    MRN: [id number] DOB: [birth year]  09/12/2021  [name] was observed post Covid-19 immunization for 15 minutes without incident. He was provided with Vaccine Information Sheet and instruction to access the V-Safe system.   [name] was instructed to call 911 with any severe reactions post vaccine: Difficulty breathing  Swelling of face and throat  A fast heartbeat  A bad rash all over body  Dizziness and weakness   Immunizations Administered     Name Date Dose VIS Date Route   Pfizer Covid-19 Vaccine Bivalent Booster 09/12/2021  1:56 PM 0.3 mL 07/25/2021 Intramuscular   Manufacturer: [place]   Lot: [id number]   [place]: [phone]

## 2022-04-29 DIAGNOSIS — R051 Acute cough: Secondary | ICD-10-CM | POA: Diagnosis not present

## 2022-04-29 DIAGNOSIS — J019 Acute sinusitis, unspecified: Secondary | ICD-10-CM | POA: Diagnosis not present

## 2022-05-29 ENCOUNTER — Emergency Department (HOSPITAL_COMMUNITY): Payer: No Typology Code available for payment source

## 2022-05-29 ENCOUNTER — Emergency Department (HOSPITAL_COMMUNITY)
Admission: EM | Admit: 2022-05-29 | Discharge: 2022-05-29 | Disposition: A | Discharge status: Home / Self Care | Payer: No Typology Code available for payment source | Attending: Emergency Medicine | Admitting: Emergency Medicine

## 2022-05-29 ENCOUNTER — Encounter (HOSPITAL_COMMUNITY): Payer: Self-pay

## 2022-05-29 ENCOUNTER — Other Ambulatory Visit: Payer: Self-pay

## 2022-05-29 DIAGNOSIS — Z7982 Long term (current) use of aspirin: Secondary | ICD-10-CM | POA: Insufficient documentation

## 2022-05-29 DIAGNOSIS — R0789 Other chest pain: Secondary | ICD-10-CM | POA: Diagnosis present

## 2022-05-29 DIAGNOSIS — R079 Chest pain, unspecified: Secondary | ICD-10-CM

## 2022-05-29 DIAGNOSIS — Z79899 Other long term (current) drug therapy: Secondary | ICD-10-CM | POA: Diagnosis not present

## 2022-05-29 DIAGNOSIS — K859 Acute pancreatitis without necrosis or infection, unspecified: Secondary | ICD-10-CM | POA: Insufficient documentation

## 2022-05-29 DIAGNOSIS — I700 Atherosclerosis of aorta: Secondary | ICD-10-CM | POA: Diagnosis not present

## 2022-05-29 DIAGNOSIS — R109 Unspecified abdominal pain: Secondary | ICD-10-CM | POA: Diagnosis not present

## 2022-05-29 LAB — HEPATIC FUNCTION PANEL
ALT: 23 U/L (ref 0–44)
AST: 52 U/L — ABNORMAL HIGH (ref 15–41)
Albumin: 4.4 g/dL (ref 3.5–5.0)
Alkaline Phosphatase: 67 U/L (ref 38–126)
Bilirubin, Direct: 0.2 mg/dL (ref 0.0–0.2)
Indirect Bilirubin: 0.9 mg/dL (ref 0.3–0.9)
Total Bilirubin: 1.1 mg/dL (ref 0.3–1.2)
Total Protein: 8.2 g/dL — ABNORMAL HIGH (ref 6.5–8.1)

## 2022-05-29 LAB — BASIC METABOLIC PANEL
Anion gap: 10 (ref 5–15)
BUN: 18 mg/dL (ref 8–23)
CO2: 27 mmol/L (ref 22–32)
Calcium: 10.1 mg/dL (ref 8.9–10.3)
Chloride: 103 mmol/L (ref 98–111)
Creatinine, Ser: 1.3 mg/dL — ABNORMAL HIGH (ref 0.61–1.24)
GFR, Estimated: 57 mL/min — ABNORMAL LOW (ref >60)
Glucose, Bld: 130 mg/dL — ABNORMAL HIGH (ref 70–99)
Potassium: 3.7 mmol/L (ref 3.5–5.1)
Sodium: 140 mmol/L (ref 135–145)

## 2022-05-29 LAB — TROPONIN I (HIGH SENSITIVITY)
Troponin I (High Sensitivity): 4 ng/L
Troponin I (High Sensitivity): 4 ng/L

## 2022-05-29 LAB — CBC
HCT: 52.5 % — ABNORMAL HIGH (ref 39.0–52.0)
Hemoglobin: 17.4 g/dL — ABNORMAL HIGH (ref 13.0–17.0)
MCH: 32.2 pg (ref 26.0–34.0)
MCHC: 33.1 g/dL (ref 30.0–36.0)
MCV: 97.2 fL (ref 80.0–100.0)
Platelets: 141 10*3/uL — ABNORMAL LOW (ref 150–400)
RBC: 5.4 MIL/uL (ref 4.22–5.81)
RDW: 13.4 % (ref 11.5–15.5)
WBC: 7.8 10*3/uL (ref 4.0–10.5)
nRBC: 0 % (ref 0.0–0.2)

## 2022-05-29 LAB — LIPASE, BLOOD: Lipase: 105 U/L — ABNORMAL HIGH (ref 11–51)

## 2022-05-29 MED ORDER — SODIUM CHLORIDE (PF) 0.9 % IJ SOLN
INTRAMUSCULAR | Status: AC
  Filled 2022-05-29: qty 50

## 2022-05-29 MED ORDER — IOHEXOL 300 MG/ML  SOLN
100.0000 mL | Freq: Once | INTRAMUSCULAR | Status: AC | PRN
  Administered 2022-05-29: 100 mL via INTRAVENOUS

## 2022-05-29 MED ORDER — NITROGLYCERIN 0.4 MG SL SUBL: 0.4000 mg | SUBLINGUAL_TABLET | SUBLINGUAL | Status: DC | PRN

## 2022-05-29 MED ORDER — ONDANSETRON HCL 4 MG/2ML IJ SOLN
4.0000 mg | Freq: Once | INTRAMUSCULAR | Status: AC
Start: 2022-05-29 — End: 2022-05-29
  Administered 2022-05-29: 4 mg via INTRAVENOUS
  Filled 2022-05-29: qty 2

## 2022-05-29 MED ORDER — ONDANSETRON 4 MG PO TBDP: 4.0000 mg | ORAL_TABLET | Freq: Three times a day (TID) | ORAL | 0 refills | Status: AC | PRN

## 2022-05-29 MED ORDER — ASPIRIN 81 MG PO CHEW
324.0000 mg | CHEWABLE_TABLET | Freq: Once | ORAL | Status: AC
  Administered 2022-05-29: 324 mg via ORAL
  Filled 2022-05-29: qty 4

## 2022-05-29 NOTE — ED Notes (Signed)
Patient transported to X-ray 

## 2022-05-29 NOTE — ED Triage Notes (Signed)
Pt reports severe chest pain that woke him up around 3am this morning. Pt reports the pain has eased up, but is constant. Pt endorses SHOB and nausea. Hx of MI and cardiac stent placement.

## 2022-05-29 NOTE — ED Notes (Signed)
Pt. C/o nausea, provider notified.

## 2022-05-29 NOTE — ED Notes (Signed)
Provider is aware of BP @ 0945. Provider is @ bedside

## 2022-05-29 NOTE — ED Provider Notes (Signed)
[facility] DEPT Provider Note   CSN: [id number] Arrival date & time: 05/29/22  4098     History  Chief Complaint  Patient presents with   Chest Pain    [name] is a 76 y.o. male.   Chest Pain Patient presents with anterior lower chest pain.  Left-sided.  Worse with certain positions.  Worse with laying back.  Worse with exertion.  Does have previous coronary artery disease with 2 stents proximately 20 years ago.  Was doing fine yesterday.  Woke with the pain around 3 in the morning.  Does have nausea.  States it does hurt a little bit to breathe also.  No vomiting.  No swelling in his legs.  States he went for a walk after the pain started and pain got worse.  Gets seen primarily at the [place].  Recently started on prednisone and some hormone treatment. It appears the medcine was abiraterone     Home Medications Prior to Admission medications   Medication Sig Start Date End Date Taking? Authorizing Provider  abiraterone acetate (ZYTIGA) 250 MG tablet Take 1,000 mg by mouth daily. Take on an empty stomach   Yes [provider]  acetaminophen (TYLENOL) 500 MG tablet Take 1,000 mg by mouth daily as needed for headache.   Yes [provider]  allopurinol (ZYLOPRIM) 300 MG tablet Take 300 mg by mouth daily.   Yes [provider]  aspirin EC 81 MG tablet Take 81 mg by mouth daily.   Yes [provider]  atorvastatin (LIPITOR) 80 MG tablet Take 40 mg by mouth daily.   Yes [provider]  Cholecalciferol (VITAMIN D3) 25 MCG (1000 UT) CAPS Take 1,000 Units by mouth daily.   Yes [provider]  gabapentin (NEURONTIN) 300 MG capsule Take 600 mg by mouth daily.   Yes [provider]  Multiple Vitamin (MULTIVITAMIN PO) Take 1 tablet by mouth daily.   Yes [provider]  nitroGLYCERIN (NITROSTAT) 0.4 MG SL tablet Place 1 tablet (0.4 mg total) under the tongue every 5 (five) minutes as  needed for chest pain. 10/06/13  Yes Plotnikov, Evie Lacks, MD  Omega-3 Fatty Acids (FISH OIL) 1200 MG CAPS Take 1,200 mg by mouth daily.   Yes [provider]  ondansetron (ZOFRAN-ODT) 4 MG disintegrating tablet Take 1 tablet (4 mg total) by mouth every 8 (eight) hours as needed for nausea or vomiting. 05/29/22  Yes Davonna Belling, MD  OVER THE COUNTER MEDICATION Take 750 mg by mouth daily. Osteo Bi Flex Glucosamin   Yes [provider]  OVER THE COUNTER MEDICATION Take 1 tablet by mouth daily. Calcium 1200 mg + D3 25 mcg (2000 units)   Yes [provider]  predniSONE (DELTASONE) 5 MG tablet Take 5 mg by mouth daily.   Yes [provider]  triamcinolone cream (KENALOG) 0.1 % Apply 1 Application topically daily.   Yes [provider]  vitamin C (ASCORBIC ACID) 500 MG tablet Take 500 mg by mouth daily.   Yes [provider]  Zinc 50 MG TABS Take 50 mg by mouth daily in the afternoon.   Yes [provider]  COVID-19 mRNA bivalent vaccine, Pfizer, (PFIZER COVID-19 VAC BIVALENT) injection Inject into the muscle. 09/12/21   Carlyle Basques, MD  metoprolol tartrate (LOPRESSOR) 25 MG tablet Take 12.5 mg by mouth every evening.  Patient not taking: Reported on 05/29/2022    [provider]      Allergies  Patient has no known allergies.    Review of Systems   Review of Systems  Cardiovascular:  Positive for chest pain.    Physical Exam Updated Vital Signs BP 119/80 (BP Location: Left Arm)   Pulse 78   Temp 98.7 F (37.1 C) (Oral)   Resp (!) 33   SpO2 96%  Physical Exam Vitals and nursing note reviewed.  HENT:     Head: Normocephalic.  Cardiovascular:     Rate and Rhythm: Regular rhythm.  Pulmonary:     Breath sounds: No decreased breath sounds or wheezing.  Chest:     Chest wall: No tenderness.  Abdominal:     Tenderness: There is no abdominal tenderness.  Musculoskeletal:     Cervical back: Neck supple.      Right lower leg: No edema.     Left lower leg: No edema.  Skin:    General: Skin is warm.     Capillary Refill: Capillary refill takes less than 2 seconds.  Neurological:     Mental Status: He is alert and oriented to person, place, and time.     ED Results / Procedures / Treatments   Labs (all labs ordered are listed, but only abnormal results are displayed) Labs Reviewed  BASIC METABOLIC PANEL - Abnormal; Notable for the following components:      Result Value   Glucose, Bld 130 (*)    Creatinine, Ser 1.30 (*)    GFR, Estimated 57 (*)    All other components within normal limits  CBC - Abnormal; Notable for the following components:   Hemoglobin 17.4 (*)    HCT 52.5 (*)    Platelets 141 (*)    All other components within normal limits  HEPATIC FUNCTION PANEL - Abnormal; Notable for the following components:   Total Protein 8.2 (*)    AST 52 (*)    All other components within normal limits  LIPASE, BLOOD - Abnormal; Notable for the following components:   Lipase 105 (*)    All other components within normal limits  TROPONIN I (HIGH SENSITIVITY)  TROPONIN I (HIGH SENSITIVITY)    EKG EKG Interpretation  Date/Time:  Wednesday May 29 2022 09:19:51 EDT Ventricular Rate:  77 PR Interval:  152 QRS Duration: 98 QT Interval:  382 QTC Calculation: 433 R Axis:   65 Text Interpretation: Sinus rhythm Consider right atrial enlargement Minimal ST depression, anterolateral leads Nonspecific inferior and lateral ST changes since last tracing. Confirmed by Davonna Belling [phone]) on 05/29/2022 9:22:20 AM  Radiology CT ABDOMEN PELVIS W CONTRAST  Result Date: 05/29/2022 CLINICAL DATA:  Left upper quadrant abdominal pain, nausea, shortness of breath EXAM: CT ABDOMEN AND PELVIS WITH CONTRAST TECHNIQUE: Multidetector CT imaging of the abdomen and pelvis was performed using the standard protocol following bolus administration of intravenous contrast. RADIATION DOSE REDUCTION: This exam  was performed according to the departmental dose-optimization program which includes automated exposure control, adjustment of the mA and/or kV according to patient size and/or use of iterative reconstruction technique. CONTRAST:  133m OMNIPAQUE IOHEXOL 300 MG/ML  SOLN COMPARISON:  None Available. FINDINGS: Lower chest: No acute abnormality.  Coronary artery calcifications. Hepatobiliary: No solid liver abnormality is seen. No gallstones, gallbladder wall thickening, or biliary dilatation. Pancreas: Unremarkable. No pancreatic ductal dilatation or surrounding inflammatory changes. Spleen: Normal in size without significant abnormality. Adrenals/Urinary Tract: Stable, definitively benign small fat containing right adrenal adenoma (series 2, image 16). Lobulated bilateral renal cortical scarring, with marked atrophy of the right  kidney. Multiple small nonobstructive left renal calculi. There is a 0.4 cm calculus at the right ureterovesicular junction without significant associated right-sided hydronephrosis or hydroureter (series 2, image 76). Bladder is unremarkable. Stomach/Bowel: Stomach is within normal limits. Appendix is normal. No evidence of bowel wall thickening, distention, or inflammatory changes. Sigmoid diverticulosis. Colon is diffusely fluid-filled. Vascular/Lymphatic: Aortic atherosclerosis. No enlarged abdominal or pelvic lymph nodes. Reproductive: Status post prostatectomy. Penile implant with reservoir within the central pelvis (series 2, image 63). Other: No abdominal wall hernia or abnormality. No ascites. Musculoskeletal: No acute or significant osseous findings. IMPRESSION: 1. No definite acute CT findings to explain left upper quadrant pain. 2. Diffusely fluid-filled colon, suggesting diarrheal illness. 3. There is a 0.4 cm calculus at the right ureterovesicular junction without significant associated right-sided hydronephrosis or hydroureter. Severe atrophy of the right kidney, significantly  progressed compared to prior examination dated 2015, suggests chronic obstruction. 4. Multiple small nonobstructive left renal calculi. 5. Sigmoid diverticulosis without evidence of acute diverticulitis. 6. Status post prostatectomy. Penile implant with reservoir. 7. Coronary artery disease. Aortic Atherosclerosis (ICD10-I70.0). Electronically Signed   By: Delanna Ahmadi M.D.   On: 05/29/2022 11:20   DG Chest 2 View  Result Date: 05/29/2022 CLINICAL DATA:  Chest pain. EXAM: CHEST - 2 VIEW COMPARISON:  Chest x-ray 11/15/2015. FINDINGS: The heart size and mediastinal contours are within normal limits. Both lungs are clear. No visible pleural effusions or pneumothorax. Biapical pleuroparenchymal scarring. No acute osseous abnormality. IMPRESSION: No evidence of acute cardiopulmonary disease. Electronically Signed   By: Margaretha Sheffield M.D.   On: 05/29/2022 09:32    Procedures Procedures    Medications Ordered in ED Medications  nitroGLYCERIN (NITROSTAT) SL tablet 0.4 mg (has no administration in time range)  sodium chloride (PF) 0.9 % injection (has no administration in time range)  aspirin chewable tablet 324 mg (324 mg Oral Given 05/29/22 0948)  ondansetron (ZOFRAN) injection 4 mg (4 mg Intravenous Given 05/29/22 0949)  iohexol (OMNIPAQUE) 300 MG/ML solution 100 mL (100 mLs Intravenous Contrast Given 05/29/22 1054)    ED Course/ Medical Decision Making/ A&P                           Medical Decision Making Amount and/or Complexity of Data Reviewed Labs: ordered. Radiology: ordered.  Risk OTC drugs. Prescription drug management.   Patient presents with today's chest/upper abdominal pain.  Began acutely around 2 in the morning.  Nonspecific EKG changes initially.  Initially hypertensive.  Then had brief episode of hypotension when he was nauseous.  Initially minimal abdominal tenderness but now it is progressed a little to epigastric to left upper quadrant tenderness.  Chest x-ray  independently interpreted and reassuring.  CT scan done to elevated lipase.  Also known stone on the right side.  Doubt infection.  Potentially diarrhea will be starting.  Potentially pancreatitis causing the pain.  Lipase elevated at about 100.  Pain got worse after eating today.  After further discussion with the patient pain did not increase with exertion earlier today.  I think cardiac cause is less likely with 2 negative troponins and potentially another cause with lipase elevation.  Discussed with patient will discharge home.  Follow-up with both the lipase and potentially cardiac cause.  If exertional pain develops will return.  Appears stable for discharge home        Final Clinical Impression(s) / ED Diagnoses Final diagnoses:  Acute pancreatitis, unspecified complication status, unspecified pancreatitis  type  Nonspecific chest pain    Rx / DC Orders ED Discharge Orders          Ordered    ondansetron (ZOFRAN-ODT) 4 MG disintegrating tablet  Every 8 hours PRN        05/29/22 1355              Davonna Belling, MD 05/29/22 1440

## 2022-07-13 DIAGNOSIS — M7712 Lateral epicondylitis, left elbow: Secondary | ICD-10-CM | POA: Diagnosis not present

## 2022-07-13 DIAGNOSIS — S40021A Contusion of right upper arm, initial encounter: Secondary | ICD-10-CM | POA: Diagnosis not present

## 2022-07-15 DIAGNOSIS — I2510 Atherosclerotic heart disease of native coronary artery without angina pectoris: Secondary | ICD-10-CM | POA: Diagnosis not present

## 2022-07-15 DIAGNOSIS — R32 Unspecified urinary incontinence: Secondary | ICD-10-CM | POA: Diagnosis not present

## 2022-07-15 DIAGNOSIS — M109 Gout, unspecified: Secondary | ICD-10-CM | POA: Diagnosis not present

## 2022-07-15 DIAGNOSIS — I252 Old myocardial infarction: Secondary | ICD-10-CM | POA: Diagnosis not present

## 2022-07-15 DIAGNOSIS — E785 Hyperlipidemia, unspecified: Secondary | ICD-10-CM | POA: Diagnosis not present

## 2022-07-15 DIAGNOSIS — I10 Essential (primary) hypertension: Secondary | ICD-10-CM | POA: Diagnosis not present

## 2022-07-15 DIAGNOSIS — C61 Malignant neoplasm of prostate: Secondary | ICD-10-CM | POA: Diagnosis not present

## 2022-07-15 DIAGNOSIS — N529 Male erectile dysfunction, unspecified: Secondary | ICD-10-CM | POA: Diagnosis not present

## 2022-07-15 DIAGNOSIS — Z7952 Long term (current) use of systemic steroids: Secondary | ICD-10-CM | POA: Diagnosis not present

## 2022-09-06 ENCOUNTER — Other Ambulatory Visit: Payer: Self-pay

## 2022-11-04 ENCOUNTER — Inpatient Hospital Stay (HOSPITAL_BASED_OUTPATIENT_CLINIC_OR_DEPARTMENT_OTHER)
Admission: EM | Admit: 2022-11-04 | Discharge: 2022-11-11 | DRG: 871 | Disposition: A | Discharge status: Home Health | Payer: Medicare PPO | Attending: Internal Medicine | Admitting: Internal Medicine

## 2022-11-04 ENCOUNTER — Encounter (HOSPITAL_COMMUNITY): Payer: Self-pay

## 2022-11-04 ENCOUNTER — Emergency Department (HOSPITAL_BASED_OUTPATIENT_CLINIC_OR_DEPARTMENT_OTHER): Payer: Medicare PPO

## 2022-11-04 ENCOUNTER — Other Ambulatory Visit: Payer: Self-pay

## 2022-11-04 ENCOUNTER — Encounter (HOSPITAL_BASED_OUTPATIENT_CLINIC_OR_DEPARTMENT_OTHER): Payer: Self-pay | Admitting: Emergency Medicine

## 2022-11-04 ENCOUNTER — Emergency Department (HOSPITAL_BASED_OUTPATIENT_CLINIC_OR_DEPARTMENT_OTHER): Payer: Medicare PPO | Admitting: Radiology

## 2022-11-04 DIAGNOSIS — C775 Secondary and unspecified malignant neoplasm of intrapelvic lymph nodes: Secondary | ICD-10-CM | POA: Diagnosis present

## 2022-11-04 DIAGNOSIS — C61 Malignant neoplasm of prostate: Secondary | ICD-10-CM | POA: Diagnosis present

## 2022-11-04 DIAGNOSIS — M4800 Spinal stenosis, site unspecified: Secondary | ICD-10-CM | POA: Diagnosis present

## 2022-11-04 DIAGNOSIS — N200 Calculus of kidney: Secondary | ICD-10-CM

## 2022-11-04 DIAGNOSIS — Z79899 Other long term (current) drug therapy: Secondary | ICD-10-CM

## 2022-11-04 DIAGNOSIS — E872 Acidosis, unspecified: Secondary | ICD-10-CM

## 2022-11-04 DIAGNOSIS — Z66 Do not resuscitate: Secondary | ICD-10-CM | POA: Diagnosis present

## 2022-11-04 DIAGNOSIS — R579 Shock, unspecified: Secondary | ICD-10-CM | POA: Diagnosis not present

## 2022-11-04 DIAGNOSIS — Z7982 Long term (current) use of aspirin: Secondary | ICD-10-CM

## 2022-11-04 DIAGNOSIS — I2510 Atherosclerotic heart disease of native coronary artery without angina pectoris: Secondary | ICD-10-CM | POA: Diagnosis present

## 2022-11-04 DIAGNOSIS — Z8551 Personal history of malignant neoplasm of bladder: Secondary | ICD-10-CM

## 2022-11-04 DIAGNOSIS — I951 Orthostatic hypotension: Secondary | ICD-10-CM | POA: Diagnosis not present

## 2022-11-04 DIAGNOSIS — N529 Male erectile dysfunction, unspecified: Secondary | ICD-10-CM | POA: Diagnosis present

## 2022-11-04 DIAGNOSIS — D3501 Benign neoplasm of right adrenal gland: Secondary | ICD-10-CM | POA: Diagnosis present

## 2022-11-04 DIAGNOSIS — Z923 Personal history of irradiation: Secondary | ICD-10-CM

## 2022-11-04 DIAGNOSIS — R0602 Shortness of breath: Secondary | ICD-10-CM | POA: Diagnosis not present

## 2022-11-04 DIAGNOSIS — Z1152 Encounter for screening for COVID-19: Secondary | ICD-10-CM

## 2022-11-04 DIAGNOSIS — N136 Pyonephrosis: Secondary | ICD-10-CM | POA: Diagnosis present

## 2022-11-04 DIAGNOSIS — R739 Hyperglycemia, unspecified: Secondary | ICD-10-CM | POA: Diagnosis present

## 2022-11-04 DIAGNOSIS — Z87891 Personal history of nicotine dependence: Secondary | ICD-10-CM | POA: Diagnosis not present

## 2022-11-04 DIAGNOSIS — K760 Fatty (change of) liver, not elsewhere classified: Secondary | ICD-10-CM | POA: Diagnosis present

## 2022-11-04 DIAGNOSIS — R6521 Severe sepsis with septic shock: Secondary | ICD-10-CM | POA: Diagnosis present

## 2022-11-04 DIAGNOSIS — Z7952 Long term (current) use of systemic steroids: Secondary | ICD-10-CM

## 2022-11-04 DIAGNOSIS — K5900 Constipation, unspecified: Secondary | ICD-10-CM | POA: Diagnosis present

## 2022-11-04 DIAGNOSIS — I129 Hypertensive chronic kidney disease with stage 1 through stage 4 chronic kidney disease, or unspecified chronic kidney disease: Secondary | ICD-10-CM | POA: Diagnosis present

## 2022-11-04 DIAGNOSIS — N201 Calculus of ureter: Secondary | ICD-10-CM | POA: Diagnosis not present

## 2022-11-04 DIAGNOSIS — U071 COVID-19: Secondary | ICD-10-CM | POA: Diagnosis not present

## 2022-11-04 DIAGNOSIS — E785 Hyperlipidemia, unspecified: Secondary | ICD-10-CM | POA: Diagnosis present

## 2022-11-04 DIAGNOSIS — R519 Headache, unspecified: Secondary | ICD-10-CM | POA: Diagnosis not present

## 2022-11-04 DIAGNOSIS — Z955 Presence of coronary angioplasty implant and graft: Secondary | ICD-10-CM

## 2022-11-04 DIAGNOSIS — N3040 Irradiation cystitis without hematuria: Secondary | ICD-10-CM | POA: Diagnosis present

## 2022-11-04 DIAGNOSIS — N1831 Chronic kidney disease, stage 3a: Secondary | ICD-10-CM | POA: Diagnosis present

## 2022-11-04 DIAGNOSIS — N179 Acute kidney failure, unspecified: Secondary | ICD-10-CM | POA: Diagnosis present

## 2022-11-04 DIAGNOSIS — I4711 Inappropriate sinus tachycardia, so stated: Secondary | ICD-10-CM | POA: Diagnosis not present

## 2022-11-04 DIAGNOSIS — Y842 Radiological procedure and radiotherapy as the cause of abnormal reaction of the patient, or of later complication, without mention of misadventure at the time of the procedure: Secondary | ICD-10-CM | POA: Diagnosis present

## 2022-11-04 DIAGNOSIS — R197 Diarrhea, unspecified: Secondary | ICD-10-CM | POA: Diagnosis present

## 2022-11-04 DIAGNOSIS — N130 Hydronephrosis with ureteropelvic junction obstruction: Secondary | ICD-10-CM | POA: Diagnosis not present

## 2022-11-04 DIAGNOSIS — E876 Hypokalemia: Secondary | ICD-10-CM | POA: Diagnosis not present

## 2022-11-04 DIAGNOSIS — M109 Gout, unspecified: Secondary | ICD-10-CM | POA: Diagnosis present

## 2022-11-04 DIAGNOSIS — Z936 Other artificial openings of urinary tract status: Secondary | ICD-10-CM | POA: Diagnosis not present

## 2022-11-04 DIAGNOSIS — E8721 Acute metabolic acidosis: Secondary | ICD-10-CM | POA: Diagnosis present

## 2022-11-04 DIAGNOSIS — A4153 Sepsis due to Serratia: Secondary | ICD-10-CM | POA: Diagnosis present

## 2022-11-04 DIAGNOSIS — Z6830 Body mass index (BMI) 30.0-30.9, adult: Secondary | ICD-10-CM

## 2022-11-04 DIAGNOSIS — N132 Hydronephrosis with renal and ureteral calculous obstruction: Secondary | ICD-10-CM | POA: Diagnosis not present

## 2022-11-04 DIAGNOSIS — R7881 Bacteremia: Secondary | ICD-10-CM

## 2022-11-04 DIAGNOSIS — N12 Tubulo-interstitial nephritis, not specified as acute or chronic: Secondary | ICD-10-CM

## 2022-11-04 DIAGNOSIS — E440 Moderate protein-calorie malnutrition: Secondary | ICD-10-CM | POA: Insufficient documentation

## 2022-11-04 DIAGNOSIS — N111 Chronic obstructive pyelonephritis: Secondary | ICD-10-CM

## 2022-11-04 DIAGNOSIS — Z8249 Family history of ischemic heart disease and other diseases of the circulatory system: Secondary | ICD-10-CM

## 2022-11-04 DIAGNOSIS — I252 Old myocardial infarction: Secondary | ICD-10-CM

## 2022-11-04 DIAGNOSIS — M6281 Muscle weakness (generalized): Secondary | ICD-10-CM | POA: Diagnosis not present

## 2022-11-04 DIAGNOSIS — N138 Other obstructive and reflux uropathy: Secondary | ICD-10-CM | POA: Diagnosis not present

## 2022-11-04 DIAGNOSIS — A498 Other bacterial infections of unspecified site: Secondary | ICD-10-CM | POA: Diagnosis not present

## 2022-11-04 DIAGNOSIS — I700 Atherosclerosis of aorta: Secondary | ICD-10-CM | POA: Diagnosis present

## 2022-11-04 DIAGNOSIS — A419 Sepsis, unspecified organism: Secondary | ICD-10-CM | POA: Diagnosis present

## 2022-11-04 DIAGNOSIS — G9341 Metabolic encephalopathy: Secondary | ICD-10-CM | POA: Diagnosis present

## 2022-11-04 DIAGNOSIS — R109 Unspecified abdominal pain: Secondary | ICD-10-CM | POA: Diagnosis not present

## 2022-11-04 DIAGNOSIS — Z981 Arthrodesis status: Secondary | ICD-10-CM

## 2022-11-04 DIAGNOSIS — N1330 Unspecified hydronephrosis: Secondary | ICD-10-CM | POA: Diagnosis not present

## 2022-11-04 LAB — COMPREHENSIVE METABOLIC PANEL
ALT: 14 U/L (ref 0–44)
AST: 40 U/L (ref 15–41)
Albumin: 4.1 g/dL (ref 3.5–5.0)
Alkaline Phosphatase: 49 U/L (ref 38–126)
Anion gap: 18 — ABNORMAL HIGH (ref 5–15)
BUN: 35 mg/dL — ABNORMAL HIGH (ref 8–23)
CO2: 23 mmol/L (ref 22–32)
Calcium: 9.4 mg/dL (ref 8.9–10.3)
Chloride: 99 mmol/L (ref 98–111)
Creatinine, Ser: 3.92 mg/dL — ABNORMAL HIGH (ref 0.61–1.24)
GFR, Estimated: 15 mL/min — ABNORMAL LOW (ref >60)
Glucose, Bld: 150 mg/dL — ABNORMAL HIGH (ref 70–99)
Potassium: 3.7 mmol/L (ref 3.5–5.1)
Sodium: 140 mmol/L (ref 135–145)
Total Bilirubin: 1.3 mg/dL — ABNORMAL HIGH (ref 0.3–1.2)
Total Protein: 6.9 g/dL (ref 6.5–8.1)

## 2022-11-04 LAB — CBC WITH DIFFERENTIAL/PLATELET
Abs Immature Granulocytes: 0.22 10*3/uL — ABNORMAL HIGH (ref 0.00–0.07)
Basophils Absolute: 0.1 10*3/uL (ref 0.0–0.1)
Basophils Relative: 0 %
Eosinophils Absolute: 0 10*3/uL (ref 0.0–0.5)
Eosinophils Relative: 0 %
HCT: 41.9 % (ref 39.0–52.0)
Hemoglobin: 14 g/dL (ref 13.0–17.0)
Immature Granulocytes: 1 %
Lymphocytes Relative: 6 %
Lymphs Abs: 1.2 10*3/uL (ref 0.7–4.0)
MCH: 32.9 pg (ref 26.0–34.0)
MCHC: 33.4 g/dL (ref 30.0–36.0)
MCV: 98.6 fL (ref 80.0–100.0)
Monocytes Absolute: 0.6 10*3/uL (ref 0.1–1.0)
Monocytes Relative: 3 %
Neutro Abs: 19 10*3/uL — ABNORMAL HIGH (ref 1.7–7.7)
Neutrophils Relative %: 90 %
Platelets: 129 10*3/uL — ABNORMAL LOW (ref 150–400)
RBC: 4.25 MIL/uL (ref 4.22–5.81)
RDW: 13.2 % (ref 11.5–15.5)
WBC: 21.1 10*3/uL — ABNORMAL HIGH (ref 4.0–10.5)
nRBC: 0.1 % (ref 0.0–0.2)

## 2022-11-04 LAB — LACTIC ACID, PLASMA
Lactic Acid, Venous: 7.9 mmol/L (ref 0.5–1.9)
Lactic Acid, Venous: 5.8 mmol/L (ref 0.5–1.9)

## 2022-11-04 LAB — LIPASE, BLOOD: Lipase: 44 U/L (ref 11–51)

## 2022-11-04 MED ORDER — ORAL CARE MOUTH RINSE
15.0000 mL | OROMUCOSAL | Status: DC
  Administered 2022-11-05 (×4): 15 mL via OROMUCOSAL

## 2022-11-04 MED ORDER — ORAL CARE MOUTH RINSE: 15.0000 mL | OROMUCOSAL | Status: DC | PRN

## 2022-11-04 MED ORDER — SODIUM CHLORIDE 0.9 % IV SOLN
2.0000 g | Freq: Once | INTRAVENOUS | Status: AC
  Administered 2022-11-04: 2 g via INTRAVENOUS
  Filled 2022-11-04: qty 12.5

## 2022-11-04 MED ORDER — OXYCODONE HCL 5 MG PO TABS: 5.0000 mg | ORAL_TABLET | Freq: Once | ORAL | Status: DC

## 2022-11-04 MED ORDER — VANCOMYCIN HCL IN DEXTROSE 1-5 GM/200ML-% IV SOLN
1000.0000 mg | INTRAVENOUS | Status: AC
  Administered 2022-11-04 (×2): 1000 mg via INTRAVENOUS
  Filled 2022-11-04 (×2): qty 200

## 2022-11-04 MED ORDER — CHLORHEXIDINE GLUCONATE CLOTH 2 % EX PADS
6.0000 | MEDICATED_PAD | Freq: Every day | CUTANEOUS | Status: DC
  Administered 2022-11-05 – 2022-11-11 (×9): 6 via TOPICAL

## 2022-11-04 MED ORDER — SODIUM CHLORIDE 0.9 % IV BOLUS
2000.0000 mL | Freq: Once | INTRAVENOUS | Status: AC
  Administered 2022-11-04: 2000 mL via INTRAVENOUS

## 2022-11-04 NOTE — Sepsis Progress Note (Signed)
Following per sepsis protocol   

## 2022-11-04 NOTE — ED Notes (Signed)
[facility] returned call to confirm accepting EDP. Per Dr. Oswald Hillock, Dr. Armandina Gemma is accepting doctor.

## 2022-11-04 NOTE — ED Provider Notes (Signed)
[facility]ERG[facility] DEPT Provider Note   CSN: [id number] Arrival date & time: 11/04/22  1937     History Chief Complaint  Patient presents with   Weakness    HPI [name] is a 76 y.o. male presenting for AMS. His wife provides most of the history.  She states that he has had altered mental status over the past 24 hours.  He has a history of prostate cancer in remission, hypertension, CAD status post PCI x 2.  She notes a fever of 104 at home today.  He denies any localizing symptoms other than generalized abdominal pain.  He has also had diarrhea. No recent antibiotic exposure.  Denies cough, shortness of breath, urinary discomfort. Throughout the day today, he was intermittently confused not knowing what was happening around him.. Patient's recorded medical, surgical, social, medication list and allergies were reviewed in the Snapshot window as part of the initial history.   Review of Systems   Review of Systems  Constitutional:  Positive for fatigue and fever. Negative for chills.  HENT:  Negative for ear pain and sore throat.   Eyes:  Negative for pain and visual disturbance.  Respiratory:  Negative for cough and shortness of breath.   Cardiovascular:  Negative for chest pain and palpitations.  Gastrointestinal:  Positive for abdominal pain, nausea and vomiting.  Genitourinary:  Negative for dysuria and hematuria.  Musculoskeletal:  Negative for arthralgias and back pain.  Skin:  Negative for color change and rash.  Neurological:  Negative for seizures and syncope.  Psychiatric/Behavioral:  Positive for confusion.   All other systems reviewed and are negative.   Physical Exam Updated Vital Signs BP 97/60   Pulse (!) 111   Temp (!) 97.5 F (36.4 C)   Resp (!) 25   Ht '5\' 11"'$  (1.803 m)   Wt 93 kg   SpO2 95%   BMI 28.59 kg/m  Physical Exam Vitals and nursing note reviewed.  Constitutional:      General: He is not in acute distress.     Appearance: He is well-developed.  HENT:     Head: Normocephalic and atraumatic.  Eyes:     Conjunctiva/sclera: Conjunctivae normal.  Cardiovascular:     Rate and Rhythm: Regular rhythm. Tachycardia present.     Heart sounds: No murmur heard. Pulmonary:     Effort: Pulmonary effort is normal. No respiratory distress.     Breath sounds: Normal breath sounds.  Abdominal:     Palpations: Abdomen is soft.     Tenderness: There is abdominal tenderness. There is right CVA tenderness and left CVA tenderness. There is no guarding.  Musculoskeletal:        General: No swelling.     Cervical back: Neck supple.  Skin:    General: Skin is warm and dry.     Capillary Refill: Capillary refill takes less than 2 seconds.  Neurological:     Mental Status: He is alert.  Psychiatric:        Mood and Affect: Mood normal.      ED Course/ Medical Decision Making/ A&P Clinical Course as of 11/04/22 2255  Mon Nov 04, 2022  2040 Fever/confusion.  Cancer history prostate in remission.  Temp 102 at home Diarrhea  [CC]    Clinical Course User Index [CC] Tretha Sciara, MD    Procedures .Critical Care  Performed by: Tretha Sciara, MD Authorized by: Tretha Sciara, MD   Critical care provider statement:    Critical care  time (minutes):  80   Critical care was necessary to treat or prevent imminent or life-threatening deterioration of the following conditions:  Circulatory failure, sepsis, shock, metabolic crisis and renal failure   Critical care was time spent personally by me on the following activities:  Blood draw for specimens, development of treatment plan with patient or surrogate, discussions with consultants, evaluation of patient's response to treatment, examination of patient, obtaining history from patient or surrogate, review of old charts, re-evaluation of patient's condition, ordering and review of laboratory studies, ordering and review of radiographic studies and ordering  and performing treatments and interventions   Care discussed with: admitting provider      Medications Ordered in ED Medications  vancomycin (VANCOCIN) IVPB 1000 mg/200 mL premix (1,000 mg Intravenous New Bag/Given 11/04/22 2247)  ceFEPIme (MAXIPIME) 2 g in sodium chloride 0.9 % 100 mL IVPB (0 g Intravenous Stopped 11/04/22 2150)  sodium chloride 0.9 % bolus 2,000 mL (2,000 mLs Intravenous New Bag/Given 11/04/22 2107)    Medical Decision Making:    [name] is a 76 y.o. male who presented to the ED today with altered mental status detailed above.     Additional history discussed with patient's family/caregivers.  Patient's presentation is complicated by their history of multiple comorbid medical conditions.  Patient placed on continuous vitals and telemetry monitoring while in ED which was reviewed periodically.   Complete initial physical exam performed, notably the patient  was tachycardic, hypotensive, hypothermic prompting concern for sepsis.  Sepsis protocol activated code sepsis initiated.   Reviewed and confirmed nursing documentation for past medical history, family history, social history.    Initial Assessment:   With the patient's presentation of altered mental status, most likely diagnosis is infection including urinary tract infection, intra-abdominal infection such as diverticulitis, pulmonary infection including pneumonia. Other diagnoses were considered including (but not limited to) appendicitis, cholecystitis, small bowel obstruction, C. difficile infection. These are considered less likely due to history of present illness and physical exam findings.   This is most consistent with an acute life/limb threatening illness complicated by underlying chronic conditions.  Initial Plan:  CT abdomen pelvis with contrast to evaluate for structural intra-abdominal pathology Stool testing including GI path panel and C. difficile testing Screening labs including CBC and  Metabolic panel to evaluate for infectious or metabolic etiology of disease.  Urinalysis with reflex culture ordered to evaluate for UTI or relevant urologic/nephrologic pathology.  CXR to evaluate for structural/infectious intrathoracic pathology.  Objective evaluation as below reviewed with plan for close reassessment  Initial Study Results:   Laboratory  Obvious evidence of sepsis with leukocytosis, lactic acidosis, multiple metabolic derangements including developing multiorgan dysfunction with worsening creatinine  Radiology  All images reviewed independently. Agree with radiology report at this time.   CT ABDOMEN PELVIS WO CONTRAST  Result Date: 11/04/2022 CLINICAL DATA:  Abdominal pain, postop EXAM: CT ABDOMEN AND PELVIS WITHOUT CONTRAST TECHNIQUE: Multidetector CT imaging of the abdomen and pelvis was performed following the standard protocol without IV contrast. RADIATION DOSE REDUCTION: This exam was performed according to the departmental dose-optimization program which includes automated exposure control, adjustment of the mA and/or kV according to patient size and/or use of iterative reconstruction technique. COMPARISON:  05/29/2022 FINDINGS: Lower chest: No acute abnormality Hepatobiliary: Diffuse low-density throughout the liver compatible with fatty infiltration. No focal abnormality. Gallbladder unremarkable. Pancreas: No focal abnormality or ductal dilatation. Spleen: No focal abnormality.  Normal size. Adrenals/Urinary Tract: Adrenal glands normal. Severe left hydronephrosis and  perinephric stranding due to 4 mm left UVJ stone. Multiple nonobstructing stones in the left kidney. No stones or hydronephrosis on the right. Urinary bladder decompressed, grossly unremarkable. Stomach/Bowel: Sigmoid diverticulosis. No active diverticulitis. Stomach and small bowel decompressed, unremarkable. Vascular/Lymphatic: Aortic atherosclerosis. No evidence of aneurysm or adenopathy. Reproductive: No  visible focal abnormality.   Penile implant noted. Other: No free fluid or free air. Musculoskeletal: No acute bony abnormality. IMPRESSION: Severe left hydronephrosis and perinephric stranding due to 4 mm left UVJ stone. Left nephrolithiasis. Hepatic steatosis. Aortic atherosclerosis. Sigmoid diverticulosis. Electronically Signed   By: Rolm Baptise M.D.   On: 11/04/2022 21:57   DG Chest 2 View  Result Date: 11/04/2022 CLINICAL DATA:  Shortness of breath EXAM: CHEST - 2 VIEW COMPARISON:  05/29/2022 FINDINGS: Cardiac shadow is stable. Lungs are well aerated bilaterally. No focal infiltrate or effusion is seen. No bony abnormality is noted. IMPRESSION: No acute abnormality seen. Electronically Signed   By: Inez Catalina M.D.   On: 11/04/2022 21:12     Consults:  Case discussed with Dr. Carlis Abbott of pulmonary critical care, Dr. Jeffie Pollock of urology.  In short, patient is going to require admission to a local ICU for nephrostomy placement.  He is a bad candidate for urology intervention secondary to scarring and chronic cancer.  They requested his admission to [facility] as soon as possible and requested ED to ED transfer for nephrostomy placement. I discussed the case with Dr. Armandina Gemma at [facility] who agreed with ED to ED transfer for definitive care and management.  Plan is to communicate with pulmonary critical care on his arrival for coordination of procedure.   Disposition:   Based on the above findings, I believe this patient is stable for admission.    Patient/family educated about specific findings on our evaluation and explained exact reasons for admission.  Patient/family educated about clinical situation and time was allowed to answer questions.   Admission team communicated with and agreed with need for admission. Patient admitted. Patient  ready to move at this time.     Emergency Department Medication Summary:   Medications  vancomycin (VANCOCIN) IVPB 1000 mg/200 mL premix  (1,000 mg Intravenous New Bag/Given 11/04/22 2247)  ceFEPIme (MAXIPIME) 2 g in sodium chloride 0.9 % 100 mL IVPB (0 g Intravenous Stopped 11/04/22 2150)  sodium chloride 0.9 % bolus 2,000 mL (2,000 mLs Intravenous New Bag/Given 11/04/22 2107)         Clinical Impression:  1. Obstructive pyelonephritis      Admit   Final Clinical Impression(s) / ED Diagnoses Final diagnoses:  Obstructive pyelonephritis    Rx / DC Orders ED Discharge Orders     None         Tretha Sciara, MD 11/04/22 2256

## 2022-11-04 NOTE — ED Notes (Signed)
Report to [place] with [facility]

## 2022-11-04 NOTE — ED Triage Notes (Signed)
Patient sent from urgent care due to tachycardia and hypotension. Patient slept all day today, woke up around 3 or 4 this afternoon. Patient has had vomiting, diarrhea, and chills. Mild positive COVID test at home. Patient had temp of 102 prior to going to urgent care. Patient is pale in appearance and is altered. Wife states this is not typical of him and he is usually alert and oriented.

## 2022-11-04 NOTE — H&P (Signed)
NAME:  [name], MRN:  [id number], DOB:  1946/06/25, LOS: 0 ADMISSION DATE:  11/04/2022, CONSULTATION DATE:  11/04/22 REFERRING MD:  Serita Kyle, CHIEF COMPLAINT:  sepsis, obstructing urinary stone   History of Present Illness:  [name] is a 76 year old gentleman with a history of CKD stage III, coronary artery disease, gout, bladder cancer, prostate adenocarcinoma, nephrolithiasis who presented from urgent care with hypotension and tachycardia.  He has been having pain from kidney stones since 12/10 with associated diarrhea.  He came to the ED and was found to have left hydronephrosis and pyelonephritis with an obstructing UPJ stone.  He was transferred from [place] ED to [facility] for evaluation.  Due to his extensive urologic history urology has recommended nephrostomy tube placement.  He follows with urologist Dr. Standley Dakins at the [facility]. He denies other new symptoms. Per chart review, he admitted to a weakly positive covid test in the ED triage area.  He has had several episodes of diarrhea since presenting to the ED.   Pertinent  Medical History  Prostate adenocarcinoma Bladder cancer CAD Kidney stones CKD 3 Gout Hypertension Hyperlipidemia   Significant Hospital Events: Including procedures, antibiotic start and stop dates in addition to other pertinent events   11/11 presented to the ED, transferred to [facility] overnight  Interim History / Subjective:    Objective   Blood pressure 97/60, pulse (!) 111, temperature (!) 97.5 F (36.4 C), resp. rate (!) 25, height '5\' 11"'$  (1.803 m), weight 93 kg, SpO2 95 %.        Intake/Output Summary (Last 24 hours) at 11/04/2022 2240 Last data filed at 11/04/2022 2159 Gross per 24 hour  Intake 99.54 ml  Output --  Net 99.54 ml   Filed Weights   11/04/22 2022  Weight: 93 kg    Examination: General: Ill-appearing man lying in bed no acute distress HENT: /AT, dry oral mucosa Lungs: Breathing comfortably on room  air, mild tachypnea, CTAB Cardiovascular: S1-S2, tachycardic, regular rhythm Abdomen: Soft, nontender, liquid stool Extremities: No significant edema, no cyanosis Neuro: Slightly confused, oriented to person and place.  Moving all extremities spontaneously. Derm: Jaundiced, no diffuse rashes  BUN 35 Creatinine 3.92 Lactic acid 7.9> 5.8  CT abd pelvis personally reviewed: Left obstructing UVJ stone with severe hydronephrosis and perinephric stranding.    Resolved Hospital Problem list     Assessment & Plan:  Septic shock due to urinary tract infection, left pyelonephritis with obstructing UPJ stone. Severe lactic acidosis - Additional 1 L IV fluid - Maintain adequate perfusion with MAP greater than 65.  Add norepinephrine if blood pressure starts to drop - Continue Vanco and cefepime - Cultures obtained in the ED, continue to monitor - Checking for C. difficile and COVID -IR consult for nephrostomy tube to relieve obstruction for source control.  Discussed with Dr. Anselm Pancoast this evening. -urology aware that he has been transferred to [facility]; can consult formally during the day  Acute kidney injury on CKD3a -Additional IV fluids - Strict I's/O - Renally dose meds and avoid nephrotoxic meds  Hyperglycemia - Sliding scale insulin as needed - Goal blood glucose [phone]  Acute metabolic acidosis due to sepsis - Avoid deliriogenic medications as able - Supportive care  Hyperbilirubinemia, likely due to sepsis.  No evidence of gallbladder pathology on CT scan.  Steatosis on CT. - Trend  Diarrhea - Check for C. difficile - No Lomotil at this point  I discussed his CODE STATUS, where he indicated that he is  DNR.  He reports that his family is aware of this.  Two nurses witnessed this discussion.  Order updated in his chart.  Previous admissions he has been full code.  Best Practice (right click and "Reselect all SmartList Selections" daily)   Diet/type: NPO DVT prophylaxis:  prophylactic heparin  GI prophylaxis: N/A Lines: N/A Foley:  N/A Code Status:  DNR Last date of multidisciplinary goals of care discussion '[ ]'$   Labs   CBC: Recent Labs  Lab 11/04/22 2047  WBC 21.1*  NEUTROABS 19.0*  HGB 14.0  HCT 41.9  MCV 98.6  PLT 129*    Basic Metabolic Panel: Recent Labs  Lab 11/04/22 2047  NA 140  K 3.7  CL 99  CO2 23  GLUCOSE 150*  BUN 35*  CREATININE 3.92*  CALCIUM 9.4   GFR: Estimated Creatinine Clearance: 18.7 mL/min (A) (by C-G formula based on SCr of 3.92 mg/dL (H)). Recent Labs  Lab 11/04/22 2047  WBC 21.1*  LATICACIDVEN 7.9*    Liver Function Tests: Recent Labs  Lab 11/04/22 2047  AST 40  ALT 14  ALKPHOS 49  BILITOT 1.3*  PROT 6.9  ALBUMIN 4.1   Recent Labs  Lab 11/04/22 2047  LIPASE 44   No results for input(s): "AMMONIA" in the last 168 hours.  ABG    Component Value Date/Time   TCO2 25 12/30/2017 0649     Coagulation Profile: No results for input(s): "INR", "PROTIME" in the last 168 hours.  Cardiac Enzymes: No results for input(s): "CKTOTAL", "CKMB", "CKMBINDEX", "TROPONINI" in the last 168 hours.  HbA1C: Hgb A1c MFr Bld  Date/Time Value Ref Range Status  08/09/2011 11:29 AM 5.8 4.6 - 6.5 % Final    Comment:    Glycemic Control Guidelines for People with Diabetes:Non Diabetic:  <6%Goal of Therapy: <7%Additional Action Suggested:  >8%   01/19/2010 10:29 PM  4.6 - 6.1 % Final   5.6 (NOTE) The ADA recommends the following therapeutic goal for glycemic control related to Hgb A1c measurement: Goal of therapy: <6.5 Hgb A1c  Reference: American Diabetes Association: Clinical Practice Recommendations 2010, Diabetes Care, 2010, 33: (Suppl  1).    CBG: No results for input(s): "GLUCAP" in the last 168 hours.  Review of Systems:   Review of Systems  Constitutional:  Positive for chills, fever and malaise/fatigue.  Respiratory:  Negative for sputum production.   Cardiovascular: Negative.    Gastrointestinal:  Positive for abdominal pain and diarrhea.  Genitourinary:  Positive for dysuria.     Past Medical History:  He,  has a past medical history of Atrophy of right kidney, CKD (chronic kidney disease), stage III ([place]), CORONARY ARTERY DISEASE (CARDIOLOGIST--  DR WALL AND DR Angelena Form----   [facility] [place]), Cystitis, radiation, DDD (degenerative disc disease), lumbar, Diverticulosis of colon, GOUT (06/14/2008), H/O agent Orange exposure, History of bladder cancer (2016), History of kidney stones, History of myocardial infarction, History of recurrent UTIs, Hyperlipidemia, Hypertension, Nephrolithiasis, Nocturia, Normal exercise tolerance test results, OA (osteoarthritis), Organic impotence, PONV (postoperative nausea and vomiting), Recurrent prostate adenocarcinoma [facility]) (urologist--  dr Irine Seal---- per pt last PSA 0.12 as of 12-02-2017), Rosacea, S/P coronary artery stent placement, S/P radiation therapy (10/02/11 - 11/25/11), Spinal stenosis, SUI (stress urinary incontinence), male, and Wears glasses.   Surgical History:   Past Surgical History:  Procedure Laterality Date   ANTERIOR CERVICAL DECOMP/DISCECTOMY FUSION  2001   w/ removal bone spur--  1 level   BONE CYST EXCISION  2012   LUMBAR   CARDIOVASCULAR STRESS TEST  04-23-2011  dr Angelena Form   Low risk nuclear study/ fixed mixed basal inferior and inferolateral perfusion defect with wall motion abnormality that suggest prior MI, no significant ishemia/  ef 54%   COLONOSCOPY  03-04-2007   CORONARY ANGIOPLASTY  01-22-2010  dr Darnell Level brodie   successfully cutting angioplasty to in-stent restenosis  mCFX   CORONARY ANGIOPLASTY WITH STENT PLACEMENT  04-29-2003  dr Lyndel Safe   BMS x1 and DES x1 to mid left CFX for total occlusion 99%/  nonobstructive dLAD 60% and pLAD30%,  pRCA 30%,  lateral and mid inferior akinesis,  moderate irregularities LM,  D2 and D3 40%,  ef 50%   CYSTO W/ PLACEMENT RIGHT  URETERAL STENT  07-19-2004   CYSTOSCOPY N/A 01/03/2015   Procedure: CYSTOSCOPY WITH FULGURATION OF BLEEDERS;  Surgeon: Malka So, MD;  [facility];  Service: Urology;  Laterality: N/A;   CYSTOSCOPY W/ URETERAL STENT PLACEMENT Left 01/05/2018   Procedure: CYSTOSCOPY WITH LEFT RETROGRADE PYELOGRAM/ LEFT URETEROSCOPY/STONE BASKET REMOVAL/ LEFT URETERAL STENT PLACEMENT;  Surgeon: Ceasar Mons, MD;  Location: [facility];  Service: Urology;  Laterality: Left;   CYSTOSCOPY WITH BIOPSY N/A 12/01/2014   Procedure: CYSTOSCOPY WITH BIOPSY/FULGURATION;  Surgeon: Malka So, MD;  Location: [facility];  Service: Urology;  Laterality: N/A;   CYSTOSCOPY WITH RETROGRADE PYELOGRAM, URETEROSCOPY AND STENT PLACEMENT Right 05/16/2016   Procedure: CYSTOSCOPY WITH RIGHT RETROGRADE PYELOGRAM, URETEROSCOPY WITH LASER AND STENT PLACEMENT;  Surgeon: Irine Seal, MD;  Location[facility];  Service: Urology;  Laterality: Right;   CYSTOSCOPY WITH STENT PLACEMENT Left 12/04/2017   Procedure: CYSTOSCOPY WITH STENT PLACEMENT;  Surgeon: Irine Seal, MD;  Location: [facility];  Service: Urology;  Laterality: Left;   CYSTOSCOPY/URETEROSCOPY/HOLMIUM LASER/STENT PLACEMENT Left 12/30/2017   Procedure: CYSTOSCOPY LEFT URETEROSCOPY WITH HOLMIUM LASER AND  LEFT STENT PLACEMENT;  Surgeon: Irine Seal, MD;  Location: [facility];  Service: Urology;  Laterality: Left;   EXTRACORPOREAL SHOCK WAVE LITHOTRIPSY Left 09-16-2013   EXTRACORPOREAL SHOCK WAVE LITHOTRIPSY Left 12/08/2017   Procedure: LEFT EXTRACORPOREAL SHOCK WAVE LITHOTRIPSY (ESWL);  Surgeon: Franchot Gallo, MD;  Location: [facility];  Service: Urology;  Laterality: Left;   HOLMIUM LASER APPLICATION Right 01/06/2481   Procedure: HOLMIUM LASER APPLICATION;  Surgeon: Irine Seal, MD;  Location: [facility];  Service: Urology;  Laterality: Right;   INGUINAL HERNIA REPAIR Left  11-03-2003   LUMBAR [place]   LUMBAR LAMINECTOMY  04-25-2011   LUMBAR LAMINECTOMY/DECOMPRESSION MICRODISCECTOMY Right 11/29/2015   Procedure:  DECOMPRESSION LUMBAR LAMINECTOMY L4-L5 MICRODISCECTOMY L4-L5 LEFT    EXCISION SENOVAL CYST LUMBAR 4-5 RIGHT  PARICAL FACETECTOMY LUMBAR 4-5 RIGHT ;  Surgeon: Latanya Maudlin, MD;  Location: [facility];  Service: Orthopedics;  Laterality: Right;   PENILE PROSTHESIS IMPLANT N/A 02/16/2013   Procedure: REPLACEMENT OF AMS PENILE PROTHESIS INFLATABLE;  Surgeon: Malka So, MD;  Location: [facility];  Service: Urology;  Laterality: N/A;   PENILE PROSTHESIS PLACEMENT  2003   RADICAL PROSTATECTOMY W/ BILATERAL PELVIC LYMPHADENECTOMY  08-07-2000   RIGHT URETEROSCOPIC LASER LITHOTRIPSY STONE EXTRACTION AND STENT  07-26-2004   TRANSTHORACIC ECHOCARDIOGRAM  01-21-2010   mild LVH/  ef 60-65%/  trivial TR     Social History:   reports that he quit smoking about 19 years ago. His smoking use included cigarettes. He has a 60.00 pack-year smoking history. He has never used smokeless tobacco.  He reports current alcohol use of about 21.0 standard drinks of alcohol per week. He reports that he does not use drugs.   Family History:  His family history includes Heart attack in his father; Hypertension in an other family member.   Allergies No Known Allergies   Home Medications  Prior to Admission medications   Medication Sig Start Date End Date Taking? Authorizing Provider  abiraterone acetate (ZYTIGA) 250 MG tablet Take 1,000 mg by mouth daily. Take on an empty stomach    [provider]  acetaminophen (TYLENOL) 500 MG tablet Take 1,000 mg by mouth daily as needed for headache.    [provider]  allopurinol (ZYLOPRIM) 300 MG tablet Take 300 mg by mouth daily.    [provider]  aspirin EC 81 MG tablet Take 81 mg by mouth daily.    [provider]  atorvastatin (LIPITOR) 80 MG tablet Take 40 mg by mouth daily.    [provider]  Cholecalciferol (VITAMIN D3) 25 MCG (1000 UT) CAPS Take 1,000 Units by mouth daily.    [provider]  COVID-19 mRNA bivalent vaccine, Pfizer, (PFIZER COVID-19 VAC BIVALENT) injection Inject into the muscle. 09/12/21   Carlyle Basques, MD  gabapentin (NEURONTIN) 300 MG capsule Take 600 mg by mouth daily.    [provider]  metoprolol tartrate (LOPRESSOR) 25 MG tablet Take 12.5 mg by mouth every evening.  Patient not taking: Reported on 05/29/2022    [provider]  Multiple Vitamin (MULTIVITAMIN PO) Take 1 tablet by mouth daily.    [provider]  nitroGLYCERIN (NITROSTAT) 0.4 MG SL tablet Place 1 tablet (0.4 mg total) under the tongue every 5 (five) minutes as needed for chest pain. 10/06/13   Plotnikov, Evie Lacks, MD  Omega-3 Fatty Acids (FISH OIL) 1200 MG CAPS Take 1,200 mg by mouth daily.    [provider]  ondansetron (ZOFRAN-ODT) 4 MG disintegrating tablet Take 1 tablet (4 mg total) by mouth every 8 (eight) hours as needed for nausea or vomiting. 05/29/22   Davonna Belling, MD  OVER THE COUNTER MEDICATION Take 750 mg by mouth daily. Osteo Bi Flex Glucosamin    [provider]  OVER THE COUNTER MEDICATION Take 1 tablet by mouth daily. Calcium 1200 mg + D3 25 mcg (2000 units)    [provider]  predniSONE (DELTASONE) 5 MG tablet Take 5 mg by mouth daily.    [provider]  triamcinolone cream (KENALOG) 0.1 % Apply 1 Application topically daily.    [provider]  vitamin C (ASCORBIC ACID) 500 MG tablet Take 500 mg by mouth daily.    [provider]  Zinc 50 MG TABS Take 50 mg by mouth daily in the afternoon.    [provider]     Critical care time: 50 min.    Julian Hy, DO 11/05/22 12:58 AM [facility]

## 2022-11-04 NOTE — ED Notes (Signed)
Call placed to [facility] for tranport to [facility] ED to ED

## 2022-11-05 ENCOUNTER — Inpatient Hospital Stay (HOSPITAL_COMMUNITY): Payer: Medicare PPO

## 2022-11-05 ENCOUNTER — Other Ambulatory Visit: Payer: Self-pay | Admitting: Urology

## 2022-11-05 DIAGNOSIS — E440 Moderate protein-calorie malnutrition: Secondary | ICD-10-CM | POA: Diagnosis present

## 2022-11-05 DIAGNOSIS — Z79899 Other long term (current) drug therapy: Secondary | ICD-10-CM | POA: Diagnosis not present

## 2022-11-05 DIAGNOSIS — N179 Acute kidney failure, unspecified: Secondary | ICD-10-CM | POA: Diagnosis present

## 2022-11-05 DIAGNOSIS — M109 Gout, unspecified: Secondary | ICD-10-CM | POA: Diagnosis present

## 2022-11-05 DIAGNOSIS — A419 Sepsis, unspecified organism: Secondary | ICD-10-CM | POA: Diagnosis present

## 2022-11-05 DIAGNOSIS — A4153 Sepsis due to Serratia: Secondary | ICD-10-CM | POA: Diagnosis present

## 2022-11-05 DIAGNOSIS — G9341 Metabolic encephalopathy: Secondary | ICD-10-CM | POA: Diagnosis present

## 2022-11-05 DIAGNOSIS — N136 Pyonephrosis: Secondary | ICD-10-CM | POA: Diagnosis present

## 2022-11-05 DIAGNOSIS — R579 Shock, unspecified: Secondary | ICD-10-CM

## 2022-11-05 DIAGNOSIS — Z87891 Personal history of nicotine dependence: Secondary | ICD-10-CM | POA: Diagnosis not present

## 2022-11-05 DIAGNOSIS — N3040 Irradiation cystitis without hematuria: Secondary | ICD-10-CM | POA: Diagnosis present

## 2022-11-05 DIAGNOSIS — E8721 Acute metabolic acidosis: Secondary | ICD-10-CM | POA: Diagnosis present

## 2022-11-05 DIAGNOSIS — I129 Hypertensive chronic kidney disease with stage 1 through stage 4 chronic kidney disease, or unspecified chronic kidney disease: Secondary | ICD-10-CM | POA: Diagnosis present

## 2022-11-05 DIAGNOSIS — Y842 Radiological procedure and radiotherapy as the cause of abnormal reaction of the patient, or of later complication, without mention of misadventure at the time of the procedure: Secondary | ICD-10-CM | POA: Diagnosis present

## 2022-11-05 DIAGNOSIS — C61 Malignant neoplasm of prostate: Secondary | ICD-10-CM | POA: Diagnosis present

## 2022-11-05 DIAGNOSIS — K760 Fatty (change of) liver, not elsewhere classified: Secondary | ICD-10-CM | POA: Diagnosis present

## 2022-11-05 DIAGNOSIS — Z6830 Body mass index (BMI) 30.0-30.9, adult: Secondary | ICD-10-CM | POA: Diagnosis not present

## 2022-11-05 DIAGNOSIS — I700 Atherosclerosis of aorta: Secondary | ICD-10-CM | POA: Diagnosis present

## 2022-11-05 DIAGNOSIS — I2510 Atherosclerotic heart disease of native coronary artery without angina pectoris: Secondary | ICD-10-CM | POA: Diagnosis present

## 2022-11-05 DIAGNOSIS — N111 Chronic obstructive pyelonephritis: Secondary | ICD-10-CM | POA: Diagnosis present

## 2022-11-05 DIAGNOSIS — Z66 Do not resuscitate: Secondary | ICD-10-CM | POA: Diagnosis present

## 2022-11-05 DIAGNOSIS — C775 Secondary and unspecified malignant neoplasm of intrapelvic lymph nodes: Secondary | ICD-10-CM | POA: Diagnosis present

## 2022-11-05 DIAGNOSIS — N1831 Chronic kidney disease, stage 3a: Secondary | ICD-10-CM | POA: Diagnosis present

## 2022-11-05 DIAGNOSIS — Z923 Personal history of irradiation: Secondary | ICD-10-CM | POA: Diagnosis not present

## 2022-11-05 DIAGNOSIS — Z1152 Encounter for screening for COVID-19: Secondary | ICD-10-CM | POA: Diagnosis not present

## 2022-11-05 DIAGNOSIS — E785 Hyperlipidemia, unspecified: Secondary | ICD-10-CM | POA: Diagnosis present

## 2022-11-05 DIAGNOSIS — A498 Other bacterial infections of unspecified site: Secondary | ICD-10-CM | POA: Diagnosis not present

## 2022-11-05 DIAGNOSIS — R6521 Severe sepsis with septic shock: Secondary | ICD-10-CM | POA: Diagnosis not present

## 2022-11-05 HISTORY — PX: IR NEPHROSTOMY PLACEMENT LEFT: IMG6063

## 2022-11-05 LAB — GASTROINTESTINAL PANEL BY PCR, STOOL (REPLACES STOOL CULTURE)

## 2022-11-05 LAB — HEPATIC FUNCTION PANEL
ALT: 16 U/L (ref 0–44)
AST: 48 U/L — ABNORMAL HIGH (ref 15–41)
Albumin: 2.7 g/dL — ABNORMAL LOW (ref 3.5–5.0)
Alkaline Phosphatase: 37 U/L — ABNORMAL LOW (ref 38–126)
Bilirubin, Direct: 0.2 mg/dL (ref 0.0–0.2)
Indirect Bilirubin: 0.8 mg/dL (ref 0.3–0.9)
Total Bilirubin: 1 mg/dL (ref 0.3–1.2)
Total Protein: 5.5 g/dL — ABNORMAL LOW (ref 6.5–8.1)

## 2022-11-05 LAB — CBC
HCT: 34 % — ABNORMAL LOW (ref 39.0–52.0)
Hemoglobin: 11.3 g/dL — ABNORMAL LOW (ref 13.0–17.0)
MCH: 32.9 pg (ref 26.0–34.0)
MCHC: 33.2 g/dL (ref 30.0–36.0)
MCV: 99.1 fL (ref 80.0–100.0)
Platelets: 96 10*3/uL — ABNORMAL LOW (ref 150–400)
RBC: 3.43 MIL/uL — ABNORMAL LOW (ref 4.22–5.81)
RDW: 13.4 % (ref 11.5–15.5)
WBC: 11.8 10*3/uL — ABNORMAL HIGH (ref 4.0–10.5)
nRBC: 0 % (ref 0.0–0.2)

## 2022-11-05 LAB — BASIC METABOLIC PANEL
Anion gap: 10 (ref 5–15)
BUN: 38 mg/dL — ABNORMAL HIGH (ref 8–23)
CO2: 22 mmol/L (ref 22–32)
Calcium: 8.1 mg/dL — ABNORMAL LOW (ref 8.9–10.3)
Chloride: 107 mmol/L (ref 98–111)
Creatinine, Ser: 4.17 mg/dL — ABNORMAL HIGH (ref 0.61–1.24)
GFR, Estimated: 14 mL/min — ABNORMAL LOW (ref >60)
Glucose, Bld: 114 mg/dL — ABNORMAL HIGH (ref 70–99)
Potassium: 4.1 mmol/L (ref 3.5–5.1)
Sodium: 139 mmol/L (ref 135–145)

## 2022-11-05 LAB — BLOOD CULTURE ID PANEL (REFLEXED) - BCID2

## 2022-11-05 LAB — SARS CORONAVIRUS 2 BY RT PCR: SARS Coronavirus 2 by RT PCR: NEGATIVE

## 2022-11-05 LAB — PHOSPHORUS: Phosphorus: 4.2 mg/dL (ref 2.5–4.6)

## 2022-11-05 LAB — GLUCOSE, CAPILLARY
Glucose-Capillary: 127 mg/dL — ABNORMAL HIGH (ref 70–99)
Glucose-Capillary: 104 mg/dL — ABNORMAL HIGH (ref 70–99)

## 2022-11-05 LAB — C DIFFICILE QUICK SCREEN W PCR REFLEX
C Diff antigen: NEGATIVE
C Diff interpretation: NOT DETECTED
C Diff toxin: NEGATIVE

## 2022-11-05 LAB — MRSA NEXT GEN BY PCR, NASAL: MRSA by PCR Next Gen: NOT DETECTED

## 2022-11-05 LAB — MAGNESIUM: Magnesium: 1.3 mg/dL — ABNORMAL LOW (ref 1.7–2.4)

## 2022-11-05 LAB — LACTIC ACID, PLASMA: Lactic Acid, Venous: 2.7 mmol/L (ref 0.5–1.9)

## 2022-11-05 MED ORDER — SODIUM CHLORIDE 0.9 % IV BOLUS
1000.0000 mL | Freq: Once | INTRAVENOUS | Status: AC
  Administered 2022-11-05: 1000 mL via INTRAVENOUS

## 2022-11-05 MED ORDER — ONDANSETRON HCL 4 MG/2ML IJ SOLN
4.0000 mg | Freq: Four times a day (QID) | INTRAMUSCULAR | Status: DC | PRN
  Administered 2022-11-05 – 2022-11-06 (×2): 4 mg via INTRAVENOUS
  Filled 2022-11-05 (×2): qty 2

## 2022-11-05 MED ORDER — VANCOMYCIN HCL 1250 MG/250ML IV SOLN: 1250.0000 mg | INTRAVENOUS | Status: DC

## 2022-11-05 MED ORDER — POLYETHYLENE GLYCOL 3350 17 G PO PACK: 17.0000 g | PACK | Freq: Every day | ORAL | Status: DC | PRN

## 2022-11-05 MED ORDER — PREDNISONE 5 MG PO TABS
5.0000 mg | ORAL_TABLET | Freq: Every day | ORAL | Status: DC
  Administered 2022-11-06 – 2022-11-11 (×6): 5 mg via ORAL
  Filled 2022-11-05 (×7): qty 1

## 2022-11-05 MED ORDER — ORAL CARE MOUTH RINSE: 15.0000 mL | OROMUCOSAL | Status: DC | PRN

## 2022-11-05 MED ORDER — HEPARIN SODIUM (PORCINE) 5000 UNIT/ML IJ SOLN
5000.0000 [IU] | Freq: Three times a day (TID) | INTRAMUSCULAR | Status: DC
  Administered 2022-11-05 – 2022-11-06 (×4): 5000 [IU] via SUBCUTANEOUS
  Filled 2022-11-05 (×4): qty 1

## 2022-11-05 MED ORDER — MIDAZOLAM HCL 2 MG/2ML IJ SOLN
INTRAMUSCULAR | Status: AC | PRN
  Administered 2022-11-05: 1 mg via INTRAVENOUS

## 2022-11-05 MED ORDER — IOHEXOL 300 MG/ML  SOLN: 50.0000 mL | Freq: Once | INTRAMUSCULAR | Status: DC | PRN

## 2022-11-05 MED ORDER — FENTANYL CITRATE (PF) 100 MCG/2ML IJ SOLN
INTRAMUSCULAR | Status: AC
  Filled 2022-11-05: qty 2

## 2022-11-05 MED ORDER — LIDOCAINE HCL 1 % IJ SOLN
INTRAMUSCULAR | Status: AC
  Filled 2022-11-05: qty 20

## 2022-11-05 MED ORDER — MIDAZOLAM HCL 2 MG/2ML IJ SOLN
INTRAMUSCULAR | Status: AC
  Filled 2022-11-05: qty 2

## 2022-11-05 MED ORDER — SODIUM CHLORIDE 0.9% FLUSH
5.0000 mL | Freq: Three times a day (TID) | INTRAVENOUS | Status: DC
  Administered 2022-11-05 – 2022-11-11 (×18): 5 mL

## 2022-11-05 MED ORDER — FENTANYL CITRATE PF 50 MCG/ML IJ SOSY
25.0000 ug | PREFILLED_SYRINGE | INTRAMUSCULAR | Status: AC | PRN
  Administered 2022-11-05 (×2): 50 ug via INTRAVENOUS
  Filled 2022-11-05 (×2): qty 1

## 2022-11-05 MED ORDER — LACTATED RINGERS IV SOLN: INTRAVENOUS | Status: AC

## 2022-11-05 MED ORDER — NOREPINEPHRINE 4 MG/250ML-% IV SOLN
2.0000 ug/min | INTRAVENOUS | Status: DC
  Administered 2022-11-05: 2 ug/min via INTRAVENOUS
  Filled 2022-11-05: qty 250

## 2022-11-05 MED ORDER — DOCUSATE SODIUM 100 MG PO CAPS: 100.0000 mg | ORAL_CAPSULE | Freq: Two times a day (BID) | ORAL | Status: DC | PRN

## 2022-11-05 MED ORDER — ABIRATERONE ACETATE 250 MG PO TABS
1000.0000 mg | ORAL_TABLET | Freq: Every day | ORAL | Status: DC
  Administered 2022-11-06 – 2022-11-11 (×6): 1000 mg via ORAL

## 2022-11-05 MED ORDER — LACTATED RINGERS IV BOLUS
1000.0000 mL | Freq: Once | INTRAVENOUS | Status: AC
  Administered 2022-11-05: 1000 mL via INTRAVENOUS

## 2022-11-05 MED ORDER — LACTATED RINGERS IV SOLN: INTRAVENOUS | Status: DC

## 2022-11-05 MED ORDER — FENTANYL CITRATE (PF) 100 MCG/2ML IJ SOLN
INTRAMUSCULAR | Status: AC | PRN
  Administered 2022-11-05: 25 ug via INTRAVENOUS

## 2022-11-05 MED ORDER — SODIUM CHLORIDE 0.9 % IV SOLN
250.0000 mL | INTRAVENOUS | Status: DC
  Administered 2022-11-05: 250 mL via INTRAVENOUS

## 2022-11-05 MED ORDER — SODIUM CHLORIDE 0.9 % IV SOLN
2.0000 g | INTRAVENOUS | Status: DC
  Administered 2022-11-05 – 2022-11-10 (×6): 2 g via INTRAVENOUS
  Filled 2022-11-05 (×6): qty 12.5

## 2022-11-05 NOTE — ED Notes (Signed)
SBAR report given to Salomon Fick, RN in ICU at this time.

## 2022-11-05 NOTE — Progress Notes (Signed)
[place] Progress Note Patient Name: [name] DOB: January 27, 1946 MRN: [id number]   Date of Service  11/05/2022  HPI/Events of Note  Multiple issues: 1. Pain d/t headache and nephrostomy site. 2. Low diastolic BP = 288/33. 3. COVID test negative.   eICU Interventions  Plan: Fentanyl 25-50 mcg IV Q 2 hours PRN X 2.  D/C Airborne precautions and continue enteric precautions.      Intervention Category Major Interventions: Other:;Hypotension - evaluation and management  Reign Bartnick Cornelia Copa 11/05/2022, 3:32 AM

## 2022-11-05 NOTE — Progress Notes (Signed)
Patient's wife informed this nurse that she tested positive for COVID at home. Patient's wife was educated on wearing a mask at all times when in the hospital/in the patient's room and thoroughly washing hands with soap/water before/after entering the patient's room. Patient's wife gave her verbal understanding of this information.

## 2022-11-05 NOTE — Progress Notes (Signed)
Pharmacy Antibiotic Note  [name] is a 76 y.o. male admitted on 11/04/2022 with sepsis/UTI.  PMH significant for CKD stage III, CAD, bladder cancer, prostate adenocarcinoma, kidney stones. Pharmacy has been consulted for Vancomycin and Cefepime dosing.  At [facility] ED, patient received Vancomycin 1gm IV and Cefepime 2gm IV x 1 dose each.  Plan: Cefepime 2gm IV q24h Vancomycin 1250 mg IV Q 48 hrs. Goal AUC 400-550.  Expected AUC: 502.6  SCr used: 3.92 Follow renal function F/u culture results and sensitivities  Height: '5\' 11"'$  (180.3 cm) Weight: 93 kg (205 lb) IBW/kg (Calculated) : 75.3  Temp (24hrs), Avg:98.6 F (37 C), Min:97.5 F (36.4 C), Max:99.6 F (37.6 C)  Recent Labs  Lab 11/04/22 2047 11/04/22 2250  WBC 21.1*  --   CREATININE 3.92*  --   LATICACIDVEN 7.9* 5.8*    Estimated Creatinine Clearance: 18.7 mL/min (A) (by C-G formula based on SCr of 3.92 mg/dL (H)).    No Known Allergies  Antimicrobials this admission: 12/11 Vancomycin >>   12/11 Cefepime >>    Dose adjustments this admission:    Microbiology results: 12/11 BCx:      Thank you for allowing pharmacy to be a part of this patient's care.  Everette Rank, PharmD 11/05/2022 12:49 AM

## 2022-11-05 NOTE — Progress Notes (Signed)
[place] Progress Note Patient Name: [name] DOB: 1946-02-26 MRN: [id number]   Date of Service  11/05/2022  HPI/Events of Note  Patient needs to be on enteric and airborne precautions. However, patient is only on airborne precautions.   eICU Interventions  Will change to airborne and enteric precautions.      Intervention Category Major Interventions: Infection - evaluation and management  Maekayla Giorgio Eugene 11/05/2022, 1:44 AM

## 2022-11-05 NOTE — Progress Notes (Signed)
An USGPIV (ultrasound guided PIV) has been placed for short-term vasopressor infusion. A correctly placed ivWatch must be used when administering Vasopressors. Should this treatment be needed beyond 72 hours, central line access should be obtained.  It will be the responsibility of the bedside nurse to follow best practice to prevent extravasations.   ?

## 2022-11-05 NOTE — H&P (View-Only) (Signed)
Subjective: 1. Obstructive pyelonephritis      Consult requested by Dr. Noemi Chapel  [name] is a 76 yo male who is well known to me with a history of prostate cancer treated with RP in 2006 and then with subsequent salvage radiation therapy.  He has been on ADT with Eligard and Abiaterone for pelvic nodal mets for over a year.  He has a history of stones with multiple prior procedures and has atrophy of the right kidney.   He presented yesterday with the onset of left flank pain on 12/10 which was followed by the onset of diarrhea, fever and chills.  He went to the urgent care and then to [facility] ER where he was found to be severely septic with a 60m obstructing stone at the left UVJ.   After reviewing the films and his history I recommended urgent nephrostomy tube placement because of concerns about the impact of his prior prostate cancer treatment on cystoscopy and stent insertion success probabilities.   The NT was successfully placed last night. ROS:  Review of Systems  Constitutional:  Positive for chills and fever.  Gastrointestinal:  Positive for diarrhea.  Genitourinary:  Positive for flank pain.    No Known Allergies  Past Medical History:  Diagnosis Date   Atrophy of right kidney    CKD (chronic kidney disease), stage III ([place]    CORONARY ARTERY DISEASE CARDIOLOGIST--  DR WALL AND DR MAngelena Form---   [facility] [place]  ETT  05-06-2014,  no ischemia or st changes;  low risk nuclear study 04-23-2011--- a. s/p post wall MI  2004 => overlapping stents in the CFX (one Zeta stent and one Taxus DES);  b. [place] LHC 2/11: Normal LM, normal LAD, normal Dx, CFX 80% ISR, PDA 30-40%, EF 55%. PCI: Cutting balloon angioplasty to the CFX ISR;  c.  Lex MV 5/12: inf and IL defect c/w prior MI, no ischemia, EF 54%   Cystitis, radiation    2013 following salvage therapy for recurrent prostate cancer post prostatectomy    DDD (degenerative disc disease), lumbar     Diverticulosis of colon    GOUT 06/14/2008   H/O agent Orange exposure    History of bladder cancer 2016   urologsit-  dr wJeffie Pollock  History of kidney stones    History of myocardial infarction    04-29-2003  -- posterior and lateral wall MI  s/p  stent x2 to  left mid CFX   History of recurrent UTIs    chronic   Hyperlipidemia    Hypertension    Nephrolithiasis    bilateral   Nocturia    Normal exercise tolerance test results    05-06-2014  Negative for ischemia GXT with the pt exercising to a 10 met work load and 89% APMHR without new diagnotic changes of ischemia   OA (osteoarthritis)    neck and shoulders   Organic impotence    arterial insuff.   PONV (postoperative nausea and vomiting)    Recurrent prostate adenocarcinoma ([facility] urologist--  dr jIrine Seal--- per pt last PSA 0.12 as of 12-02-2017   first dx  2001,  T2b  N0  M0,  Gleason 6--  s/p  radical prostatectomy/   recurrent 11/ 2012  s/p salvage radiation    Rosacea    S/P coronary artery stent placement    BMS x1  and DES x1  to mid left CFX  04-29-2003   S/P radiation therapy  10/02/11 - 11/25/11   Central Lower Pelvis: 8413 cGy/38 Fractions--  for recurrent prostate cancer   Spinal stenosis    SUI (stress urinary incontinence), male    Wears glasses     Past Surgical History:  Procedure Laterality Date   ANTERIOR CERVICAL DECOMP/DISCECTOMY FUSION  2001   w/ removal bone spur--  1 level   BONE CYST EXCISION  2012   LUMBAR   CARDIOVASCULAR STRESS TEST  04-23-2011  dr Angelena Form   Low risk nuclear study/ fixed mixed basal inferior and inferolateral perfusion defect with wall motion abnormality that suggest prior MI, no significant ishemia/  ef 54%   COLONOSCOPY  03-04-2007   CORONARY ANGIOPLASTY  01-22-2010  dr Darnell Level brodie   successfully cutting angioplasty to in-stent restenosis  mCFX   CORONARY ANGIOPLASTY WITH STENT PLACEMENT  04-29-2003  dr Lyndel Safe   BMS x1 and DES x1 to mid left CFX for total occlusion 99%/   nonobstructive dLAD 60% and pLAD30%,  pRCA 30%,  lateral and mid inferior akinesis,  moderate irregularities LM,  D2 and D3 40%,  ef 50%   CYSTO W/ PLACEMENT RIGHT URETERAL STENT  07-19-2004   CYSTOSCOPY N/A 01/03/2015   Procedure: CYSTOSCOPY WITH FULGURATION OF BLEEDERS;  Surgeon: Malka So, MD;  Location: [facility];  Service: Urology;  Laterality: N/A;   CYSTOSCOPY W/ URETERAL STENT PLACEMENT Left 01/05/2018   Procedure: CYSTOSCOPY WITH LEFT RETROGRADE PYELOGRAM/ LEFT URETEROSCOPY/STONE BASKET REMOVAL/ LEFT URETERAL STENT PLACEMENT;  Surgeon: Ceasar Mons, MD;  Location: [facility];  Service: Urology;  Laterality: Left;   CYSTOSCOPY WITH BIOPSY N/A 12/01/2014   Procedure: CYSTOSCOPY WITH BIOPSY/FULGURATION;  Surgeon: Malka So, MD;  Location: [facility];  Service: Urology;  Laterality: N/A;   CYSTOSCOPY WITH RETROGRADE PYELOGRAM, URETEROSCOPY AND STENT PLACEMENT Right 05/16/2016   Procedure: CYSTOSCOPY WITH RIGHT RETROGRADE PYELOGRAM, URETEROSCOPY WITH LASER AND STENT PLACEMENT;  Surgeon: Irine Seal, MD;  Location[facility];  Service: Urology;  Laterality: Right;   CYSTOSCOPY WITH STENT PLACEMENT Left 12/04/2017   Procedure: CYSTOSCOPY WITH STENT PLACEMENT;  Surgeon: Irine Seal, MD;  Location: [facility];  Service: Urology;  Laterality: Left;   CYSTOSCOPY/URETEROSCOPY/HOLMIUM LASER/STENT PLACEMENT Left 12/30/2017   Procedure: CYSTOSCOPY LEFT URETEROSCOPY WITH HOLMIUM LASER AND  LEFT STENT PLACEMENT;  Surgeon: Irine Seal, MD;  Location: [facility];  Service: Urology;  Laterality: Left;   EXTRACORPOREAL SHOCK WAVE LITHOTRIPSY Left 09-16-2013   EXTRACORPOREAL SHOCK WAVE LITHOTRIPSY Left 12/08/2017   Procedure: LEFT EXTRACORPOREAL SHOCK WAVE LITHOTRIPSY (ESWL);  Surgeon: Franchot Gallo, MD;  Location: [facility];  Service: Urology;  Laterality: Left;   HOLMIUM LASER APPLICATION Right 2/44/0102   Procedure:  HOLMIUM LASER APPLICATION;  Surgeon: Irine Seal, MD;  Location: [facility];  Service: Urology;  Laterality: Right;   INGUINAL HERNIA REPAIR Left 11-03-2003   LUMBAR [place]   LUMBAR LAMINECTOMY  04-25-2011   LUMBAR LAMINECTOMY/DECOMPRESSION MICRODISCECTOMY Right 11/29/2015   Procedure:  DECOMPRESSION LUMBAR LAMINECTOMY L4-L5 MICRODISCECTOMY L4-L5 LEFT    EXCISION SENOVAL CYST LUMBAR 4-5 RIGHT  PARICAL FACETECTOMY LUMBAR 4-5 RIGHT ;  Surgeon: Latanya Maudlin, MD;  Location: [facility];  Service: Orthopedics;  Laterality: Right;   PENILE PROSTHESIS IMPLANT N/A 02/16/2013   Procedure: REPLACEMENT OF AMS PENILE PROTHESIS INFLATABLE;  Surgeon: Malka So, MD;  Location: [facility];  Service: Urology;  Laterality: N/A;   PENILE PROSTHESIS PLACEMENT  2003   RADICAL PROSTATECTOMY W/ BILATERAL  PELVIC LYMPHADENECTOMY  08-07-2000   RIGHT URETEROSCOPIC LASER LITHOTRIPSY STONE EXTRACTION AND STENT  07-26-2004   TRANSTHORACIC ECHOCARDIOGRAM  01-21-2010   mild LVH/  ef 60-65%/  trivial TR    Social History   Socioeconomic History   Marital status: Married    Spouse name: Not on file   Number of children: 5   Years of education: Not on file   Highest education level: Not on file  Occupational History   Occupation: Merchandiser, retail    Employer: [employer]  Tobacco Use   Smoking status: Former    Packs/day: 1.50    Years: 40.00    Total pack years: 60.00    Types: Cigarettes    Quit date: 11/28/2002    Years since quitting: 19.9   Smokeless tobacco: Never  Vaping Use   Vaping Use: Never used  Substance and Sexual Activity   Alcohol use: Yes    Alcohol/week: 21.0 standard drinks of alcohol    Types: 21 Shots of liquor per week    Comment: 3 oz  per daily   Drug use: No   Sexual activity: Yes  Other Topics Concern   Not on file  Social History Narrative   Not on file   Social Determinants of Health   Financial Resource Strain: Not on file  Food  Insecurity: Not on file  Transportation Needs: Not on file  Physical Activity: Not on file  Stress: Not on file  Social Connections: Not on file  Intimate Partner Violence: Not on file    Family History  Problem Relation Age of Onset   Hypertension Other    Heart attack Father     Anti-infectives: Anti-infectives (From admission, onward)    Start     Dose/Rate Route Frequency Ordered Stop   11/06/22 1100  vancomycin (VANCOREADY) IVPB 1250 mg/250 mL        1,250 mg 166.7 mL/hr over 90 Minutes Intravenous Every 48 hours 11/05/22 0054     11/05/22 2200  ceFEPIme (MAXIPIME) 2 g in sodium chloride 0.9 % 100 mL IVPB        2 g 200 mL/hr over 30 Minutes Intravenous Every 24 hours 11/05/22 0054     11/04/22 2130  vancomycin (VANCOCIN) IVPB 1000 mg/200 mL premix        1,000 mg 200 mL/hr over 60 Minutes Intravenous Every hour 11/04/22 2046 11/04/22 2347   11/04/22 2100  ceFEPIme (MAXIPIME) 2 g in sodium chloride 0.9 % 100 mL IVPB        2 g 200 mL/hr over 30 Minutes Intravenous  Once 11/04/22 2046 11/04/22 2150       Current Facility-Administered Medications  Medication Dose Route Frequency Provider Last Rate Last Admin   0.9 %  sodium chloride infusion  250 mL Intravenous Continuous Noemi Chapel P, DO       ceFEPIme (MAXIPIME) 2 g in sodium chloride 0.9 % 100 mL IVPB  2 g Intravenous Q24H Poindexter, Leann T, RPH       Chlorhexidine Gluconate Cloth 2 % PADS 6 each  6 each Topical Daily Countryman, Chase, MD   6 each at 11/05/22 0030   docusate sodium (COLACE) capsule 100 mg  100 mg Oral BID PRN Noemi Chapel P, DO       fentaNYL (SUBLIMAZE) injection 25-50 mcg  25-50 mcg Intravenous Q2H PRN Anders Simmonds, MD   50 mcg at 11/05/22 0338   heparin injection 5,000 Units  5,000 Units Subcutaneous Q8H  Julian Hy, DO   5,000 Units at 11/05/22 0635   iohexol (OMNIPAQUE) 300 MG/ML solution 50 mL  50 mL Per Tube Once PRN Markus Daft, MD       lactated ringers infusion   Intravenous  Continuous Julian Hy, DO 75 mL/hr at 11/05/22 3785 Infusion Verify at 11/05/22 0637   lidocaine (XYLOCAINE) 1 % (with pres) injection            norepinephrine (LEVOPHED) 14m in 2570m(0.016 mg/mL) premix infusion  2-10 mcg/min Intravenous Titrated ClJulian HyDO   Stopped at 11/05/22 068850 ondansetron (ZOFRAN) injection 4 mg  4 mg Intravenous Q6H PRN ClNoemi Chapel, DO   4 mg at 11/05/22 0148   Oral care mouth rinse  15 mL Mouth Rinse 4 times per day CoTretha SciaraMD       Oral care mouth rinse  15 mL Mouth Rinse PRN CoTretha SciaraMD       oxyCODONE (Oxy IR/ROXICODONE) immediate release tablet 5 mg  5 mg Oral Once CoTretha SciaraMD       polyethylene glycol (MIRALAX / GLYCOLAX) packet 17 g  17 g Oral Daily PRN ClNoemi Chapel, DO       sodium chloride flush (NS) 0.9 % injection 5 mL  5 mL Intracatheter Q8H HeMarkus DaftMD   5 mL at 11/05/22 0634   [START ON 11/06/2022] vancomycin (VANCOREADY) IVPB 1250 mg/250 mL  1,250 mg Intravenous Q48H Poindexter, Leann T, RPH         Objective: Vital signs in last 24 hours: BP 124/67   Pulse 97   Temp 98.3 F (36.8 C) (Oral)   Resp (!) 21   Ht _0  (1.778 m)   Wt 94.8 kg   SpO2 94%   BMI 29.99 kg/m   Intake/Output from previous day: 12/11 0701 - 12/12 0700 In: 1325.5 [I.V.:275.8; IV Piggyback:1049.7] Out: 150 [Urine:150] Intake/Output this shift: No intake/output data recorded.   Physical Exam Vitals reviewed.  Constitutional:      Appearance: He is ill-appearing.  Cardiovascular:     Rate and Rhythm: Regular rhythm. Tachycardia present.  Pulmonary:     Effort: Pulmonary effort is normal. No respiratory distress.  Abdominal:     Comments: Left NT draining pink urine.   Neurological:     Mental Status: He is alert.     Lab Results:  Results for orders placed or performed during the hospital encounter of 11/04/22 (from the past 24 hour(s))  Lactic acid, plasma     Status: Abnormal   Collection Time:  11/04/22  8:47 PM  Result Value Ref Range   Lactic Acid, Venous 7.9 (HH) 0.5 - 1.9 mmol/L  CBC with Differential     Status: Abnormal   Collection Time: 11/04/22  8:47 PM  Result Value Ref Range   WBC 21.1 (H) 4.0 - 10.5 K/uL   RBC 4.25 4.22 - 5.81 MIL/uL   Hemoglobin 14.0 13.0 - 17.0 g/dL   HCT 41.9 39.0 - 52.0 %   MCV 98.6 80.0 - 100.0 fL   MCH 32.9 26.0 - 34.0 pg   MCHC 33.4 30.0 - 36.0 g/dL   RDW 13.2 11.5 - 15.5 %   Platelets 129 (L) 150 - 400 K/uL   nRBC 0.1 0.0 - 0.2 %   Neutrophils Relative % 90 %   Neutro Abs 19.0 (H) 1.7 - 7.7 K/uL   Lymphocytes Relative 6 %   Lymphs Abs 1.2  0.7 - 4.0 K/uL   Monocytes Relative 3 %   Monocytes Absolute 0.6 0.1 - 1.0 K/uL   Eosinophils Relative 0 %   Eosinophils Absolute 0.0 0.0 - 0.5 K/uL   Basophils Relative 0 %   Basophils Absolute 0.1 0.0 - 0.1 K/uL   Immature Granulocytes 1 %   Abs Immature Granulocytes 0.22 (H) 0.00 - 0.07 K/uL  Comprehensive metabolic panel     Status: Abnormal   Collection Time: 11/04/22  8:47 PM  Result Value Ref Range   Sodium 140 135 - 145 mmol/L   Potassium 3.7 3.5 - 5.1 mmol/L   Chloride 99 98 - 111 mmol/L   CO2 23 22 - 32 mmol/L   Glucose, Bld 150 (H) 70 - 99 mg/dL   BUN 35 (H) 8 - 23 mg/dL   Creatinine, Ser 3.92 (H) 0.61 - 1.24 mg/dL   Calcium 9.4 8.9 - 10.3 mg/dL   Total Protein 6.9 6.5 - 8.1 g/dL   Albumin 4.1 3.5 - 5.0 g/dL   AST 40 15 - 41 U/L   ALT 14 0 - 44 U/L   Alkaline Phosphatase 49 38 - 126 U/L   Total Bilirubin 1.3 (H) 0.3 - 1.2 mg/dL   GFR, Estimated 15 (L) >60 mL/min   Anion gap 18 (H) 5 - 15  Lipase, blood     Status: None   Collection Time: 11/04/22  8:47 PM  Result Value Ref Range   Lipase 44 11 - 51 U/L  Lactic acid, plasma     Status: Abnormal   Collection Time: 11/04/22 10:50 PM  Result Value Ref Range   Lactic Acid, Venous 5.8 (HH) 0.5 - 1.9 mmol/L  C Difficile Quick Screen w PCR reflex     Status: None   Collection Time: 11/05/22 12:02 AM   Specimen: STOOL  Result  Value Ref Range   C Diff antigen NEGATIVE NEGATIVE   C Diff toxin NEGATIVE NEGATIVE   C Diff interpretation No C. difficile detected.   MRSA Next Gen by PCR, Nasal     Status: None   Collection Time: 11/05/22 12:02 AM   Specimen: Nasal Mucosa; Nasal Swab  Result Value Ref Range   MRSA by PCR Next Gen NOT DETECTED NOT DETECTED  SARS Coronavirus 2 by RT PCR (hospital order, performed in [facility] hospital lab) *cepheid single result test* Anterior Nasal Swab     Status: None   Collection Time: 11/05/22  1:28 AM   Specimen: Anterior Nasal Swab  Result Value Ref Range   SARS Coronavirus 2 by RT PCR NEGATIVE NEGATIVE  Glucose, capillary     Status: Abnormal   Collection Time: 11/05/22  1:37 AM  Result Value Ref Range   Glucose-Capillary 127 (H) 70 - 99 mg/dL  CBC     Status: Abnormal   Collection Time: 11/05/22  6:39 AM  Result Value Ref Range   WBC 11.8 (H) 4.0 - 10.5 K/uL   RBC 3.43 (L) 4.22 - 5.81 MIL/uL   Hemoglobin 11.3 (L) 13.0 - 17.0 g/dL   HCT 34.0 (L) 39.0 - 52.0 %   MCV 99.1 80.0 - 100.0 fL   MCH 32.9 26.0 - 34.0 pg   MCHC 33.2 30.0 - 36.0 g/dL   RDW 13.4 11.5 - 15.5 %   Platelets 96 (L) 150 - 400 K/uL   nRBC 0.0 0.0 - 0.2 %  Basic metabolic panel     Status: Abnormal   Collection Time: 11/05/22  6:39 AM  Result Value Ref Range   Sodium 139 135 - 145 mmol/L   Potassium 4.1 3.5 - 5.1 mmol/L   Chloride 107 98 - 111 mmol/L   CO2 22 22 - 32 mmol/L   Glucose, Bld 114 (H) 70 - 99 mg/dL   BUN 38 (H) 8 - 23 mg/dL   Creatinine, Ser 4.17 (H) 0.61 - 1.24 mg/dL   Calcium 8.1 (L) 8.9 - 10.3 mg/dL   GFR, Estimated 14 (L) >60 mL/min   Anion gap 10 5 - 15  Magnesium     Status: Abnormal   Collection Time: 11/05/22  6:39 AM  Result Value Ref Range   Magnesium 1.3 (L) 1.7 - 2.4 mg/dL  Phosphorus     Status: None   Collection Time: 11/05/22  6:39 AM  Result Value Ref Range   Phosphorus 4.2 2.5 - 4.6 mg/dL  Lactic acid, plasma     Status: Abnormal   Collection Time: 11/05/22   6:39 AM  Result Value Ref Range   Lactic Acid, Venous 2.7 (HH) 0.5 - 1.9 mmol/L  Hepatic function panel     Status: Abnormal   Collection Time: 11/05/22  6:39 AM  Result Value Ref Range   Total Protein 5.5 (L) 6.5 - 8.1 g/dL   Albumin 2.7 (L) 3.5 - 5.0 g/dL   AST 48 (H) 15 - 41 U/L   ALT 16 0 - 44 U/L   Alkaline Phosphatase 37 (L) 38 - 126 U/L   Total Bilirubin 1.0 0.3 - 1.2 mg/dL   Bilirubin, Direct 0.2 0.0 - 0.2 mg/dL   Indirect Bilirubin 0.8 0.3 - 0.9 mg/dL    BMET Recent Labs    11/04/22 2047 11/05/22 0639  NA 140 139  K 3.7 4.1  CL 99 107  CO2 23 22  GLUCOSE 150* 114*  BUN 35* 38*  CREATININE 3.92* 4.17*  CALCIUM 9.4 8.1*   PT/INR No results for input(s): "LABPROT", "INR" in the last 72 hours. ABG No results for input(s): "PHART", "HCO3" in the last 72 hours.  Invalid input(s): "PCO2", "PO2"  Studies/Results: CT ABDOMEN PELVIS WO CONTRAST  Result Date: 11/04/2022 CLINICAL DATA:  Abdominal pain, postop EXAM: CT ABDOMEN AND PELVIS WITHOUT CONTRAST TECHNIQUE: Multidetector CT imaging of the abdomen and pelvis was performed following the standard protocol without IV contrast. RADIATION DOSE REDUCTION: This exam was performed according to the departmental dose-optimization program which includes automated exposure control, adjustment of the mA and/or kV according to patient size and/or use of iterative reconstruction technique. COMPARISON:  05/29/2022 FINDINGS: Lower chest: No acute abnormality Hepatobiliary: Diffuse low-density throughout the liver compatible with fatty infiltration. No focal abnormality. Gallbladder unremarkable. Pancreas: No focal abnormality or ductal dilatation. Spleen: No focal abnormality.  Normal size. Adrenals/Urinary Tract: Adrenal glands normal. Severe left hydronephrosis and perinephric stranding due to 4 mm left UVJ stone. Multiple nonobstructing stones in the left kidney. No stones or hydronephrosis on the right. Urinary bladder decompressed,  grossly unremarkable. Stomach/Bowel: Sigmoid diverticulosis. No active diverticulitis. Stomach and small bowel decompressed, unremarkable. Vascular/Lymphatic: Aortic atherosclerosis. No evidence of aneurysm or adenopathy. Reproductive: No visible focal abnormality.   Penile implant noted. Other: No free fluid or free air. Musculoskeletal: No acute bony abnormality. IMPRESSION: Severe left hydronephrosis and perinephric stranding due to 4 mm left UVJ stone. Left nephrolithiasis. Hepatic steatosis. Aortic atherosclerosis. Sigmoid diverticulosis. Electronically Signed   By: Rolm Baptise M.D.   On: 11/04/2022 21:57   DG Chest 2 View  Result Date: 11/04/2022 CLINICAL DATA:  Shortness of breath EXAM: CHEST - 2 VIEW COMPARISON:  05/29/2022 FINDINGS: Cardiac shadow is stable. Lungs are well aerated bilaterally. No focal infiltrate or effusion is seen. No bony abnormality is noted. IMPRESSION: No acute abnormality seen. Electronically Signed   By: Inez Catalina M.D.   On: 11/04/2022 21:12     Assessment/Plan: Left UVJ stone with obstruction and sepsis.   He has been decompressed with the nephrostomy tube and will need left ureteroscopy in the next couple of weeks when recovered from the episode of sepsis.  He could have the nephrostomy tube internalized to a JJ stent in a few days when his condition improves.   AKI with a functionally solitary kidney and right renal atrophy.  Metastatic castrate senstive prostate cancer now on ADT and Abiraterone following prior RP and Salvage RTx.         No follow-ups on file.    CC: Dr. Noemi Chapel.     Irine Seal 11/05/2022

## 2022-11-05 NOTE — Progress Notes (Signed)
NAME:  ANTONIUS HARTLAGE, MRN:  [id number], DOB:  24-Sep-1946, LOS: 0 ADMISSION DATE:  11/04/2022, CONSULTATION DATE:  11/04/22 REFERRING MD:  Serita Kyle, CHIEF COMPLAINT:  sepsis, obstructing urinary stone   History of Present Illness:  Mr. Bun is a 76 year old gentleman with a history of CKD stage III, coronary artery disease, gout, bladder cancer, prostate adenocarcinoma, nephrolithiasis who presented from urgent care with hypotension and tachycardia.  He has been having pain from kidney stones since 12/10 with associated diarrhea.  He came to the ED and was found to have left hydronephrosis and pyelonephritis with an obstructing UPJ stone.  He was transferred from [place] ED to [facility] for evaluation.  Due to his extensive urologic history urology has recommended nephrostomy tube placement.  He follows with urologist Dr. Standley Dakins at the [facility]. He denies other new symptoms. Per chart review, he admitted to a weakly positive covid test in the ED triage area.  He has had several episodes of diarrhea since presenting to the ED.   Pertinent  Medical History  Prostate adenocarcinoma Bladder cancer CAD Kidney stones CKD 3 Gout Hypertension Hyperlipidemia  Significant Hospital Events: Including procedures, antibiotic start and stop dates in addition to other pertinent events   12/11 presented to the ED, transferred to [facility] overnight 12/12 off pressors  Interim History / Subjective:  Awake and interactive, mental status not at baseline according to spouse  Objective   Blood pressure (!) 141/71, pulse (!) 109, temperature 98.3 F (36.8 C), temperature source Oral, resp. rate (!) 24, height '5\' 10"'$  (1.778 m), weight 94.8 kg, SpO2 95 %.        Intake/Output Summary (Last 24 hours) at 11/05/2022 1112 Last data filed at 11/05/2022 0900 Gross per 24 hour  Intake 1325.48 ml  Output 250 ml  Net 1075.48 ml    Filed Weights   11/04/22 2022 11/05/22 0058 11/05/22 0435  Weight:  93 kg 94.8 kg 94.8 kg   Examination: General: Ill-appearing, HENT: Moist oral mucosa Lungs: On room air, clear to auscultation Cardiovascular: S1-S2 appreciated Abdomen: Soft, bowel sounds appreciated Extremities: No significant edema, no cyanosis Neuro: Awake, alert, moving all extremities Derm: Jaundiced, no diffuse rashes  BUN 35 Creatinine 3.92, increased to 4.17 Lactic acid 7.9> 5.8  CT abd pelvis personally reviewed: Left obstructing UVJ stone with severe hydronephrosis and perinephric stranding.    Resolved Hospital Problem list     Assessment & Plan:   Septic shock due to urinary tract infection Left pyelonephritis with obstructing UPJ stone Lactic acidosis-improving -Cultures negative at present -Continue current antibiotic therapy -Plan to de-escalate antibiotics following cultures  Hydronephrosis Obstructive pyelonephritis -S/p nephrostomy tube placement -Appreciate urology involvement -Plan will be to internalize to [place] stent once more stable  Acute kidney injury on chronic kidney disease -Continue IV fluids -Avoid nephrotoxic medications -Maintain renal perfusion -Need to monitor renal functions closely  Hyperglycemia -Sliding scale insulin as needed -Goal sugar of 673-419  Metabolic acidosis secondary to sepsis -Continue to support  Patient is DNR  May continue home meds including Zytiga  Best Practice (right click and "Reselect all SmartList Selections" daily)   Diet/type: Regular consistency (see orders) DVT prophylaxis: prophylactic heparin  GI prophylaxis: N/A Lines: N/A Foley:  N/A Code Status:  DNR Last date of multidisciplinary goals of care discussion '[ ]'$   Labs   CBC: Recent Labs  Lab 11/04/22 2047 11/05/22 0639  WBC 21.1* 11.8*  NEUTROABS 19.0*  --   HGB 14.0 11.3*  HCT 41.9  34.0*  MCV 98.6 99.1  PLT 129* 96*     Basic Metabolic Panel: Recent Labs  Lab 11/04/22 2047 11/05/22 0639  NA 140 139  K 3.7 4.1  CL 99  107  CO2 23 22  GLUCOSE 150* 114*  BUN 35* 38*  CREATININE 3.92* 4.17*  CALCIUM 9.4 8.1*  MG  --  1.3*  PHOS  --  4.2    GFR: Estimated Creatinine Clearance: 17.4 mL/min (A) (by C-G formula based on SCr of 4.17 mg/dL (H)). Recent Labs  Lab 11/04/22 2047 11/04/22 2250 11/05/22 0639  WBC 21.1*  --  11.8*  LATICACIDVEN 7.9* 5.8* 2.7*     Liver Function Tests: Recent Labs  Lab 11/04/22 2047 11/05/22 0639  AST 40 48*  ALT 14 16  ALKPHOS 49 37*  BILITOT 1.3* 1.0  PROT 6.9 5.5*  ALBUMIN 4.1 2.7*    Recent Labs  Lab 11/04/22 2047  LIPASE 44    No results for input(s): "AMMONIA" in the last 168 hours.  ABG    Component Value Date/Time   TCO2 25 12/30/2017 0649     Coagulation Profile: No results for input(s): "INR", "PROTIME" in the last 168 hours.  Cardiac Enzymes: No results for input(s): "CKTOTAL", "CKMB", "CKMBINDEX", "TROPONINI" in the last 168 hours.  HbA1C: Hgb A1c MFr Bld  Date/Time Value Ref Range Status  08/09/2011 11:29 AM 5.8 4.6 - 6.5 % Final    Comment:    Glycemic Control Guidelines for People with Diabetes:Non Diabetic:  <6%Goal of Therapy: <7%Additional Action Suggested:  >8%   01/19/2010 10:29 PM  4.6 - 6.1 % Final   5.6 (NOTE) The ADA recommends the following therapeutic goal for glycemic control related to Hgb A1c measurement: Goal of therapy: <6.5 Hgb A1c  Reference: American Diabetes Association: Clinical Practice Recommendations 2010, Diabetes Care, 2010, 33: (Suppl  1).    CBG: Recent Labs  Lab 11/05/22 0137  GLUCAP 127*    Review of Systems:   Review of Systems  Constitutional:  Positive for chills, fever and malaise/fatigue.  Respiratory:  Negative for sputum production.   Cardiovascular: Negative.   Gastrointestinal:  Positive for abdominal pain and diarrhea.  Genitourinary:  Positive for dysuria.     Past Medical History:  He,  has a past medical history of Atrophy of right kidney, CKD (chronic kidney disease),  stage III ([place]), CORONARY ARTERY DISEASE (CARDIOLOGIST--  DR WALL AND DR Angelena Form----   [facility] [place]), Cystitis, radiation, DDD (degenerative disc disease), lumbar, Diverticulosis of colon, GOUT (06/14/2008), H/O agent Orange exposure, History of bladder cancer (2016), History of kidney stones, History of myocardial infarction, History of recurrent UTIs, Hyperlipidemia, Hypertension, Nephrolithiasis, Nocturia, Normal exercise tolerance test results, OA (osteoarthritis), Organic impotence, PONV (postoperative nausea and vomiting), Recurrent prostate adenocarcinoma [facility]) (urologist--  dr Irine Seal---- per pt last PSA 0.12 as of 12-02-2017), Rosacea, S/P coronary artery stent placement, S/P radiation therapy (10/02/11 - 11/25/11), Spinal stenosis, SUI (stress urinary incontinence), male, and Wears glasses.   Surgical History:   Past Surgical History:  Procedure Laterality Date   ANTERIOR CERVICAL DECOMP/DISCECTOMY FUSION  2001   w/ removal bone spur--  1 level   BONE CYST EXCISION  2012   LUMBAR   CARDIOVASCULAR STRESS TEST  04-23-2011  dr Angelena Form   Low risk nuclear study/ fixed mixed basal inferior and inferolateral perfusion defect with wall motion abnormality that suggest prior MI, no significant ishemia/  ef 54%   COLONOSCOPY  03-04-2007  CORONARY ANGIOPLASTY  01-22-2010  dr Darnell Level brodie   successfully cutting angioplasty to in-stent restenosis  mCFX   CORONARY ANGIOPLASTY WITH STENT PLACEMENT  04-29-2003  dr Lyndel Safe   BMS x1 and DES x1 to mid left CFX for total occlusion 99%/  nonobstructive dLAD 60% and pLAD30%,  pRCA 30%,  lateral and mid inferior akinesis,  moderate irregularities LM,  D2 and D3 40%,  ef 50%   CYSTO W/ PLACEMENT RIGHT URETERAL STENT  07-19-2004   CYSTOSCOPY N/A 01/03/2015   Procedure: CYSTOSCOPY WITH FULGURATION OF BLEEDERS;  Surgeon: Malka So, MD;  [facility];  Service: Urology;  Laterality: N/A;   CYSTOSCOPY W/  URETERAL STENT PLACEMENT Left 01/05/2018   Procedure: CYSTOSCOPY WITH LEFT RETROGRADE PYELOGRAM/ LEFT URETEROSCOPY/STONE BASKET REMOVAL/ LEFT URETERAL STENT PLACEMENT;  Surgeon: Ceasar Mons, MD;  Location: [facility];  Service: Urology;  Laterality: Left;   CYSTOSCOPY WITH BIOPSY N/A 12/01/2014   Procedure: CYSTOSCOPY WITH BIOPSY/FULGURATION;  Surgeon: Malka So, MD;  Location: [facility];  Service: Urology;  Laterality: N/A;   CYSTOSCOPY WITH RETROGRADE PYELOGRAM, URETEROSCOPY AND STENT PLACEMENT Right 05/16/2016   Procedure: CYSTOSCOPY WITH RIGHT RETROGRADE PYELOGRAM, URETEROSCOPY WITH LASER AND STENT PLACEMENT;  Surgeon: Irine Seal, MD;  [facility];  Service: Urology;  Laterality: Right;   CYSTOSCOPY WITH STENT PLACEMENT Left 12/04/2017   Procedure: CYSTOSCOPY WITH STENT PLACEMENT;  Surgeon: Irine Seal, MD;  Location: [facility];  Service: Urology;  Laterality: Left;   CYSTOSCOPY/URETEROSCOPY/HOLMIUM LASER/STENT PLACEMENT Left 12/30/2017   Procedure: CYSTOSCOPY LEFT URETEROSCOPY WITH HOLMIUM LASER AND  LEFT STENT PLACEMENT;  Surgeon: Irine Seal, MD;  Location: [facility];  Service: Urology;  Laterality: Left;   EXTRACORPOREAL SHOCK WAVE LITHOTRIPSY Left 09-16-2013   EXTRACORPOREAL SHOCK WAVE LITHOTRIPSY Left 12/08/2017   Procedure: LEFT EXTRACORPOREAL SHOCK WAVE LITHOTRIPSY (ESWL);  Surgeon: Franchot Gallo, MD;  Location: [facility];  Service: Urology;  Laterality: Left;   HOLMIUM LASER APPLICATION Right 7/51/0258   Procedure: HOLMIUM LASER APPLICATION;  Surgeon: Irine Seal, MD;  Location: [facility];  Service: Urology;  Laterality: Right;   INGUINAL HERNIA REPAIR Left 11-03-2003   IR NEPHROSTOMY PLACEMENT LEFT  11/05/2022   LUMBAR [place]   LUMBAR LAMINECTOMY  04-25-2011   LUMBAR LAMINECTOMY/DECOMPRESSION MICRODISCECTOMY Right 11/29/2015   Procedure:  DECOMPRESSION LUMBAR LAMINECTOMY  L4-L5 MICRODISCECTOMY L4-L5 LEFT    EXCISION SENOVAL CYST LUMBAR 4-5 RIGHT  PARICAL FACETECTOMY LUMBAR 4-5 RIGHT ;  Surgeon: Latanya Maudlin, MD;  Location: [facility];  Service: Orthopedics;  Laterality: Right;   PENILE PROSTHESIS IMPLANT N/A 02/16/2013   Procedure: REPLACEMENT OF AMS PENILE PROTHESIS INFLATABLE;  Surgeon: Malka So, MD;  Location: [facility];  Service: Urology;  Laterality: N/A;   PENILE PROSTHESIS PLACEMENT  2003   RADICAL PROSTATECTOMY W/ BILATERAL PELVIC LYMPHADENECTOMY  08-07-2000   RIGHT URETEROSCOPIC LASER LITHOTRIPSY STONE EXTRACTION AND STENT  07-26-2004   TRANSTHORACIC ECHOCARDIOGRAM  01-21-2010   mild LVH/  ef 60-65%/  trivial TR     Social History:   reports that he quit smoking about 19 years ago. His smoking use included cigarettes. He has a 60.00 pack-year smoking history. He has never used smokeless tobacco. He reports current alcohol use of about 21.0 standard drinks of alcohol per week. He reports that he does not use drugs.   Family History:  His family history includes Heart attack in his father; Hypertension in an other family  member.   Allergies No Known Allergies   The patient is critically ill with multiple organ systems failure and requires high complexity decision making for assessment and support, frequent evaluation and titration of therapies, application of advanced monitoring technologies and extensive interpretation of multiple databases. Critical Care Time devoted to patient care services described in this note independent of APP/resident time (if applicable)  is 31 minutes.   Sherrilyn Rist MD [facility] Personal pager: See Amion If unanswered, please page CCM On-call: [phone]

## 2022-11-05 NOTE — Progress Notes (Signed)
PHARMACY - PHYSICIAN COMMUNICATION CRITICAL VALUE ALERT - BLOOD CULTURE IDENTIFICATION (BCID)  [name] is an 76 y.o. male who presented to [facility] on 11/04/2022 with sepsis and obstructive pyelonephritis.  Assessment:  BCID + for Serratia marcescens (no resistance detected) in 2 out of 4 bottles.  MRSA PCR is negative  Name of physician (or Provider) Contacted: Dr Oletta Darter  Current antibiotics: Vancomycin and Cefepime  Changes to prescribed antibiotics recommended:  D/C Vancomycin Continue Cefepime  Results for orders placed or performed during the hospital encounter of 11/04/22  Blood Culture ID Panel (Reflexed) (Collected: 11/04/2022  8:48 PM)  Result Value Ref Range   Enterococcus faecalis NOT DETECTED NOT DETECTED   Enterococcus Faecium NOT DETECTED NOT DETECTED   Listeria monocytogenes NOT DETECTED NOT DETECTED   Staphylococcus species NOT DETECTED NOT DETECTED   Staphylococcus aureus (BCID) NOT DETECTED NOT DETECTED   Staphylococcus epidermidis NOT DETECTED NOT DETECTED   Staphylococcus lugdunensis NOT DETECTED NOT DETECTED   Streptococcus species NOT DETECTED NOT DETECTED   Streptococcus agalactiae NOT DETECTED NOT DETECTED   Streptococcus pneumoniae NOT DETECTED NOT DETECTED   Streptococcus pyogenes NOT DETECTED NOT DETECTED   A.calcoaceticus-baumannii NOT DETECTED NOT DETECTED   Bacteroides fragilis NOT DETECTED NOT DETECTED   Enterobacterales DETECTED (A) NOT DETECTED   Enterobacter cloacae complex NOT DETECTED NOT DETECTED   Escherichia coli NOT DETECTED NOT DETECTED   Klebsiella aerogenes NOT DETECTED NOT DETECTED   Klebsiella oxytoca NOT DETECTED NOT DETECTED   Klebsiella pneumoniae NOT DETECTED NOT DETECTED   Proteus species NOT DETECTED NOT DETECTED   Salmonella species NOT DETECTED NOT DETECTED   Serratia marcescens DETECTED (A) NOT DETECTED   Haemophilus influenzae NOT DETECTED NOT DETECTED   Neisseria meningitidis NOT DETECTED NOT DETECTED    Pseudomonas aeruginosa NOT DETECTED NOT DETECTED   Stenotrophomonas maltophilia NOT DETECTED NOT DETECTED   Candida albicans NOT DETECTED NOT DETECTED   Candida auris NOT DETECTED NOT DETECTED   Candida glabrata NOT DETECTED NOT DETECTED   Candida krusei NOT DETECTED NOT DETECTED   Candida parapsilosis NOT DETECTED NOT DETECTED   Candida tropicalis NOT DETECTED NOT DETECTED   Cryptococcus neoformans/gattii NOT DETECTED NOT DETECTED   CTX-M ESBL NOT DETECTED NOT DETECTED   Carbapenem resistance IMP NOT DETECTED NOT DETECTED   Carbapenem resistance KPC NOT DETECTED NOT DETECTED   Carbapenem resistance NDM NOT DETECTED NOT DETECTED   Carbapenem resist OXA 48 LIKE NOT DETECTED NOT DETECTED   Carbapenem resistance VIM NOT DETECTED NOT DETECTED    Everette Rank, PharmD 11/05/2022  10:23 PM

## 2022-11-05 NOTE — Consult Note (Signed)
Chief Complaint: Patient was seen in consultation today for sepsis and left hydronephrosis  Referring Physician(s): Julian Hy, DO  Patient Status: [facility] - In-pt  History of Present Illness: [name] is a 76 y.o. male  with history of CKD stage III, coronary artery disease, gout, bladder cancer, prostate adenocarcinoma, nephrolithiasis who presented from urgent care with fevers and pain.   Found to have elevated lactic acid, tachycardiac, hypotension with severe left hydronephrosis due to an obstructing left UVJ stone.  Right kidney is atrophic and evidence for AKI.  He is lethargic.  Patient is accompanied by his wife.  Patient is a COVID rule out.  Consulted for emergent left nephrostomy tube placement due to urosepsis.   Past Medical History:  Diagnosis Date   Atrophy of right kidney    CKD (chronic kidney disease), stage III ([place])    CORONARY ARTERY DISEASE CARDIOLOGIST--  DR WALL AND DR Angelena Form----   currently under [facility] in [place]   ETT  05-06-2014,  no ischemia or st changes;  low risk nuclear study 04-23-2011--- a. s/p post wall MI  2004 => overlapping stents in the CFX (one Zeta stent and one Taxus DES);  b. [place] => LHC 2/11: Normal LM, normal LAD, normal Dx, CFX 80% ISR, PDA 30-40%, EF 55%. PCI: Cutting balloon angioplasty to the CFX ISR;  c.  Lex MV 5/12: inf and IL defect c/w prior MI, no ischemia, EF 54%   Cystitis, radiation    2013 following salvage therapy for recurrent prostate cancer post prostatectomy    DDD (degenerative disc disease), lumbar    Diverticulosis of colon    GOUT 06/14/2008   H/O agent Orange exposure    History of bladder cancer 2016   urologsit-  dr Jeffie Pollock   History of kidney stones    History of myocardial infarction    04-29-2003  -- posterior and lateral wall MI  s/p  stent x2 to  left mid CFX   History of recurrent UTIs    chronic   Hyperlipidemia    Hypertension    Nephrolithiasis    bilateral   Nocturia     Normal exercise tolerance test results    05-06-2014  Negative for ischemia GXT with the pt exercising to a 10 met work load and 89% APMHR without new diagnotic changes of ischemia   OA (osteoarthritis)    neck and shoulders   Organic impotence    arterial insuff.   PONV (postoperative nausea and vomiting)    Recurrent prostate adenocarcinoma [facility]) urologist--  dr Irine Seal---- per pt last PSA 0.12 as of 12-02-2017   first dx  2001,  T2b  N0  M0,  Gleason 6--  s/p  radical prostatectomy/   recurrent 11/ 2012  s/p salvage radiation    Rosacea    S/P coronary artery stent placement    BMS x1  and DES x1  to mid left CFX  04-29-2003   S/P radiation therapy 10/02/11 - 11/25/11   Central Lower Pelvis: 6840 cGy/38 Fractions--  for recurrent prostate cancer   Spinal stenosis    SUI (stress urinary incontinence), male    Wears glasses     Past Surgical History:  Procedure Laterality Date   ANTERIOR CERVICAL DECOMP/DISCECTOMY FUSION  2001   w/ removal bone spur--  1 level   BONE CYST EXCISION  2012   LUMBAR   CARDIOVASCULAR STRESS TEST  04-23-2011  dr Angelena Form   Low risk nuclear  study/ fixed mixed basal inferior and inferolateral perfusion defect with wall motion abnormality that suggest prior MI, no significant ishemia/  ef 54%   COLONOSCOPY  03-04-2007   CORONARY ANGIOPLASTY  01-22-2010  dr Darnell Level brodie   successfully cutting angioplasty to in-stent restenosis  mCFX   CORONARY ANGIOPLASTY WITH STENT PLACEMENT  04-29-2003  dr Lyndel Safe   BMS x1 and DES x1 to mid left CFX for total occlusion 99%/  nonobstructive dLAD 60% and pLAD30%,  pRCA 30%,  lateral and mid inferior akinesis,  moderate irregularities LM,  D2 and D3 40%,  ef 50%   CYSTO W/ PLACEMENT RIGHT URETERAL STENT  07-19-2004   CYSTOSCOPY N/A 01/03/2015   Procedure: CYSTOSCOPY WITH FULGURATION OF BLEEDERS;  Surgeon: Malka So, MD;  Location: [facility];  Service: Urology;  Laterality: N/A;   CYSTOSCOPY W/ URETERAL  STENT PLACEMENT Left 01/05/2018   Procedure: CYSTOSCOPY WITH LEFT RETROGRADE PYELOGRAM/ LEFT URETEROSCOPY/STONE BASKET REMOVAL/ LEFT URETERAL STENT PLACEMENT;  Surgeon: Ceasar Mons, MD;  Location: [facility];  Service: Urology;  Laterality: Left;   CYSTOSCOPY WITH BIOPSY N/A 12/01/2014   Procedure: CYSTOSCOPY WITH BIOPSY/FULGURATION;  Surgeon: Malka So, MD;  Location: [facility];  Service: Urology;  Laterality: N/A;   CYSTOSCOPY WITH RETROGRADE PYELOGRAM, URETEROSCOPY AND STENT PLACEMENT Right 05/16/2016   Procedure: CYSTOSCOPY WITH RIGHT RETROGRADE PYELOGRAM, URETEROSCOPY WITH LASER AND STENT PLACEMENT;  Surgeon: Irine Seal, MD;  Location: [facility];  Service: Urology;  Laterality: Right;   CYSTOSCOPY WITH STENT PLACEMENT Left 12/04/2017   Procedure: CYSTOSCOPY WITH STENT PLACEMENT;  Surgeon: Irine Seal, MD;  Location: [facility];  Service: Urology;  Laterality: Left;   CYSTOSCOPY/URETEROSCOPY/HOLMIUM LASER/STENT PLACEMENT Left 12/30/2017   Procedure: CYSTOSCOPY LEFT URETEROSCOPY WITH HOLMIUM LASER AND  LEFT STENT PLACEMENT;  Surgeon: Irine Seal, MD;  Location: [facility]  Service: Urology;  Laterality: Left;   EXTRACORPOREAL SHOCK WAVE LITHOTRIPSY Left 09-16-2013   EXTRACORPOREAL SHOCK WAVE LITHOTRIPSY Left 12/08/2017   Procedure: LEFT EXTRACORPOREAL SHOCK WAVE LITHOTRIPSY (ESWL);  Surgeon: Franchot Gallo, MD;  Location: [facility];  Service: Urology;  Laterality: Left;   HOLMIUM LASER APPLICATION Right 01/30/6577   Procedure: HOLMIUM LASER APPLICATION;  Surgeon: Irine Seal, MD;  Location: [facility];  Service: Urology;  Laterality: Right;   INGUINAL HERNIA REPAIR Left 11-03-2003   LUMBAR [place]   LUMBAR LAMINECTOMY  04-25-2011   LUMBAR LAMINECTOMY/DECOMPRESSION MICRODISCECTOMY Right 11/29/2015   Procedure:  DECOMPRESSION LUMBAR LAMINECTOMY L4-L5 MICRODISCECTOMY L4-L5 LEFT    EXCISION SENOVAL CYST  LUMBAR 4-5 RIGHT  PARICAL FACETECTOMY LUMBAR 4-5 RIGHT ;  Surgeon: Latanya Maudlin, MD;  Location: [facility];  Service: Orthopedics;  Laterality: Right;   PENILE PROSTHESIS IMPLANT N/A 02/16/2013   Procedure: REPLACEMENT OF AMS PENILE PROTHESIS INFLATABLE;  Surgeon: Malka So, MD;  Location: [facility];  Service: Urology;  Laterality: N/A;   PENILE PROSTHESIS PLACEMENT  2003   RADICAL PROSTATECTOMY W/ BILATERAL PELVIC LYMPHADENECTOMY  08-07-2000   RIGHT URETEROSCOPIC LASER LITHOTRIPSY STONE EXTRACTION AND STENT  07-26-2004   TRANSTHORACIC ECHOCARDIOGRAM  01-21-2010   mild LVH/  ef 60-65%/  trivial TR    Allergies: Patient has no known allergies.  Medications: Prior to Admission medications   Medication Sig Start Date End Date Taking? Authorizing Provider  abiraterone acetate (ZYTIGA) 250 MG tablet Take 1,000 mg by mouth daily. Take on an empty stomach   Yes [provider]  predniSONE (DELTASONE) 5  MG tablet Take 5 mg by mouth daily.   Yes [provider]  acetaminophen (TYLENOL) 500 MG tablet Take 1,000 mg by mouth daily as needed for headache.    [provider]  allopurinol (ZYLOPRIM) 300 MG tablet Take 300 mg by mouth daily.    [provider]  aspirin EC 81 MG tablet Take 81 mg by mouth daily.    [provider]  atorvastatin (LIPITOR) 80 MG tablet Take 40 mg by mouth daily.    [provider]  Cholecalciferol (VITAMIN D3) 25 MCG (1000 UT) CAPS Take 1,000 Units by mouth daily.    [provider]  COVID-19 mRNA bivalent vaccine, Pfizer, (PFIZER COVID-19 VAC BIVALENT) injection Inject into the muscle. 09/12/21   Carlyle Basques, MD  gabapentin (NEURONTIN) 300 MG capsule Take 600 mg by mouth daily.    [provider]  metoprolol tartrate (LOPRESSOR) 25 MG tablet Take 12.5 mg by mouth every evening.  Patient not taking: Reported on 05/29/2022    [provider]  Multiple Vitamin (MULTIVITAMIN PO) Take 1 tablet by  mouth daily.    [provider]  nitroGLYCERIN (NITROSTAT) 0.4 MG SL tablet Place 1 tablet (0.4 mg total) under the tongue every 5 (five) minutes as needed for chest pain. 10/06/13   Plotnikov, Evie Lacks, MD  Omega-3 Fatty Acids (FISH OIL) 1200 MG CAPS Take 1,200 mg by mouth daily.    [provider]  ondansetron (ZOFRAN-ODT) 4 MG disintegrating tablet Take 1 tablet (4 mg total) by mouth every 8 (eight) hours as needed for nausea or vomiting. 05/29/22   Davonna Belling, MD  OVER THE COUNTER MEDICATION Take 750 mg by mouth daily. Osteo Bi Flex Glucosamin    [provider]  OVER THE COUNTER MEDICATION Take 1 tablet by mouth daily. Calcium 1200 mg + D3 25 mcg (2000 units)    [provider]  triamcinolone cream (KENALOG) 0.1 % Apply 1 Application topically daily.    [provider]  vitamin C (ASCORBIC ACID) 500 MG tablet Take 500 mg by mouth daily.    [provider]  Zinc 50 MG TABS Take 50 mg by mouth daily in the afternoon.    [provider]     Family History  Problem Relation Age of Onset   Hypertension Other    Heart attack Father     Social History   Socioeconomic History   Marital status: Married    Spouse name: Not on file   Number of children: 5   Years of education: Not on file   Highest education level: Not on file  Occupational History   Occupation: Merchandiser, retail    Employer: [employer]  Tobacco Use   Smoking status: Former    Packs/day: 1.50    Years: 40.00    Total pack years: 60.00    Types: Cigarettes    Quit date: 11/28/2002    Years since quitting: 19.9   Smokeless tobacco: Never  Vaping Use   Vaping Use: Never used  Substance and Sexual Activity   Alcohol use: Yes    Alcohol/week: 21.0 standard drinks of alcohol    Types: 21 Shots of liquor per week    Comment: 3 oz  per daily   Drug use: No   Sexual activity: Yes  Other Topics Concern   Not on file  Social  History Narrative   Not on file   Social Determinants of Health   Financial Resource Strain:  Not on file  Food Insecurity: Not on file  Transportation Needs: Not on file  Physical Activity: Not on file  Stress: Not on file  Social Connections: Not on file      Review of Systems  Constitutional:  Positive for fever.  Gastrointestinal:  Positive for abdominal pain and nausea.    Vital Signs: BP 103/62 (BP Location: Left Arm)   Pulse (!) 111   Temp 98.2 F (36.8 C) (Oral)   Resp 18   Ht _0  (1.778 m)   Wt 94.8 kg   SpO2 92%   BMI 29.99 kg/m     Physical Exam Constitutional:      Appearance: He is ill-appearing.  Cardiovascular:     Rate and Rhythm: Tachycardia present.  Pulmonary:     Effort: Pulmonary effort is normal.     Breath sounds: Normal breath sounds.  Abdominal:     General: Abdomen is flat.     Palpations: Abdomen is soft.     Imaging: CT ABDOMEN PELVIS WO CONTRAST  Result Date: 11/04/2022 CLINICAL DATA:  Abdominal pain, postop EXAM: CT ABDOMEN AND PELVIS WITHOUT CONTRAST TECHNIQUE: Multidetector CT imaging of the abdomen and pelvis was performed following the standard protocol without IV contrast. RADIATION DOSE REDUCTION: This exam was performed according to the departmental dose-optimization program which includes automated exposure control, adjustment of the mA and/or kV according to patient size and/or use of iterative reconstruction technique. COMPARISON:  05/29/2022 FINDINGS: Lower chest: No acute abnormality Hepatobiliary: Diffuse low-density throughout the liver compatible with fatty infiltration. No focal abnormality. Gallbladder unremarkable. Pancreas: No focal abnormality or ductal dilatation. Spleen: No focal abnormality.  Normal size. Adrenals/Urinary Tract: Adrenal glands normal. Severe left hydronephrosis and perinephric stranding due to 4 mm left UVJ stone. Multiple nonobstructing stones in the left kidney. No stones or hydronephrosis on  the right. Urinary bladder decompressed, grossly unremarkable. Stomach/Bowel: Sigmoid diverticulosis. No active diverticulitis. Stomach and small bowel decompressed, unremarkable. Vascular/Lymphatic: Aortic atherosclerosis. No evidence of aneurysm or adenopathy. Reproductive: No visible focal abnormality.   Penile implant noted. Other: No free fluid or free air. Musculoskeletal: No acute bony abnormality. IMPRESSION: Severe left hydronephrosis and perinephric stranding due to 4 mm left UVJ stone. Left nephrolithiasis. Hepatic steatosis. Aortic atherosclerosis. Sigmoid diverticulosis. Electronically Signed   By: Rolm Baptise M.D.   On: 11/04/2022 21:57   DG Chest 2 View  Result Date: 11/04/2022 CLINICAL DATA:  Shortness of breath EXAM: CHEST - 2 VIEW COMPARISON:  05/29/2022 FINDINGS: Cardiac shadow is stable. Lungs are well aerated bilaterally. No focal infiltrate or effusion is seen. No bony abnormality is noted. IMPRESSION: No acute abnormality seen. Electronically Signed   By: Inez Catalina M.D.   On: 11/04/2022 21:12    Labs:  CBC: Recent Labs    05/29/22 0923 11/04/22 2047  WBC 7.8 21.1*  HGB 17.4* 14.0  HCT 52.5* 41.9  PLT 141* 129*    COAGS: No results for input(s): "INR", "APTT" in the last 8760 hours.  BMP: Recent Labs    05/29/22 0923 11/04/22 2047  NA 140 140  K 3.7 3.7  CL 103 99  CO2 27 23  GLUCOSE 130* 150*  BUN 18 35*  CALCIUM 10.1 9.4  CREATININE 1.30* 3.92*  GFRNONAA 57* 15*    LIVER FUNCTION TESTS: Recent Labs    05/29/22 0937 11/04/22 2047  BILITOT 1.1 1.3*  AST 52* 40  ALT 23 14  ALKPHOS 67 49  PROT 8.2* 6.9  ALBUMIN 4.4 4.1  Assessment and Plan:  76 yo male with urosepsis and left hydronephrosis secondary to an obstructing 4 mm stone at the left UVJ.  Patient has an atrophic right kidney and AKI.  Patient needs an emergent left nephrostomy tube for decompression of the left renal collecting system.    Risks and benefits of left  nephrostomy tube placement was discussed with the patient and wife including, but not limited to, infection, bleeding, significant bleeding causing loss or decrease in renal function or damage to adjacent structures.   All of the patient's questions were answered, consent signed by patient's wife and in chart.   Electronically Signed: Burman Riis, MD 11/05/2022, 1:46 AM   I spent a total of 20 Minutes  in face to face in clinical consultation, greater than 50% of which was counseling/coordinating care for left hydronephrosis

## 2022-11-05 NOTE — Procedures (Signed)
Interventional Radiology Procedure:   Indications: Urosepsis and left hydronephrosis  Procedure: Left nephrostomy tube placement   Findings: Left hydronephrosis (moderate).  10 Fr nephrostomy tube placed and removed cloudy purulent looking urine.  Fluid sent for culture.    Complications: No immediate complications noted.     EBL: Minimal  Plan: Return to ICU.  Fluid sent for culture.    Joyce Heitman R. Anselm Pancoast, MD  Pager: [phone]

## 2022-11-05 NOTE — Consult Note (Signed)
Subjective: 1. Obstructive pyelonephritis      Consult requested by Dr. Noemi Chapel  [name] is a 76 yo male who is well known to me with a history of prostate cancer treated with RP in 2006 and then with subsequent salvage radiation therapy.  He has been on ADT with Eligard and Abiaterone for pelvic nodal mets for over a year.  He has a history of stones with multiple prior procedures and has atrophy of the right kidney.   He presented yesterday with the onset of left flank pain on 12/10 which was followed by the onset of diarrhea, fever and chills.  He went to the urgent care and then to [facility] ER where he was found to be severely septic with a 89m obstructing stone at the left UVJ.   After reviewing the films and his history I recommended urgent nephrostomy tube placement because of concerns about the impact of his prior prostate cancer treatment on cystoscopy and stent insertion success probabilities.   The NT was successfully placed last night. ROS:  Review of Systems  Constitutional:  Positive for chills and fever.  Gastrointestinal:  Positive for diarrhea.  Genitourinary:  Positive for flank pain.    No Known Allergies  Past Medical History:  Diagnosis Date   Atrophy of right kidney    CKD (chronic kidney disease), stage III ([facility]    CORONARY ARTERY DISEASE CARDIOLOGIST--  DR WALL AND DR MAngelena Form---   [facility] [place]  05-06-2014,  no ischemia or st changes;  low risk nuclear study 04-23-2011--- a. s/p post wall MI  2004 => overlapping stents in the CFX (one Zeta stent and one Taxus DES);  b. [place] LHC 2/11: Normal LM, normal LAD, normal Dx, CFX 80% ISR, PDA 30-40%, EF 55%. PCI: Cutting balloon angioplasty to the CFX ISR;  c.  Lex MV 5/12: inf and IL defect c/w prior MI, no ischemia, EF 54%   Cystitis, radiation    2013 following salvage therapy for recurrent prostate cancer post prostatectomy    DDD (degenerative disc disease), lumbar     Diverticulosis of colon    GOUT 06/14/2008   H/O agent Orange exposure    History of bladder cancer 2016   urologsit-  dr wJeffie Pollock  History of kidney stones    History of myocardial infarction    04-29-2003  -- posterior and lateral wall MI  s/p  stent x2 to  left mid CFX   History of recurrent UTIs    chronic   Hyperlipidemia    Hypertension    Nephrolithiasis    bilateral   Nocturia    Normal exercise tolerance test results    05-06-2014  Negative for ischemia GXT with the pt exercising to a 10 met work load and 89% APMHR without new diagnotic changes of ischemia   OA (osteoarthritis)    neck and shoulders   Organic impotence    arterial insuff.   PONV (postoperative nausea and vomiting)    Recurrent prostate adenocarcinoma ([facility] urologist--  dr jIrine Seal--- per pt last PSA 0.12 as of 12-02-2017   first dx  2001,  T2b  N0  M0,  Gleason 6--  s/p  radical prostatectomy/   recurrent 11/ 2012  s/p salvage radiation    Rosacea    S/P coronary artery stent placement    BMS x1  and DES x1  to mid left CFX  04-29-2003   S/P radiation therapy  10/02/11 - 11/25/11   Central Lower Pelvis: 0932 cGy/38 Fractions--  for recurrent prostate cancer   Spinal stenosis    SUI (stress urinary incontinence), male    Wears glasses     Past Surgical History:  Procedure Laterality Date   ANTERIOR CERVICAL DECOMP/DISCECTOMY FUSION  2001   w/ removal bone spur--  1 level   BONE CYST EXCISION  2012   LUMBAR   CARDIOVASCULAR STRESS TEST  04-23-2011  dr Angelena Form   Low risk nuclear study/ fixed mixed basal inferior and inferolateral perfusion defect with wall motion abnormality that suggest prior MI, no significant ishemia/  ef 54%   COLONOSCOPY  03-04-2007   CORONARY ANGIOPLASTY  01-22-2010  dr Darnell Level brodie   successfully cutting angioplasty to in-stent restenosis  mCFX   CORONARY ANGIOPLASTY WITH STENT PLACEMENT  04-29-2003  dr Lyndel Safe   BMS x1 and DES x1 to mid left CFX for total occlusion 99%/   nonobstructive dLAD 60% and pLAD30%,  pRCA 30%,  lateral and mid inferior akinesis,  moderate irregularities LM,  D2 and D3 40%,  ef 50%   CYSTO W/ PLACEMENT RIGHT URETERAL STENT  07-19-2004   CYSTOSCOPY N/A 01/03/2015   Procedure: CYSTOSCOPY WITH FULGURATION OF BLEEDERS;  Surgeon: Malka So, MD;  Location: [facility];  Service: Urology;  Laterality: N/A;   CYSTOSCOPY W/ URETERAL STENT PLACEMENT Left 01/05/2018   Procedure: CYSTOSCOPY WITH LEFT RETROGRADE PYELOGRAM/ LEFT URETEROSCOPY/STONE BASKET REMOVAL/ LEFT URETERAL STENT PLACEMENT;  Surgeon: Ceasar Mons, MD;  Location: [facility];  Service: Urology;  Laterality: Left;   CYSTOSCOPY WITH BIOPSY N/A 12/01/2014   Procedure: CYSTOSCOPY WITH BIOPSY/FULGURATION;  Surgeon: Malka So, MD;  Location: [facility];  Service: Urology;  Laterality: N/A;   CYSTOSCOPY WITH RETROGRADE PYELOGRAM, URETEROSCOPY AND STENT PLACEMENT Right 05/16/2016   Procedure: CYSTOSCOPY WITH RIGHT RETROGRADE PYELOGRAM, URETEROSCOPY WITH LASER AND STENT PLACEMENT;  Surgeon: Irine Seal, MD;  Location: [facility];  Service: Urology;  Laterality: Right;   CYSTOSCOPY WITH STENT PLACEMENT Left 12/04/2017   Procedure: CYSTOSCOPY WITH STENT PLACEMENT;  Surgeon: Irine Seal, MD;  Location: [facility];  Service: Urology;  Laterality: Left;   CYSTOSCOPY/URETEROSCOPY/HOLMIUM LASER/STENT PLACEMENT Left 12/30/2017   Procedure: CYSTOSCOPY LEFT URETEROSCOPY WITH HOLMIUM LASER AND  LEFT STENT PLACEMENT;  Surgeon: Irine Seal, MD;  Location: [facility];  Service: Urology;  Laterality: Left;   EXTRACORPOREAL SHOCK WAVE LITHOTRIPSY Left 09-16-2013   EXTRACORPOREAL SHOCK WAVE LITHOTRIPSY Left 12/08/2017   Procedure: LEFT EXTRACORPOREAL SHOCK WAVE LITHOTRIPSY (ESWL);  Surgeon: Franchot Gallo, MD;  Location: [facility];  Service: Urology;  Laterality: Left;   HOLMIUM LASER APPLICATION Right 3/55/7322   Procedure:  HOLMIUM LASER APPLICATION;  Surgeon: Irine Seal, MD;  Location: [facility];  Service: Urology;  Laterality: Right;   INGUINAL HERNIA REPAIR Left 11-03-2003   LUMBAR [place]   LUMBAR LAMINECTOMY  04-25-2011   LUMBAR LAMINECTOMY/DECOMPRESSION MICRODISCECTOMY Right 11/29/2015   Procedure:  DECOMPRESSION LUMBAR LAMINECTOMY L4-L5 MICRODISCECTOMY L4-L5 LEFT    EXCISION SENOVAL CYST LUMBAR 4-5 RIGHT  PARICAL FACETECTOMY LUMBAR 4-5 RIGHT ;  Surgeon: Latanya Maudlin, MD;  Location: [facility];  Service: Orthopedics;  Laterality: Right;   PENILE PROSTHESIS IMPLANT N/A 02/16/2013   Procedure: REPLACEMENT OF AMS PENILE PROTHESIS INFLATABLE;  Surgeon: Malka So, MD;  Location: [facility];  Service: Urology;  Laterality: N/A;   PENILE PROSTHESIS PLACEMENT  2003   RADICAL PROSTATECTOMY W/ BILATERAL  PELVIC LYMPHADENECTOMY  08-07-2000   RIGHT URETEROSCOPIC LASER LITHOTRIPSY STONE EXTRACTION AND STENT  07-26-2004   TRANSTHORACIC ECHOCARDIOGRAM  01-21-2010   mild LVH/  ef 60-65%/  trivial TR    Social History   Socioeconomic History   Marital status: Married    Spouse name: Not on file   Number of children: 5   Years of education: Not on file   Highest education level: Not on file  Occupational History   Occupation: Merchandiser, retail    Employer: [employer]  Tobacco Use   Smoking status: Former    Packs/day: 1.50    Years: 40.00    Total pack years: 60.00    Types: Cigarettes    Quit date: 11/28/2002    Years since quitting: 19.9   Smokeless tobacco: Never  Vaping Use   Vaping Use: Never used  Substance and Sexual Activity   Alcohol use: Yes    Alcohol/week: 21.0 standard drinks of alcohol    Types: 21 Shots of liquor per week    Comment: 3 oz  per daily   Drug use: No   Sexual activity: Yes  Other Topics Concern   Not on file  Social History Narrative   Not on file   Social Determinants of Health   Financial Resource Strain: Not on file  Food  Insecurity: Not on file  Transportation Needs: Not on file  Physical Activity: Not on file  Stress: Not on file  Social Connections: Not on file  Intimate Partner Violence: Not on file    Family History  Problem Relation Age of Onset   Hypertension Other    Heart attack Father     Anti-infectives: Anti-infectives (From admission, onward)    Start     Dose/Rate Route Frequency Ordered Stop   11/06/22 1100  vancomycin (VANCOREADY) IVPB 1250 mg/250 mL        1,250 mg 166.7 mL/hr over 90 Minutes Intravenous Every 48 hours 11/05/22 0054     11/05/22 2200  ceFEPIme (MAXIPIME) 2 g in sodium chloride 0.9 % 100 mL IVPB        2 g 200 mL/hr over 30 Minutes Intravenous Every 24 hours 11/05/22 0054     11/04/22 2130  vancomycin (VANCOCIN) IVPB 1000 mg/200 mL premix        1,000 mg 200 mL/hr over 60 Minutes Intravenous Every hour 11/04/22 2046 11/04/22 2347   11/04/22 2100  ceFEPIme (MAXIPIME) 2 g in sodium chloride 0.9 % 100 mL IVPB        2 g 200 mL/hr over 30 Minutes Intravenous  Once 11/04/22 2046 11/04/22 2150       Current Facility-Administered Medications  Medication Dose Route Frequency Provider Last Rate Last Admin   0.9 %  sodium chloride infusion  250 mL Intravenous Continuous Noemi Chapel P, DO       ceFEPIme (MAXIPIME) 2 g in sodium chloride 0.9 % 100 mL IVPB  2 g Intravenous Q24H Poindexter, Leann T, RPH       Chlorhexidine Gluconate Cloth 2 % PADS 6 each  6 each Topical Daily Countryman, Chase, MD   6 each at 11/05/22 0030   docusate sodium (COLACE) capsule 100 mg  100 mg Oral BID PRN Noemi Chapel P, DO       fentaNYL (SUBLIMAZE) injection 25-50 mcg  25-50 mcg Intravenous Q2H PRN Anders Simmonds, MD   50 mcg at 11/05/22 0338   heparin injection 5,000 Units  5,000 Units Subcutaneous Q8H  Julian Hy, DO   5,000 Units at 11/05/22 0635   iohexol (OMNIPAQUE) 300 MG/ML solution 50 mL  50 mL Per Tube Once PRN Markus Daft, MD       lactated ringers infusion   Intravenous  Continuous Julian Hy, DO 75 mL/hr at 11/05/22 2831 Infusion Verify at 11/05/22 0637   lidocaine (XYLOCAINE) 1 % (with pres) injection            norepinephrine (LEVOPHED) 6m in 2552m(0.016 mg/mL) premix infusion  2-10 mcg/min Intravenous Titrated ClJulian HyDO   Stopped at 11/05/22 065176 ondansetron (ZOFRAN) injection 4 mg  4 mg Intravenous Q6H PRN ClNoemi Chapel, DO   4 mg at 11/05/22 0148   Oral care mouth rinse  15 mL Mouth Rinse 4 times per day CoTretha SciaraMD       Oral care mouth rinse  15 mL Mouth Rinse PRN CoTretha SciaraMD       oxyCODONE (Oxy IR/ROXICODONE) immediate release tablet 5 mg  5 mg Oral Once CoTretha SciaraMD       polyethylene glycol (MIRALAX / GLYCOLAX) packet 17 g  17 g Oral Daily PRN ClNoemi Chapel, DO       sodium chloride flush (NS) 0.9 % injection 5 mL  5 mL Intracatheter Q8H HeMarkus DaftMD   5 mL at 11/05/22 0634   [START ON 11/06/2022] vancomycin (VANCOREADY) IVPB 1250 mg/250 mL  1,250 mg Intravenous Q48H Poindexter, Leann T, RPH         Objective: Vital signs in last 24 hours: BP 124/67   Pulse 97   Temp 98.3 F (36.8 C) (Oral)   Resp (!) 21   Ht _0  (1.778 m)   Wt 94.8 kg   SpO2 94%   BMI 29.99 kg/m   Intake/Output from previous day: 12/11 0701 - 12/12 0700 In: 1325.5 [I.V.:275.8; IV Piggyback:1049.7] Out: 150 [Urine:150] Intake/Output this shift: No intake/output data recorded.   Physical Exam Vitals reviewed.  Constitutional:      Appearance: He is ill-appearing.  Cardiovascular:     Rate and Rhythm: Regular rhythm. Tachycardia present.  Pulmonary:     Effort: Pulmonary effort is normal. No respiratory distress.  Abdominal:     Comments: Left NT draining pink urine.   Neurological:     Mental Status: He is alert.     Lab Results:  Results for orders placed or performed during the hospital encounter of 11/04/22 (from the past 24 hour(s))  Lactic acid, plasma     Status: Abnormal   Collection Time:  11/04/22  8:47 PM  Result Value Ref Range   Lactic Acid, Venous 7.9 (HH) 0.5 - 1.9 mmol/L  CBC with Differential     Status: Abnormal   Collection Time: 11/04/22  8:47 PM  Result Value Ref Range   WBC 21.1 (H) 4.0 - 10.5 K/uL   RBC 4.25 4.22 - 5.81 MIL/uL   Hemoglobin 14.0 13.0 - 17.0 g/dL   HCT 41.9 39.0 - 52.0 %   MCV 98.6 80.0 - 100.0 fL   MCH 32.9 26.0 - 34.0 pg   MCHC 33.4 30.0 - 36.0 g/dL   RDW 13.2 11.5 - 15.5 %   Platelets 129 (L) 150 - 400 K/uL   nRBC 0.1 0.0 - 0.2 %   Neutrophils Relative % 90 %   Neutro Abs 19.0 (H) 1.7 - 7.7 K/uL   Lymphocytes Relative 6 %   Lymphs Abs 1.2  0.7 - 4.0 K/uL   Monocytes Relative 3 %   Monocytes Absolute 0.6 0.1 - 1.0 K/uL   Eosinophils Relative 0 %   Eosinophils Absolute 0.0 0.0 - 0.5 K/uL   Basophils Relative 0 %   Basophils Absolute 0.1 0.0 - 0.1 K/uL   Immature Granulocytes 1 %   Abs Immature Granulocytes 0.22 (H) 0.00 - 0.07 K/uL  Comprehensive metabolic panel     Status: Abnormal   Collection Time: 11/04/22  8:47 PM  Result Value Ref Range   Sodium 140 135 - 145 mmol/L   Potassium 3.7 3.5 - 5.1 mmol/L   Chloride 99 98 - 111 mmol/L   CO2 23 22 - 32 mmol/L   Glucose, Bld 150 (H) 70 - 99 mg/dL   BUN 35 (H) 8 - 23 mg/dL   Creatinine, Ser 3.92 (H) 0.61 - 1.24 mg/dL   Calcium 9.4 8.9 - 10.3 mg/dL   Total Protein 6.9 6.5 - 8.1 g/dL   Albumin 4.1 3.5 - 5.0 g/dL   AST 40 15 - 41 U/L   ALT 14 0 - 44 U/L   Alkaline Phosphatase 49 38 - 126 U/L   Total Bilirubin 1.3 (H) 0.3 - 1.2 mg/dL   GFR, Estimated 15 (L) >60 mL/min   Anion gap 18 (H) 5 - 15  Lipase, blood     Status: None   Collection Time: 11/04/22  8:47 PM  Result Value Ref Range   Lipase 44 11 - 51 U/L  Lactic acid, plasma     Status: Abnormal   Collection Time: 11/04/22 10:50 PM  Result Value Ref Range   Lactic Acid, Venous 5.8 (HH) 0.5 - 1.9 mmol/L  C Difficile Quick Screen w PCR reflex     Status: None   Collection Time: 11/05/22 12:02 AM   Specimen: STOOL  Result  Value Ref Range   C Diff antigen NEGATIVE NEGATIVE   C Diff toxin NEGATIVE NEGATIVE   C Diff interpretation No C. difficile detected.   MRSA Next Gen by PCR, Nasal     Status: None   Collection Time: 11/05/22 12:02 AM   Specimen: Nasal Mucosa; Nasal Swab  Result Value Ref Range   MRSA by PCR Next Gen NOT DETECTED NOT DETECTED  SARS Coronavirus 2 by RT PCR (hospital order, performed in [facility] hospital lab) *cepheid single result test* Anterior Nasal Swab     Status: None   Collection Time: 11/05/22  1:28 AM   Specimen: Anterior Nasal Swab  Result Value Ref Range   SARS Coronavirus 2 by RT PCR NEGATIVE NEGATIVE  Glucose, capillary     Status: Abnormal   Collection Time: 11/05/22  1:37 AM  Result Value Ref Range   Glucose-Capillary 127 (H) 70 - 99 mg/dL  CBC     Status: Abnormal   Collection Time: 11/05/22  6:39 AM  Result Value Ref Range   WBC 11.8 (H) 4.0 - 10.5 K/uL   RBC 3.43 (L) 4.22 - 5.81 MIL/uL   Hemoglobin 11.3 (L) 13.0 - 17.0 g/dL   HCT 34.0 (L) 39.0 - 52.0 %   MCV 99.1 80.0 - 100.0 fL   MCH 32.9 26.0 - 34.0 pg   MCHC 33.2 30.0 - 36.0 g/dL   RDW 13.4 11.5 - 15.5 %   Platelets 96 (L) 150 - 400 K/uL   nRBC 0.0 0.0 - 0.2 %  Basic metabolic panel     Status: Abnormal   Collection Time: 11/05/22  6:39 AM  Result Value Ref Range   Sodium 139 135 - 145 mmol/L   Potassium 4.1 3.5 - 5.1 mmol/L   Chloride 107 98 - 111 mmol/L   CO2 22 22 - 32 mmol/L   Glucose, Bld 114 (H) 70 - 99 mg/dL   BUN 38 (H) 8 - 23 mg/dL   Creatinine, Ser 4.17 (H) 0.61 - 1.24 mg/dL   Calcium 8.1 (L) 8.9 - 10.3 mg/dL   GFR, Estimated 14 (L) >60 mL/min   Anion gap 10 5 - 15  Magnesium     Status: Abnormal   Collection Time: 11/05/22  6:39 AM  Result Value Ref Range   Magnesium 1.3 (L) 1.7 - 2.4 mg/dL  Phosphorus     Status: None   Collection Time: 11/05/22  6:39 AM  Result Value Ref Range   Phosphorus 4.2 2.5 - 4.6 mg/dL  Lactic acid, plasma     Status: Abnormal   Collection Time: 11/05/22   6:39 AM  Result Value Ref Range   Lactic Acid, Venous 2.7 (HH) 0.5 - 1.9 mmol/L  Hepatic function panel     Status: Abnormal   Collection Time: 11/05/22  6:39 AM  Result Value Ref Range   Total Protein 5.5 (L) 6.5 - 8.1 g/dL   Albumin 2.7 (L) 3.5 - 5.0 g/dL   AST 48 (H) 15 - 41 U/L   ALT 16 0 - 44 U/L   Alkaline Phosphatase 37 (L) 38 - 126 U/L   Total Bilirubin 1.0 0.3 - 1.2 mg/dL   Bilirubin, Direct 0.2 0.0 - 0.2 mg/dL   Indirect Bilirubin 0.8 0.3 - 0.9 mg/dL    BMET Recent Labs    11/04/22 2047 11/05/22 0639  NA 140 139  K 3.7 4.1  CL 99 107  CO2 23 22  GLUCOSE 150* 114*  BUN 35* 38*  CREATININE 3.92* 4.17*  CALCIUM 9.4 8.1*   PT/INR No results for input(s): "LABPROT", "INR" in the last 72 hours. ABG No results for input(s): "PHART", "HCO3" in the last 72 hours.  Invalid input(s): "PCO2", "PO2"  Studies/Results: CT ABDOMEN PELVIS WO CONTRAST  Result Date: 11/04/2022 CLINICAL DATA:  Abdominal pain, postop EXAM: CT ABDOMEN AND PELVIS WITHOUT CONTRAST TECHNIQUE: Multidetector CT imaging of the abdomen and pelvis was performed following the standard protocol without IV contrast. RADIATION DOSE REDUCTION: This exam was performed according to the departmental dose-optimization program which includes automated exposure control, adjustment of the mA and/or kV according to patient size and/or use of iterative reconstruction technique. COMPARISON:  05/29/2022 FINDINGS: Lower chest: No acute abnormality Hepatobiliary: Diffuse low-density throughout the liver compatible with fatty infiltration. No focal abnormality. Gallbladder unremarkable. Pancreas: No focal abnormality or ductal dilatation. Spleen: No focal abnormality.  Normal size. Adrenals/Urinary Tract: Adrenal glands normal. Severe left hydronephrosis and perinephric stranding due to 4 mm left UVJ stone. Multiple nonobstructing stones in the left kidney. No stones or hydronephrosis on the right. Urinary bladder decompressed,  grossly unremarkable. Stomach/Bowel: Sigmoid diverticulosis. No active diverticulitis. Stomach and small bowel decompressed, unremarkable. Vascular/Lymphatic: Aortic atherosclerosis. No evidence of aneurysm or adenopathy. Reproductive: No visible focal abnormality.   Penile implant noted. Other: No free fluid or free air. Musculoskeletal: No acute bony abnormality. IMPRESSION: Severe left hydronephrosis and perinephric stranding due to 4 mm left UVJ stone. Left nephrolithiasis. Hepatic steatosis. Aortic atherosclerosis. Sigmoid diverticulosis. Electronically Signed   By: Rolm Baptise M.D.   On: 11/04/2022 21:57   DG Chest 2 View  Result Date: 11/04/2022 CLINICAL DATA:  Shortness of breath EXAM: CHEST - 2 VIEW COMPARISON:  05/29/2022 FINDINGS: Cardiac shadow is stable. Lungs are well aerated bilaterally. No focal infiltrate or effusion is seen. No bony abnormality is noted. IMPRESSION: No acute abnormality seen. Electronically Signed   By: Inez Catalina M.D.   On: 11/04/2022 21:12     Assessment/Plan: Left UVJ stone with obstruction and sepsis.   He has been decompressed with the nephrostomy tube and will need left ureteroscopy in the next couple of weeks when recovered from the episode of sepsis.  He could have the nephrostomy tube internalized to a JJ stent in a few days when his condition improves.   AKI with a functionally solitary kidney and right renal atrophy.  Metastatic castrate senstive prostate cancer now on ADT and Abiraterone following prior RP and Salvage RTx.         No follow-ups on file.    CC: Dr. Noemi Chapel.     Irine Seal 11/05/2022

## 2022-11-06 ENCOUNTER — Inpatient Hospital Stay (HOSPITAL_COMMUNITY): Payer: Medicare PPO

## 2022-11-06 DIAGNOSIS — E440 Moderate protein-calorie malnutrition: Secondary | ICD-10-CM | POA: Insufficient documentation

## 2022-11-06 DIAGNOSIS — A4153 Sepsis due to Serratia: Principal | ICD-10-CM

## 2022-11-06 LAB — CBC
HCT: 30.8 % — ABNORMAL LOW (ref 39.0–52.0)
Hemoglobin: 10.4 g/dL — ABNORMAL LOW (ref 13.0–17.0)
MCH: 33.1 pg (ref 26.0–34.0)
MCHC: 33.8 g/dL (ref 30.0–36.0)
MCV: 98.1 fL (ref 80.0–100.0)
Platelets: 65 10*3/uL — ABNORMAL LOW (ref 150–400)
RBC: 3.14 MIL/uL — ABNORMAL LOW (ref 4.22–5.81)
RDW: 13.2 % (ref 11.5–15.5)
WBC: 6.7 10*3/uL (ref 4.0–10.5)
nRBC: 0 % (ref 0.0–0.2)

## 2022-11-06 LAB — DIC (DISSEMINATED INTRAVASCULAR COAGULATION)PANEL
D-Dimer, Quant: 4.15 ug/mL-FEU — ABNORMAL HIGH (ref 0.00–0.50)
Fibrinogen: 793 mg/dL — ABNORMAL HIGH (ref 210–475)
INR: 1.3 — ABNORMAL HIGH (ref 0.8–1.2)
Platelets: 68 10*3/uL — ABNORMAL LOW (ref 150–400)
Prothrombin Time: 15.9 seconds — ABNORMAL HIGH (ref 11.4–15.2)
Smear Review: NONE SEEN
aPTT: 52 seconds — ABNORMAL HIGH (ref 24–36)

## 2022-11-06 LAB — BASIC METABOLIC PANEL
Anion gap: 9 (ref 5–15)
BUN: 53 mg/dL — ABNORMAL HIGH (ref 8–23)
CO2: 20 mmol/L — ABNORMAL LOW (ref 22–32)
Calcium: 8 mg/dL — ABNORMAL LOW (ref 8.9–10.3)
Chloride: 109 mmol/L (ref 98–111)
Creatinine, Ser: 4.38 mg/dL — ABNORMAL HIGH (ref 0.61–1.24)
GFR, Estimated: 13 mL/min — ABNORMAL LOW (ref >60)
Glucose, Bld: 136 mg/dL — ABNORMAL HIGH (ref 70–99)
Potassium: 3.3 mmol/L — ABNORMAL LOW (ref 3.5–5.1)
Sodium: 138 mmol/L (ref 135–145)

## 2022-11-06 LAB — HEMOGLOBIN AND HEMATOCRIT, BLOOD
HCT: 29.2 % — ABNORMAL LOW (ref 39.0–52.0)
Hemoglobin: 9.9 g/dL — ABNORMAL LOW (ref 13.0–17.0)

## 2022-11-06 LAB — MAGNESIUM: Magnesium: 1.7 mg/dL (ref 1.7–2.4)

## 2022-11-06 LAB — CREATININE, SERUM
Creatinine, Ser: 4.52 mg/dL — ABNORMAL HIGH (ref 0.61–1.24)
GFR, Estimated: 13 mL/min — ABNORMAL LOW (ref >60)

## 2022-11-06 MED ORDER — FENTANYL CITRATE PF 50 MCG/ML IJ SOSY: 25.0000 ug | PREFILLED_SYRINGE | INTRAMUSCULAR | Status: DC | PRN

## 2022-11-06 MED ORDER — TRAMADOL HCL 50 MG PO TABS
50.0000 mg | ORAL_TABLET | Freq: Two times a day (BID) | ORAL | Status: DC | PRN
  Administered 2022-11-06 – 2022-11-11 (×7): 50 mg via ORAL
  Filled 2022-11-06 (×7): qty 1

## 2022-11-06 MED ORDER — MAGNESIUM SULFATE 2 GM/50ML IV SOLN
2.0000 g | Freq: Once | INTRAVENOUS | Status: AC
  Administered 2022-11-06: 2 g via INTRAVENOUS
  Filled 2022-11-06: qty 50

## 2022-11-06 MED ORDER — "THROMBI-PAD 3""X3"" EX PADS"
1.0000 | MEDICATED_PAD | Freq: Once | CUTANEOUS | Status: AC
  Administered 2022-11-06: 1 via TOPICAL
  Filled 2022-11-06: qty 1

## 2022-11-06 MED ORDER — FENTANYL CITRATE PF 50 MCG/ML IJ SOSY
25.0000 ug | PREFILLED_SYRINGE | INTRAMUSCULAR | Status: AC | PRN
  Administered 2022-11-06 (×2): 25 ug via INTRAVENOUS
  Filled 2022-11-06 (×2): qty 1

## 2022-11-06 MED ORDER — ENSURE ENLIVE PO LIQD
237.0000 mL | Freq: Three times a day (TID) | ORAL | Status: DC
  Administered 2022-11-06 – 2022-11-11 (×14): 237 mL via ORAL

## 2022-11-06 MED ORDER — OXYBUTYNIN CHLORIDE 5 MG PO TABS
5.0000 mg | ORAL_TABLET | Freq: Three times a day (TID) | ORAL | Status: DC
  Administered 2022-11-06 – 2022-11-11 (×16): 5 mg via ORAL
  Filled 2022-11-06 (×16): qty 1

## 2022-11-06 MED ORDER — FENTANYL CITRATE PF 50 MCG/ML IJ SOSY
25.0000 ug | PREFILLED_SYRINGE | INTRAMUSCULAR | Status: DC | PRN
  Administered 2022-11-06 – 2022-11-07 (×4): 25 ug via INTRAVENOUS
  Filled 2022-11-06 (×4): qty 1

## 2022-11-06 MED ORDER — FENTANYL CITRATE PF 50 MCG/ML IJ SOSY
25.0000 ug | PREFILLED_SYRINGE | INTRAMUSCULAR | Status: AC | PRN
  Administered 2022-11-06 (×3): 25 ug via INTRAVENOUS
  Filled 2022-11-06 (×3): qty 1

## 2022-11-06 MED ORDER — LACTATED RINGERS IV BOLUS
500.0000 mL | Freq: Once | INTRAVENOUS | Status: AC
  Administered 2022-11-06: 500 mL via INTRAVENOUS

## 2022-11-06 NOTE — Progress Notes (Signed)
Sent message, via epic in basket, requesting orders in epic from surgeon.  

## 2022-11-06 NOTE — Progress Notes (Signed)
Initial Nutrition Assessment  DOCUMENTATION CODES:   Non-severe (moderate) malnutrition in context of chronic illness  INTERVENTION: - Continue Regular diet.  - Ensure Enlive po TID, each supplement provides 350 kcal and 20 grams of protein. - Encourage intake at all meals.  - Monitor weight trends.   NUTRITION DIAGNOSIS:   Moderate Malnutrition related to chronic illness (CKD, CAD, bladder cancer, prostate adenocarcinoma) as evidenced by moderate fat depletion, moderate muscle depletion.  GOAL:   Patient will meet greater than or equal to 90% of their needs  MONITOR:   PO intake, Supplement acceptance, Weight trends  REASON FOR ASSESSMENT:   Malnutrition Screening Tool    ASSESSMENT:   76 y.o. male with a history of CKD stage III, CAD, gout, bladder cancer, prostate adenocarcinoma, nephrolithiasis who presented with hypotension and tachycardia. He has been having pain from kidney stones since 12/10 with associated diarrhea. He came to the ED and was found to have left hydronephrosis and pyelonephritis with an obstructing UPJ stone.   Patient sleeoping at time of visit but wife present at bedside. She reports patient's UBW to be around 205# and denied recent weight loss. Notes he may have gained some weight recently since starting a new medication that they were told by doctors could result in weight gain. Per EMR, there has been no weight history since 2019 to assess recent changes.   Wife shares patient typically eats 2 meals a day. Food recall as below: Breakfast: often skips Lunch: a sandwich or leftovers from dinner the night before Dinner: meat with veggies and rice OR noodles  She notes patient typically has a fairly good appetite but has not been eating since admission. She reports he is usually very active and that patient has been sleepy since admission which is not normal for him. She feels he would be open to trying an Ensure. Will order Ensure TID to support  intake. Wife to encourage intake as tolerated. Also informed wife she or other family members can bring in food to increase patient's intake, just inform RN if doing so.   Medications reviewed and include: -  Labs reviewed:  K+ 3.3 Creatinine 4.38   NUTRITION - FOCUSED PHYSICAL EXAM:  Flowsheet Row Most Recent Value  Orbital Region Mild depletion  Upper Arm Region Moderate depletion  Thoracic and Lumbar Region Mild depletion  Buccal Region Moderate depletion  Temple Region Mild depletion  Clavicle Bone Region Mild depletion  Clavicle and Acromion Bone Region Mild depletion  Scapular Bone Region Unable to assess  Dorsal Hand Mild depletion  Patellar Region Moderate depletion  Anterior Thigh Region Moderate depletion  Posterior Calf Region Moderate depletion  Edema (RD Assessment) None  Hair Reviewed  Eyes Reviewed  Mouth Reviewed  Skin Reviewed  Nails Reviewed       Diet Order:   Diet Order             Diet regular Room service appropriate? Yes; Fluid consistency: Thin  Diet effective now                   EDUCATION NEEDS:  Education needs have been addressed  Skin:  Skin Assessment: Reviewed RN Assessment  Last BM:  12/12  Height:  Ht Readings from Last 1 Encounters:  11/05/22 '5\' 10"'$  (1.778 m)   Weight:  11/04/22 93 kg   Ideal Body Weight:  75.45 kg  BMI:  Body mass index is 28.6 kg/m.  Estimated Nutritional Needs:  Kcal:  6270-3500 kcals Protein:  95-110 grams Fluid:  >/= 2.3L or per MD    Samson Frederic RD, LDN For contact information, refer to [facility].

## 2022-11-06 NOTE — Progress Notes (Signed)
Referring Physician(s): Clark,L/Wrenn,J  Supervising Physician: Jacqulynn Cadet  Patient Status:  [facility] - In-pt  Chief Complaint: Urosepsis, left UVJ obstructing stone, hematuria   Subjective: Patient complaining of some left flank discomfort ; slightly more alert today; bloody output noted from left PCN and some blood noted on insertion site gauze dressing; platelets 65k today   Allergies: Patient has no known allergies.  Medications: Prior to Admission medications   Medication Sig Start Date End Date Taking? Authorizing Provider  abiraterone acetate (ZYTIGA) 250 MG tablet Take 1,000 mg by mouth daily. Take on an empty stomach   Yes [provider]  acetaminophen (TYLENOL) 500 MG tablet Take 1,000 mg by mouth daily as needed for headache or moderate pain.   Yes [provider]  allopurinol (ZYLOPRIM) 300 MG tablet Take 300 mg by mouth daily.   Yes [provider]  aspirin EC 81 MG tablet Take 81 mg by mouth daily.   Yes [provider]  atorvastatin (LIPITOR) 80 MG tablet Take 40 mg by mouth daily.   Yes [provider]  Cholecalciferol (VITAMIN D3) 25 MCG (1000 UT) CAPS Take 1,000 Units by mouth daily.   Yes [provider]  gabapentin (NEURONTIN) 300 MG capsule Take 600 mg by mouth daily.   Yes [provider]  metoprolol tartrate (LOPRESSOR) 25 MG tablet Take 50 mg by mouth every evening.   Yes [provider]  Multiple Vitamin (MULTIVITAMIN PO) Take 1 tablet by mouth daily.   Yes [provider]  nitroGLYCERIN (NITROSTAT) 0.4 MG SL tablet Place 1 tablet (0.4 mg total) under the tongue every 5 (five) minutes as needed for chest pain. 10/06/13  Yes Plotnikov, Evie Lacks, MD  Omega-3 Fatty Acids (FISH OIL) 1200 MG CAPS Take 1,200 mg by mouth daily.   Yes [provider]  OVER THE COUNTER MEDICATION Take 750 mg by mouth daily. Osteo Bi Flex Glucosamin   Yes [provider]  OVER  THE COUNTER MEDICATION Take 1 tablet by mouth daily. Calcium 1200 mg + D3 25 mcg (2000 units)   Yes [provider]  predniSONE (DELTASONE) 5 MG tablet Take 5 mg by mouth daily.   Yes [provider]  traMADol (ULTRAM) 50 MG tablet Take 25 mg by mouth daily as needed for moderate pain. 05/17/22  Yes [provider]  triamcinolone cream (KENALOG) 0.1 % Apply 1 Application topically daily.   Yes [provider]  vitamin C (ASCORBIC ACID) 500 MG tablet Take 500 mg by mouth daily.   Yes [provider]  Zinc 50 MG TABS Take 50 mg by mouth daily in the afternoon.   Yes [provider]  COVID-19 mRNA bivalent vaccine, Pfizer, (PFIZER COVID-19 VAC BIVALENT) injection Inject into the muscle. 09/12/21   Carlyle Basques, MD  ondansetron (ZOFRAN-ODT) 4 MG disintegrating tablet Take 1 tablet (4 mg total) by mouth every 8 (eight) hours as needed for nausea or vomiting. Patient not taking: Reported on 11/05/2022 05/29/22   Davonna Belling, MD     Vital Signs: BP (!) 175/92   Pulse 99   Temp 97.7 F (36.5 C) (Oral)   Resp (!) 22   Ht '5\' 10"'$  (1.778 m)   Wt 211 lb 13.8 oz (96.1 kg)   SpO2 96%   BMI 30.40 kg/m   Physical Exam patient awake, but still somewhat confused; left PCN intact, blood noted on gauze surrounding insertion site; new bandage placed; recorded output approximately 1 L over  the last 12 hours; bloody output in PCN bag; PCN flushed slowly without resistance  Imaging: IR NEPHROSTOMY PLACEMENT LEFT  Result Date: 11/05/2022 INDICATION: 76 year old with urosepsis secondary to an obstructing stone at the left ureterovesical junction. Right kidney is atrophic. EXAM: LEFT PERCUTANEOUS NEPHROSTOMY TUBE PLACEMENT WITH ULTRASOUND AND FLUOROSCOPIC GUIDANCE COMPARISON:  CT 11/04/2022 MEDICATIONS: Antibiotics were given prior to the procedure by the primary care team. ANESTHESIA/SEDATION: Moderate (conscious) sedation was employed during this  procedure. A total of Versed 1.0 mg and Fentanyl 25 mcg was administered intravenously by the radiology nurse. Total intra-service moderate Sedation Time: 19 minutes. The patient's level of consciousness and vital signs were monitored continuously by radiology nursing throughout the procedure under my direct supervision. CONTRAST:  10 mL Omnipaque 300-administered into the collecting system(s) FLUOROSCOPY: Radiation Exposure Index (as provided by the fluoroscopic device): 28 mGy Kerma COMPLICATIONS: None immediate. PROCEDURE: Informed written consent was obtained from the patient's wife after a thorough discussion of the procedural risks, benefits and alternatives. All questions were addressed. Maximal Sterile Barrier Technique was utilized including caps, mask, sterile gowns, sterile gloves, sterile drape, hand hygiene and skin antiseptic. A timeout was performed prior to the initiation of the procedure. Patient was placed prone on the interventional table with the left side slightly rolled up. Left flank was prepped and draped in sterile fashion. Ultrasound was used to identify the left kidney from an intercostal approach. Dilated mid/upper pole calyx was targeted. Skin was anesthetized with 1% lidocaine and a small incision was made. Using ultrasound guidance, a 21 gauge needle was directed into the dilated collecting system and contrast was injected to confirm placement in the collecting system. 0.018 wire was advanced into the collecting system and Accustick dilator set was placed. Tract was dilated over a J wire and a 10.2 [place] multipurpose drain was advanced over the wire and reconstituted in the renal pelvis. Cloudy purulent-looking yellow fluid was removed from the renal collecting system. Small amount of contrast was injected to confirm placement in the renal pelvis. Nephrostomy tube was attached to a gravity bag and sutured to skin. Fluoroscopic and ultrasound images were taken and saved for  documentation. FINDINGS: Moderate left hydronephrosis. It was difficult to visualize the kidney due to overlying ribs. Dilated calyx in the mid/upper pole region was accessed. Left renal collecting system was decompressed at the end of the procedure. IMPRESSION: Successful placement of left percutaneous nephrostomy tube using ultrasound and fluoroscopic guidance. Electronically Signed   By: Markus Daft M.D.   On: 11/05/2022 09:21   CT ABDOMEN PELVIS WO CONTRAST  Result Date: 11/04/2022 CLINICAL DATA:  Abdominal pain, postop EXAM: CT ABDOMEN AND PELVIS WITHOUT CONTRAST TECHNIQUE: Multidetector CT imaging of the abdomen and pelvis was performed following the standard protocol without IV contrast. RADIATION DOSE REDUCTION: This exam was performed according to the departmental dose-optimization program which includes automated exposure control, adjustment of the mA and/or kV according to patient size and/or use of iterative reconstruction technique. COMPARISON:  05/29/2022 FINDINGS: Lower chest: No acute abnormality Hepatobiliary: Diffuse low-density throughout the liver compatible with fatty infiltration. No focal abnormality. Gallbladder unremarkable. Pancreas: No focal abnormality or ductal dilatation. Spleen: No focal abnormality.  Normal size. Adrenals/Urinary Tract: Adrenal glands normal. Severe left hydronephrosis and perinephric stranding due to 4 mm left UVJ stone. Multiple nonobstructing stones in the left kidney. No stones or hydronephrosis on the right. Urinary bladder decompressed, grossly unremarkable. Stomach/Bowel: Sigmoid diverticulosis. No active diverticulitis. Stomach and small bowel decompressed, unremarkable. Vascular/Lymphatic: Aortic atherosclerosis.  No evidence of aneurysm or adenopathy. Reproductive: No visible focal abnormality.   Penile implant noted. Other: No free fluid or free air. Musculoskeletal: No acute bony abnormality. IMPRESSION: Severe left hydronephrosis and perinephric  stranding due to 4 mm left UVJ stone. Left nephrolithiasis. Hepatic steatosis. Aortic atherosclerosis. Sigmoid diverticulosis. Electronically Signed   By: Rolm Baptise M.D.   On: 11/04/2022 21:57   DG Chest 2 View  Result Date: 11/04/2022 CLINICAL DATA:  Shortness of breath EXAM: CHEST - 2 VIEW COMPARISON:  05/29/2022 FINDINGS: Cardiac shadow is stable. Lungs are well aerated bilaterally. No focal infiltrate or effusion is seen. No bony abnormality is noted. IMPRESSION: No acute abnormality seen. Electronically Signed   By: Inez Catalina M.D.   On: 11/04/2022 21:12    Labs:  CBC: Recent Labs    05/29/22 0923 11/04/22 2047 11/05/22 0639 11/06/22 0715  WBC 7.8 21.1* 11.8* 6.7  HGB 17.4* 14.0 11.3* 10.4*  HCT 52.5* 41.9 34.0* 30.8*  PLT 141* 129* 96* 65*    COAGS: No results for input(s): "INR", "APTT" in the last 8760 hours.  BMP: Recent Labs    05/29/22 0923 11/04/22 2047 11/05/22 0639 11/06/22 0249 11/06/22 0715  NA 140 140 139  --  138  K 3.7 3.7 4.1  --  3.3*  CL 103 99 107  --  109  CO2 '27 23 22  '$ --  20*  GLUCOSE 130* 150* 114*  --  136*  BUN 18 35* 38*  --  53*  CALCIUM 10.1 9.4 8.1*  --  8.0*  CREATININE 1.30* 3.92* 4.17* 4.52* 4.38*  GFRNONAA 57* 15* 14* 13* 13*    LIVER FUNCTION TESTS: Recent Labs    05/29/22 0937 11/04/22 2047 11/05/22 0639  BILITOT 1.1 1.3* 1.0  AST 52* 40 48*  ALT '23 14 16  '$ ALKPHOS 67 49 37*  PROT 8.2* 6.9 5.5*  ALBUMIN 4.4 4.1 2.7*    Assessment and Plan: Patient with history of left UVJ stone with obstruction/hydronephrosis, sepsis, hematuria, acute on chronic kidney disease, coronary artery disease, gout, bladder cancer, prostate cancer, functionally solitary left kidney with right renal atrophy; status post left nephrostomy on 11/05/22; afebrile, WBC normal, hemoglobin 10.4 (11.3), platelets 65 K, creatinine 4.38 down from 4.52 urine ,urine culture pending; as per urology patient will need left ureteroscopy once covered from  sepsis and potential internalization to double-J stent when clinical condition improves; above findings d/w Dr. Laurence Ferrari- will obtain noncontrast CT abd to further evaluate PCN location   Electronically Signed: D. Rowe Robert, PA-C 11/06/2022, 11:32 AM   I spent a total of 15 Minutes at the the patient's bedside AND on the patient's hospital floor or unit, greater than 50% of which was counseling/coordinating care for left nephrostomy    Patient ID: [name], male   DOB: [birth year], 76 y.o.   MRN: [id number]

## 2022-11-06 NOTE — Progress Notes (Signed)
I called Cameron at [facility] [employer], patient's BP trending up and moaning in pain. Okay to give tramadol early and continue to monitor BP and alert Lysbeth Galas if BP continues to be elevated.

## 2022-11-06 NOTE — Progress Notes (Addendum)
[facility] Progress Note Patient Name: [name] DOB: 03-23-1946 MRN: [id number]   Date of Service  11/06/2022  HPI/Events of Note  Pain - Not alert enough for PO meds.   eICU Interventions  Plan: Fentanyl 25 mcg IV Q 2 hours PRN pain X 3 doses.      Intervention Category Major Interventions: Other:  Nhia Heaphy Cornelia Copa 11/06/2022, 12:21 AM

## 2022-11-06 NOTE — Progress Notes (Signed)
NAME:  [name], MRN:  [id number], DOB:  1945/12/24, LOS: 1 ADMISSION DATE:  11/04/2022, CONSULTATION DATE:  11/04/22 REFERRING MD:  Serita Kyle, CHIEF COMPLAINT:  sepsis, obstructing urinary stone   History of Present Illness:  [name] is a 76 year old gentleman with a history of CKD stage III, coronary artery disease, gout, bladder cancer, prostate adenocarcinoma, nephrolithiasis who presented from urgent care with hypotension and tachycardia.  He has been having pain from kidney stones since 12/10 with associated diarrhea.  He came to the ED and was found to have left hydronephrosis and pyelonephritis with an obstructing UPJ stone.  He was transferred from [place] ED to [facility] for evaluation.  Due to his extensive urologic history urology has recommended nephrostomy tube placement.  He follows with urologist Dr. Standley Dakins at the [facility]. He denies other new symptoms. Per chart review, he admitted to a weakly positive covid test in the ED triage area.  He has had several episodes of diarrhea since presenting to the ED.   Pertinent  Medical History  Prostate adenocarcinoma Bladder cancer CAD Kidney stones CKD 3 Gout Hypertension Hyperlipidemia  Significant Hospital Events: Including procedures, antibiotic start and stop dates in addition to other pertinent events   12/11 presented to the ED, transferred to [facility] overnight 12/12 off pressors  Interim History / Subjective:  Awake, more interactive today Mental status still not at baseline Somnolent, some confusion  Objective   Blood pressure (!) 175/89, pulse 93, temperature 97.7 F (36.5 C), temperature source Oral, resp. rate (!) 22, height '5\' 10"'$  (1.778 m), weight 96.1 kg, SpO2 97 %.        Intake/Output Summary (Last 24 hours) at 11/06/2022 0829 Last data filed at 11/06/2022 0600 Gross per 24 hour  Intake 969.45 ml  Output 2075 ml  Net -1105.55 ml    Filed Weights   11/05/22 0058 11/05/22 0435  11/06/22 0500  Weight: 94.8 kg 94.8 kg 96.1 kg   Examination: General: Chronically ill-appearing HENT: Moist oral mucosa Lungs: Remains on room air, clear to auscultation Cardiovascular: S1-S2 appreciated Abdomen: Soft, bowel sounds appreciated Extremities: No edema, no cyanosis  Neuro: Awake, alert, moving all extremities Derm: Jaundiced, no diffuse rashes  BUN 53, creatinine 4.38 -Creatinine peaked at 4.52  H&H pending  CT abd pelvis personally reviewed: Left obstructing UVJ stone with severe hydronephrosis and perinephric stranding.    Resolved Hospital Problem list     Assessment & Plan:   Septic shock due to urinary tract infection Left pyelonephritis with obstructing UPJ stone Lactic acidosis-improved  Urine culture with gram-negative rods Blood culture with Serratia -Continue cefepime -Follow cultures  Hydronephrosis Obstructive pyelonephritis -Status post nephrostomy tube placement -I appreciate urology involvement -Plan will be to internalize to Adair stent once more stable  Acute kidney injury on chronic kidney disease -Continue IV fluids -Avoid nephrotoxic agents -Maintain renal perfusion -Continue to monitor renal functions -500 cc bolus of LR -Increase LR rate to 100 cc an hour  Hyperglycemia -Continue sliding scale insulin  Metabolic acidosis secondary to sepsis -Continue to support  Patient remains DNR  I did discuss with spouse at bedside this morning  Continue home meds including Zytiga   Best Practice (right click and "Reselect all SmartList Selections" daily)   Diet/type: Regular consistency (see orders) DVT prophylaxis: prophylactic heparin  GI prophylaxis: N/A Lines: N/A Foley:  N/A Code Status:  DNR Last date of multidisciplinary goals of care discussion '[ ]'$   Labs   CBC: Recent Labs  Lab 11/04/22 2047 11/05/22 0639  WBC 21.1* 11.8*  NEUTROABS 19.0*  --   HGB 14.0 11.3*  HCT 41.9 34.0*  MCV 98.6 99.1  PLT 129* 96*      Basic Metabolic Panel: Recent Labs  Lab 11/04/22 2047 11/05/22 0639 11/06/22 0249 11/06/22 0715  NA 140 139  --  138  K 3.7 4.1  --  3.3*  CL 99 107  --  109  CO2 23 22  --  20*  GLUCOSE 150* 114*  --  136*  BUN 35* 38*  --  53*  CREATININE 3.92* 4.17* 4.52* 4.38*  CALCIUM 9.4 8.1*  --  8.0*  MG  --  1.3*  --  1.7  PHOS  --  4.2  --   --     GFR: Estimated Creatinine Clearance: 16.7 mL/min (A) (by C-G formula based on SCr of 4.38 mg/dL (H)). Recent Labs  Lab 11/04/22 2047 11/04/22 2250 11/05/22 0639  WBC 21.1*  --  11.8*  LATICACIDVEN 7.9* 5.8* 2.7*     Liver Function Tests: Recent Labs  Lab 11/04/22 2047 11/05/22 0639  AST 40 48*  ALT 14 16  ALKPHOS 49 37*  BILITOT 1.3* 1.0  PROT 6.9 5.5*  ALBUMIN 4.1 2.7*    Recent Labs  Lab 11/04/22 2047  LIPASE 44    No results for input(s): "AMMONIA" in the last 168 hours.  ABG    Component Value Date/Time   TCO2 25 12/30/2017 0649     Coagulation Profile: No results for input(s): "INR", "PROTIME" in the last 168 hours.  Cardiac Enzymes: No results for input(s): "CKTOTAL", "CKMB", "CKMBINDEX", "TROPONINI" in the last 168 hours.  HbA1C: Hgb A1c MFr Bld  Date/Time Value Ref Range Status  08/09/2011 11:29 AM 5.8 4.6 - 6.5 % Final    Comment:    Glycemic Control Guidelines for People with Diabetes:Non Diabetic:  <6%Goal of Therapy: <7%Additional Action Suggested:  >8%   01/19/2010 10:29 PM  4.6 - 6.1 % Final   5.6 (NOTE) The ADA recommends the following therapeutic goal for glycemic control related to Hgb A1c measurement: Goal of therapy: <6.5 Hgb A1c  Reference: American Diabetes Association: Clinical Practice Recommendations 2010, Diabetes Care, 2010, 33: (Suppl  1).    CBG: Recent Labs  Lab 11/05/22 0137 11/05/22 1200  GLUCAP 127* 104*     Review of Systems:   Review of Systems  Constitutional:  Positive for chills, fever and malaise/fatigue.  Respiratory:  Negative for sputum  production.   Cardiovascular: Negative.   Gastrointestinal:  Positive for abdominal pain and diarrhea.  Genitourinary:  Positive for dysuria.     Past Medical History:  He,  has a past medical history of Atrophy of right kidney, CKD (chronic kidney disease), stage III ([place]), CORONARY ARTERY DISEASE (CARDIOLOGIST--  DR WALL AND DR Angelena Form----   [facility] [place]), Cystitis, radiation, DDD (degenerative disc disease), lumbar, Diverticulosis of colon, GOUT (06/14/2008), H/O agent Orange exposure, History of bladder cancer (2016), History of kidney stones, History of myocardial infarction, History of recurrent UTIs, Hyperlipidemia, Hypertension, Nephrolithiasis, Nocturia, Normal exercise tolerance test results, OA (osteoarthritis), Organic impotence, PONV (postoperative nausea and vomiting), Recurrent prostate adenocarcinoma [facility]) (urologist--  dr Irine Seal---- per pt last PSA 0.12 as of 12-02-2017), Rosacea, S/P coronary artery stent placement, S/P radiation therapy (10/02/11 - 11/25/11), Spinal stenosis, SUI (stress urinary incontinence), male, and Wears glasses.   Surgical History:   Past Surgical History:  Procedure Laterality Date  ANTERIOR CERVICAL DECOMP/DISCECTOMY FUSION  2001   w/ removal bone spur--  1 level   BONE CYST EXCISION  2012   LUMBAR   CARDIOVASCULAR STRESS TEST  04-23-2011  dr Angelena Form   Low risk nuclear study/ fixed mixed basal inferior and inferolateral perfusion defect with wall motion abnormality that suggest prior MI, no significant ishemia/  ef 54%   COLONOSCOPY  03-04-2007   CORONARY ANGIOPLASTY  01-22-2010  dr Darnell Level brodie   successfully cutting angioplasty to in-stent restenosis  mCFX   CORONARY ANGIOPLASTY WITH STENT PLACEMENT  04-29-2003  dr Lyndel Safe   BMS x1 and DES x1 to mid left CFX for total occlusion 99%/  nonobstructive dLAD 60% and pLAD30%,  pRCA 30%,  lateral and mid inferior akinesis,  moderate irregularities LM,  D2 and D3  40%,  ef 50%   CYSTO W/ PLACEMENT RIGHT URETERAL STENT  07-19-2004   CYSTOSCOPY N/A 01/03/2015   Procedure: CYSTOSCOPY WITH FULGURATION OF BLEEDERS;  Surgeon: Malka So, MD;  Location: [facility];  Service: Urology;  Laterality: N/A;   CYSTOSCOPY W/ URETERAL STENT PLACEMENT Left 01/05/2018   Procedure: CYSTOSCOPY WITH LEFT RETROGRADE PYELOGRAM/ LEFT URETEROSCOPY/STONE BASKET REMOVAL/ LEFT URETERAL STENT PLACEMENT;  Surgeon: Ceasar Mons, MD;  Location: [facility];  Service: Urology;  Laterality: Left;   CYSTOSCOPY WITH BIOPSY N/A 12/01/2014   Procedure: CYSTOSCOPY WITH BIOPSY/FULGURATION;  Surgeon: Malka So, MD;  Location[facility];  Service: Urology;  Laterality: N/A;   CYSTOSCOPY WITH RETROGRADE PYELOGRAM, URETEROSCOPY AND STENT PLACEMENT Right 05/16/2016   Procedure: CYSTOSCOPY WITH RIGHT RETROGRADE PYELOGRAM, URETEROSCOPY WITH LASER AND STENT PLACEMENT;  Surgeon: Irine Seal, MD;  Location: [facility];  Service: Urology;  Laterality: Right;   CYSTOSCOPY WITH STENT PLACEMENT Left 12/04/2017   Procedure: CYSTOSCOPY WITH STENT PLACEMENT;  Surgeon: Irine Seal, MD;  Location: [facility];  Service: Urology;  Laterality: Left;   CYSTOSCOPY/URETEROSCOPY/HOLMIUM LASER/STENT PLACEMENT Left 12/30/2017   Procedure: CYSTOSCOPY LEFT URETEROSCOPY WITH HOLMIUM LASER AND  LEFT STENT PLACEMENT;  Surgeon: Irine Seal, MD;  Location: [facility];  Service: Urology;  Laterality: Left;   EXTRACORPOREAL SHOCK WAVE LITHOTRIPSY Left 09-16-2013   EXTRACORPOREAL SHOCK WAVE LITHOTRIPSY Left 12/08/2017   Procedure: LEFT EXTRACORPOREAL SHOCK WAVE LITHOTRIPSY (ESWL);  Surgeon: Franchot Gallo, MD;  Location: [facility];  Service: Urology;  Laterality: Left;   HOLMIUM LASER APPLICATION Right 8/78/6767   Procedure: HOLMIUM LASER APPLICATION;  Surgeon: Irine Seal, MD;  Location: [facility];  Service: Urology;  Laterality:  Right;   INGUINAL HERNIA REPAIR Left 11-03-2003   IR NEPHROSTOMY PLACEMENT LEFT  11/05/2022   LUMBAR Bartholomew   LUMBAR LAMINECTOMY  04-25-2011   LUMBAR LAMINECTOMY/DECOMPRESSION MICRODISCECTOMY Right 11/29/2015   Procedure:  DECOMPRESSION LUMBAR LAMINECTOMY L4-L5 MICRODISCECTOMY L4-L5 LEFT    EXCISION SENOVAL CYST LUMBAR 4-5 RIGHT  PARICAL FACETECTOMY LUMBAR 4-5 RIGHT ;  Surgeon: Latanya Maudlin, MD;  Location: [facility];  Service: Orthopedics;  Laterality: Right;   PENILE PROSTHESIS IMPLANT N/A 02/16/2013   Procedure: REPLACEMENT OF AMS PENILE PROTHESIS INFLATABLE;  Surgeon: Malka So, MD;  Location: [facility];  Service: Urology;  Laterality: N/A;   PENILE PROSTHESIS PLACEMENT  2003   RADICAL PROSTATECTOMY W/ BILATERAL PELVIC LYMPHADENECTOMY  08-07-2000   RIGHT URETEROSCOPIC LASER LITHOTRIPSY STONE EXTRACTION AND STENT  07-26-2004   TRANSTHORACIC ECHOCARDIOGRAM  01-21-2010   mild LVH/  ef 60-65%/  trivial TR     Social History:  reports that he quit smoking about 19 years ago. His smoking use included cigarettes. He has a 60.00 pack-year smoking history. He has never used smokeless tobacco. He reports current alcohol use of about 21.0 standard drinks of alcohol per week. He reports that he does not use drugs.   Family History:  His family history includes Heart attack in his father; Hypertension in an other family member.   Allergies No Known Allergies   The patient is critically ill with multiple organ systems failure and requires high complexity decision making for assessment and support, frequent evaluation and titration of therapies, application of advanced monitoring technologies and extensive interpretation of multiple databases. Critical Care Time devoted to patient care services described in this note independent of APP/resident time (if applicable)  is 32 minutes.   Sherrilyn Rist MD [facility] Personal pager: See Amion If unanswered, please page CCM  On-call: [phone]

## 2022-11-07 DIAGNOSIS — A498 Other bacterial infections of unspecified site: Secondary | ICD-10-CM

## 2022-11-07 DIAGNOSIS — N111 Chronic obstructive pyelonephritis: Secondary | ICD-10-CM

## 2022-11-07 LAB — URINE CULTURE: Culture: 80000 — AB

## 2022-11-07 LAB — CULTURE, BLOOD (ROUTINE X 2): Special Requests: ADEQUATE

## 2022-11-07 LAB — BASIC METABOLIC PANEL
Anion gap: 11 (ref 5–15)
BUN: 48 mg/dL — ABNORMAL HIGH (ref 8–23)
CO2: 20 mmol/L — ABNORMAL LOW (ref 22–32)
Calcium: 8.6 mg/dL — ABNORMAL LOW (ref 8.9–10.3)
Chloride: 106 mmol/L (ref 98–111)
Creatinine, Ser: 3.74 mg/dL — ABNORMAL HIGH (ref 0.61–1.24)
GFR, Estimated: 16 mL/min — ABNORMAL LOW (ref >60)
Glucose, Bld: 114 mg/dL — ABNORMAL HIGH (ref 70–99)
Potassium: 3.2 mmol/L — ABNORMAL LOW (ref 3.5–5.1)
Sodium: 137 mmol/L (ref 135–145)

## 2022-11-07 LAB — CBC
HCT: 32.5 % — ABNORMAL LOW (ref 39.0–52.0)
Hemoglobin: 10.8 g/dL — ABNORMAL LOW (ref 13.0–17.0)
MCH: 32.7 pg (ref 26.0–34.0)
MCHC: 33.2 g/dL (ref 30.0–36.0)
MCV: 98.5 fL (ref 80.0–100.0)
Platelets: 71 10*3/uL — ABNORMAL LOW (ref 150–400)
RBC: 3.3 MIL/uL — ABNORMAL LOW (ref 4.22–5.81)
RDW: 13.1 % (ref 11.5–15.5)
WBC: 5.5 10*3/uL (ref 4.0–10.5)
nRBC: 0 % (ref 0.0–0.2)

## 2022-11-07 LAB — MAGNESIUM: Magnesium: 2.1 mg/dL (ref 1.7–2.4)

## 2022-11-07 MED ORDER — DOCUSATE SODIUM 100 MG PO CAPS
100.0000 mg | ORAL_CAPSULE | Freq: Two times a day (BID) | ORAL | Status: DC
  Administered 2022-11-07 – 2022-11-11 (×8): 100 mg via ORAL
  Filled 2022-11-07 (×9): qty 1

## 2022-11-07 MED ORDER — CYCLOBENZAPRINE HCL 5 MG PO TABS
5.0000 mg | ORAL_TABLET | Freq: Three times a day (TID) | ORAL | Status: DC | PRN
  Administered 2022-11-07 – 2022-11-10 (×5): 5 mg via ORAL
  Filled 2022-11-07 (×5): qty 1

## 2022-11-07 MED ORDER — HYDRALAZINE HCL 20 MG/ML IJ SOLN
10.0000 mg | INTRAMUSCULAR | Status: DC | PRN
  Administered 2022-11-07 – 2022-11-09 (×3): 10 mg via INTRAVENOUS
  Filled 2022-11-07 (×4): qty 1

## 2022-11-07 MED ORDER — FENTANYL CITRATE PF 50 MCG/ML IJ SOSY
50.0000 ug | PREFILLED_SYRINGE | INTRAMUSCULAR | Status: DC | PRN
  Administered 2022-11-07: 50 ug via INTRAVENOUS
  Filled 2022-11-07: qty 1

## 2022-11-07 MED ORDER — POLYETHYLENE GLYCOL 3350 17 G PO PACK
17.0000 g | PACK | Freq: Two times a day (BID) | ORAL | Status: DC
  Administered 2022-11-07 – 2022-11-11 (×7): 17 g via ORAL
  Filled 2022-11-07 (×8): qty 1

## 2022-11-07 MED ORDER — LIDOCAINE 5 % EX PTCH
1.0000 | MEDICATED_PATCH | CUTANEOUS | Status: DC
  Administered 2022-11-07 – 2022-11-11 (×5): 1 via TRANSDERMAL
  Filled 2022-11-07 (×5): qty 1

## 2022-11-07 MED ORDER — GABAPENTIN 300 MG PO CAPS
300.0000 mg | ORAL_CAPSULE | Freq: Two times a day (BID) | ORAL | Status: DC
  Administered 2022-11-07 – 2022-11-08 (×3): 300 mg via ORAL
  Filled 2022-11-07 (×5): qty 1

## 2022-11-07 MED ORDER — POTASSIUM CHLORIDE CRYS ER 20 MEQ PO TBCR
40.0000 meq | EXTENDED_RELEASE_TABLET | Freq: Once | ORAL | Status: AC
  Administered 2022-11-07: 40 meq via ORAL
  Filled 2022-11-07: qty 2

## 2022-11-07 MED ORDER — FENTANYL CITRATE PF 50 MCG/ML IJ SOSY: 50.0000 ug | PREFILLED_SYRINGE | INTRAMUSCULAR | Status: DC | PRN

## 2022-11-07 NOTE — Consult Note (Signed)
[facility]  Total days of antibiotics 4/cefepime   Reason for Consult:serratia bacteremia from urinary source   Referring Physician: olalere  Principal Problem:   Sepsis ([place]) Active Problems:   AKI (acute kidney injury) ([place])   Malnutrition of moderate degree    HPI: [name] is a 76 y.o. male with ckd3, hx of atrophic right kidney, CAD, hx of bladder ca, prostate ca, and hx of nephrolithiasis was admitted for abdominal pain from diarrhea but upon evaluation at ED, he was found to have left hydronephrosis with pyelonephritis with an obstructive UPJ stone. His labs showed leukocytosis, lactic acidosis of 7.9, , He was started on empiric abtx and admitted for management of sepsis and need for nephrostomy.his infectious work up showed that he had serratia bacteremia and +urine culture.he had emergent left nephrostomy tube placed and was seen by dr Jeffie Pollock - who states eventually needs to internal drain after recovering from sepsis. His mentation is much improved on day 4 of iv abtx, starting to eat. Less abdominal pain. Still blood in urinary collection system.  Past Medical History:  Diagnosis Date   Atrophy of right kidney    CKD (chronic kidney disease), stage III ([place])    CORONARY ARTERY DISEASE CARDIOLOGIST--  DR WALL AND DR Angelena Form----   currently under [facility] in [place]   ETT  05-06-2014,  no ischemia or st changes;  low risk nuclear study 04-23-2011--- a. s/p post wall MI  2004 => overlapping stents in the CFX (one Zeta stent and one Taxus DES);  b. [place] => LHC 2/11: Normal LM, normal LAD, normal Dx, CFX 80% ISR, PDA 30-40%, EF 55%. PCI: Cutting balloon angioplasty to the CFX ISR;  c.  Lex MV 5/12: inf and IL defect c/w prior MI, no ischemia, EF 54%   Cystitis, radiation    2013 following salvage therapy for recurrent prostate cancer post prostatectomy    DDD (degenerative disc disease), lumbar    Diverticulosis of colon     GOUT 06/14/2008   H/O agent Orange exposure    History of bladder cancer 2016   urologsit-  dr Jeffie Pollock   History of kidney stones    History of myocardial infarction    04-29-2003  -- posterior and lateral wall MI  s/p  stent x2 to  left mid CFX   History of recurrent UTIs    chronic   Hyperlipidemia    Hypertension    Nephrolithiasis    bilateral   Nocturia    Normal exercise tolerance test results    05-06-2014  Negative for ischemia GXT with the pt exercising to a 10 met work load and 89% APMHR without new diagnotic changes of ischemia   OA (osteoarthritis)    neck and shoulders   Organic impotence    arterial insuff.   PONV (postoperative nausea and vomiting)    Recurrent prostate adenocarcinoma [facility]) urologist--  dr Irine Seal---- per pt last PSA 0.12 as of 12-02-2017   first dx  2001,  T2b  N0  M0,  Gleason 6--  s/p  radical prostatectomy/   recurrent 11/ 2012  s/p salvage radiation    Rosacea    S/P coronary artery stent placement    BMS x1  and DES x1  to mid left CFX  04-29-2003   S/P radiation therapy 10/02/11 - 11/25/11   Central Lower Pelvis: 6840 cGy/38 Fractions--  for recurrent prostate cancer   Spinal stenosis  SUI (stress urinary incontinence), male    Wears glasses     Allergies: No Known Allergies   MEDICATIONS:  abiraterone acetate  1,000 mg Oral Daily   Chlorhexidine Gluconate Cloth  6 each Topical Daily   docusate sodium  100 mg Oral BID   feeding supplement  237 mL Oral TID BM   gabapentin  300 mg Oral BID   lidocaine  1 patch Transdermal Q24H   oxybutynin  5 mg Oral TID   polyethylene glycol  17 g Oral BID   predniSONE  5 mg Oral Q breakfast   sodium chloride flush  5 mL Intracatheter Q8H    Social History   Tobacco Use   Smoking status: Former    Packs/day: 1.50    Years: 40.00    Total pack years: 60.00    Types: Cigarettes    Quit date: 11/28/2002    Years since quitting: 19.9   Smokeless tobacco: Never  Vaping Use   Vaping Use:  Never used  Substance Use Topics   Alcohol use: Yes    Alcohol/week: 21.0 standard drinks of alcohol    Types: 21 Shots of liquor per week    Comment: 3 oz  per daily   Drug use: No    Family History  Problem Relation Age of Onset   Hypertension Other    Heart attack Father     Review of Systems -  12 point ros is negative except what is mentioned in hpi  OBJECTIVE: Temp:  [97.6 F (36.4 C)-98.5 F (36.9 C)] 98.4 F (36.9 C) (12/14 1100) Pulse Rate:  [82-95] 84 (12/14 1300) Resp:  [13-30] 20 (12/14 1300) BP: (129-182)/(66-103) 152/92 (12/14 1300) SpO2:  [89 %-96 %] 89 % (12/14 1300) Weight:  [96.1 kg] 96.1 kg (12/14 0500) Physical Exam  Constitutional: He is oriented to person, place, and time. He appears well-developed and well-nourished. No distress.  HENT:  Mouth/Throat: Oropharynx is clear and moist. No oropharyngeal exudate.  Cardiovascular: Normal rate, regular rhythm and normal heart sounds. Exam reveals no gallop and no friction rub.  No murmur heard.  Pulmonary/Chest: Effort normal and breath sounds normal. No respiratory distress. He has no wheezes.  Abdominal: Soft. Bowel sounds are normal. He exhibits no distension. There is no tenderness.  Lymphadenopathy:  He has no cervical adenopathy.  Back =left nephrostomy tube in place Neurological: He is alert and oriented to person, place, and time.  Skin: Skin is warm and dry. No rash noted. No erythema.  Psychiatric: He has a normal mood and affect. His behavior is normal.    LABS: Results for orders placed or performed during the hospital encounter of 11/04/22 (from the past 48 hour(s))  Creatinine, serum     Status: Abnormal   Collection Time: 11/06/22  2:49 AM  Result Value Ref Range   Creatinine, Ser 4.52 (H) 0.61 - 1.24 mg/dL   GFR, Estimated 13 (L) >60 mL/min    Comment: (NOTE) Calculated using the CKD-EPI Creatinine Equation (2021) Performed at [facility] [place] [address], [place], [place] [address]   Basic metabolic panel     Status: Abnormal   Collection Time: 11/06/22  7:15 AM  Result Value Ref Range   Sodium 138 135 - 145 mmol/L   Potassium 3.3 (L) 3.5 - 5.1 mmol/L   Chloride 109 98 - 111 mmol/L   CO2 20 (L) 22 - 32 mmol/L   Glucose, Bld 136 (H) 70 - 99 mg/dL  Comment: Glucose reference range applies only to samples taken after fasting for at least 8 hours.   BUN 53 (H) 8 - 23 mg/dL   Creatinine, Ser 4.38 (H) 0.61 - 1.24 mg/dL   Calcium 8.0 (L) 8.9 - 10.3 mg/dL   GFR, Estimated 13 (L) >60 mL/min    Comment: (NOTE) Calculated using the CKD-EPI Creatinine Equation (2021)    Anion gap 9 5 - 15    Comment: Performed at [facility], [place] [address] [place], [place] [address]  Magnesium     Status: None   Collection Time: 11/06/22  7:15 AM  Result Value Ref Range   Magnesium 1.7 1.7 - 2.4 mg/dL    Comment: Performed at [facility], [place] [address] [place], [place] [address]  CBC     Status: Abnormal   Collection Time: 11/06/22  7:15 AM  Result Value Ref Range   WBC 6.7 4.0 - 10.5 K/uL   RBC 3.14 (L) 4.22 - 5.81 MIL/uL   Hemoglobin 10.4 (L) 13.0 - 17.0 g/dL   HCT 30.8 (L) 39.0 - 52.0 %   MCV 98.1 80.0 - 100.0 fL   MCH 33.1 26.0 - 34.0 pg   MCHC 33.8 30.0 - 36.0 g/dL   RDW 13.2 11.5 - 15.5 %   Platelets 65 (L) 150 - 400 K/uL    Comment: Immature Platelet Fraction may be clinically indicated, consider ordering this additional test [id number] CONSISTENT WITH PREVIOUS RESULT    nRBC 0.0 0.0 - 0.2 %    Comment: Performed at [facility], [place] [address] [place], [place] [address]  Hemoglobin and hematocrit, blood     Status: Abnormal   Collection Time: 11/06/22  4:40 PM  Result Value Ref Range   Hemoglobin 9.9 (L) 13.0 - 17.0 g/dL   HCT 29.2 (L) 39.0 - 52.0 %    Comment: Performed at [facility], [place] [address] [place], [place] [address]  DIC Panel ONCE - STAT      Status: Abnormal   Collection Time: 11/06/22  4:40 PM  Result Value Ref Range   Prothrombin Time 15.9 (H) 11.4 - 15.2 seconds   INR 1.3 (H) 0.8 - 1.2    Comment: (NOTE) INR goal varies based on device and disease states.    aPTT 52 (H) 24 - 36 seconds    Comment:        IF BASELINE aPTT IS ELEVATED, SUGGEST PATIENT RISK ASSESSMENT BE USED TO DETERMINE APPROPRIATE ANTICOAGULANT THERAPY.    Fibrinogen 793 (H) 210 - 475 mg/dL    Comment: (NOTE) Fibrinogen results may be underestimated in patients receiving thrombolytic therapy.    D-Dimer, Quant 4.15 (H) 0.00 - 0.50 ug/mL-FEU    Comment: (NOTE) At the manufacturer cut-off value of 0.5 g/mL FEU, this assay has a negative predictive value of 95-100%.This assay is intended for use in conjunction with a clinical pretest probability (PTP) assessment model to exclude pulmonary embolism (PE) and deep venous thrombosis (DVT) in outpatients suspected of PE or DVT. Results should be correlated with clinical presentation.    Platelets 68 (L) 150 - 400 K/uL    Comment: SPECIMEN CHECKED FOR CLOTS Immature Platelet Fraction may be clinically indicated, consider ordering this additional test [id number] REPEATED TO VERIFY PLATELET COUNT CONFIRMED BY SMEAR    Smear Review NO SCHISTOCYTES SEEN     Comment: Performed at [place] [address] [place],  [address]  Basic metabolic  panel     Status: Abnormal   Collection Time: 11/07/22  3:06 AM  Result Value Ref Range   Sodium 137 135 - 145 mmol/L   Potassium 3.2 (L) 3.5 - 5.1 mmol/L   Chloride 106 98 - 111 mmol/L   CO2 20 (L) 22 - 32 mmol/L   Glucose, Bld 114 (H) 70 - 99 mg/dL    Comment: Glucose reference range applies only to samples taken after fasting for at least 8 hours.   BUN 48 (H) 8 - 23 mg/dL   Creatinine, Ser 3.74 (H) 0.61 - 1.24 mg/dL   Calcium 8.6 (L) 8.9 - 10.3 mg/dL   GFR, Estimated 16 (L) >60 mL/min    Comment: (NOTE) Calculated using the  CKD-EPI Creatinine Equation (2021)    Anion gap 11 5 - 15    Comment: Performed at [facility], [place] [address], [place], [place] [address]  Magnesium     Status: None   Collection Time: 11/07/22  3:06 AM  Result Value Ref Range   Magnesium 2.1 1.7 - 2.4 mg/dL    Comment: Performed at [facility], [place] [address], [place], [place] [address]  CBC     Status: Abnormal   Collection Time: 11/07/22  3:06 AM  Result Value Ref Range   WBC 5.5 4.0 - 10.5 K/uL   RBC 3.30 (L) 4.22 - 5.81 MIL/uL   Hemoglobin 10.8 (L) 13.0 - 17.0 g/dL   HCT 32.5 (L) 39.0 - 52.0 %   MCV 98.5 80.0 - 100.0 fL   MCH 32.7 26.0 - 34.0 pg   MCHC 33.2 30.0 - 36.0 g/dL   RDW 13.1 11.5 - 15.5 %   Platelets 71 (L) 150 - 400 K/uL    Comment: SPECIMEN CHECKED FOR CLOTS Immature Platelet Fraction may be clinically indicated, consider ordering this additional test [id number] CONSISTENT WITH PREVIOUS RESULT REPEATED TO VERIFY    nRBC 0.0 0.0 - 0.2 %    Comment: Performed at [facility], [place] [address] [place], [place] [address]    MICRO:  IMAGING: CT ABDOMEN WO CONTRAST  Result Date: 11/06/2022 CLINICAL DATA:  Recently placed nephrostomy tube with hematuria. History of bladder cancer. EXAM: CT ABDOMEN WITHOUT CONTRAST TECHNIQUE: Multidetector CT imaging of the abdomen was performed following the standard protocol without IV contrast. RADIATION DOSE REDUCTION: This exam was performed according to the departmental dose-optimization program which includes automated exposure control, adjustment of the mA and/or kV according to patient size and/or use of iterative reconstruction technique. COMPARISON:  November 04, 2022 FINDINGS: Lower chest: Small bilateral pleural effusions are new since November 04, 2022. Basilar airspace disease likely volume loss associated with effusions. No pericardial effusion. Heart is incompletely imaged. No chest wall abnormality.  Hepatobiliary: Moderate to marked hepatic steatosis. No pericholecystic stranding or gross biliary duct distension. Pancreas: Pancreas with normal contours, no signs of inflammation or peripancreatic fluid. Spleen: Normal. Adrenals/Urinary Tract: Adrenal glands with small low-density RIGHT adrenal lesion measuring 13 Hounsfield units at 14 x 14 mm (image 19/2). This showed relative washout of 61% on more remote imaging from July 2023 and is compatible with small adrenal adenoma. LEFT adrenal is normal. LEFT kidney with cortical scarring post nephrostomy tube placement. No perinephric hematoma. High density material within the collecting systems and renal pelvis following recent nephrostomy tube placement and injection of contrast into the collecting systems. Persistent ureteral dilation. Nephrostomy tube with marker for sideholes and the pigtail within the renal pelvis  of the LEFT kidney entering via posterolateral approach. Obstructing calculus not imaged on this abdominal CT. Marked RIGHT renal cortical atrophy similar to previous imaging. Stomach/Bowel: No acute gastrointestinal process to the extent evaluated on this abdominal CT. Vascular/Lymphatic: Atherosclerotic changes of the abdominal aorta without aneurysmal dilation. There is no gastrohepatic or hepatoduodenal ligament lymphadenopathy. No retroperitoneal or mesenteric lymphadenopathy. Other: No ascites.  No free air. Musculoskeletal: Mild stranding in the body wall at the site of nephrostomy tube insertion. No acute bone finding. No destructive bone process. Spinal degenerative changes. IMPRESSION: 1. Post nephrostomy tube placement, well position pigtail within the renal pelvis as discussed. There is persistent but perhaps slightly improved hydro nephrosis and ureteral dilation. 2. Small bilateral pleural effusions are new since November 04, 2022. 3. Moderate to marked hepatic steatosis. 4. 14 mm RIGHT adrenal adenoma. No additional dedicated imaging  follow-up is recommended for this finding. Based on current clinical literature, biochemical evaluation to exclude possible functioning adrenal nodule is suggested if not already performed. Please refer to current clinical guidelines for detailed recommendations. NEJM 9528:413 154-51 5. Aortic atherosclerosis. Aortic Atherosclerosis (ICD10-I70.0). Electronically Signed   By: Zetta Bills M.D.   On: 11/06/2022 14:29    HISTORICAL MICRO/IMAGING  Assessment/Plan:  76yo M with sepsis due to serratia bacteremia stemming from urinary source with hydronephrosis s/p nephrostomy placement - continue on cefepime for the time being. - awaiting to hear plans for internalization of drain to double j stent - would recommend to continue abtx for 14 days but may need to extend depending on when stent placed - would continue on iv abtx during hospitalization and can place on oral levofloxacin to complete course  Leukocytosis = improved  Will have dr wallace follow up tomorrow

## 2022-11-07 NOTE — Progress Notes (Signed)
BP elevated and pt is moaning in pain. PRN Fentanyl increased per order and administered. Dressing change due, left dry and intact until pain is better controlled to allow the pt to reposition.

## 2022-11-07 NOTE — Progress Notes (Signed)
Referring Physician(s): Clark,L/Wrenn,J  Supervising Physician: Jacqulynn Cadet  Patient Status:  [facility] - In-pt  Chief Complaint: Urosepsis, left UVJ obstructing stone, hematuria    Subjective: Patient a little more interactive today, wife in room, no reported fever, chills, nausea, vomiting or worsening left flank pain; nephrostomy draining okay with blood-tinged urine in bag, improved appearance since yesterday   Allergies: Patient has no known allergies.  Medications: Prior to Admission medications   Medication Sig Start Date End Date Taking? Authorizing Provider  abiraterone acetate (ZYTIGA) 250 MG tablet Take 1,000 mg by mouth daily. Take on an empty stomach   Yes [provider]  acetaminophen (TYLENOL) 500 MG tablet Take 1,000 mg by mouth daily as needed for headache or moderate pain.   Yes [provider]  allopurinol (ZYLOPRIM) 300 MG tablet Take 300 mg by mouth daily.   Yes [provider]  aspirin EC 81 MG tablet Take 81 mg by mouth daily.   Yes [provider]  atorvastatin (LIPITOR) 80 MG tablet Take 40 mg by mouth daily.   Yes [provider]  Cholecalciferol (VITAMIN D3) 25 MCG (1000 UT) CAPS Take 1,000 Units by mouth daily.   Yes [provider]  gabapentin (NEURONTIN) 300 MG capsule Take 600 mg by mouth daily.   Yes [provider]  metoprolol tartrate (LOPRESSOR) 25 MG tablet Take 50 mg by mouth every evening.   Yes [provider]  Multiple Vitamin (MULTIVITAMIN PO) Take 1 tablet by mouth daily.   Yes [provider]  nitroGLYCERIN (NITROSTAT) 0.4 MG SL tablet Place 1 tablet (0.4 mg total) under the tongue every 5 (five) minutes as needed for chest pain. 10/06/13  Yes Plotnikov, Evie Lacks, MD  Omega-3 Fatty Acids (FISH OIL) 1200 MG CAPS Take 1,200 mg by mouth daily.   Yes [provider]  OVER THE COUNTER MEDICATION Take 750 mg by mouth daily. Osteo Bi Flex Glucosamin    Yes [provider]  OVER THE COUNTER MEDICATION Take 1 tablet by mouth daily. Calcium 1200 mg + D3 25 mcg (2000 units)   Yes [provider]  predniSONE (DELTASONE) 5 MG tablet Take 5 mg by mouth daily.   Yes [provider]  traMADol (ULTRAM) 50 MG tablet Take 25 mg by mouth daily as needed for moderate pain. 05/17/22  Yes [provider]  triamcinolone cream (KENALOG) 0.1 % Apply 1 Application topically daily.   Yes [provider]  vitamin C (ASCORBIC ACID) 500 MG tablet Take 500 mg by mouth daily.   Yes [provider]  Zinc 50 MG TABS Take 50 mg by mouth daily in the afternoon.   Yes [provider]  COVID-19 mRNA bivalent vaccine, Pfizer, (PFIZER COVID-19 VAC BIVALENT) injection Inject into the muscle. 09/12/21   Carlyle Basques, MD  ondansetron (ZOFRAN-ODT) 4 MG disintegrating tablet Take 1 tablet (4 mg total) by mouth every 8 (eight) hours as needed for nausea or vomiting. Patient not taking: Reported on 11/05/2022 05/29/22   Davonna Belling, MD     Vital Signs: BP (!) 160/102 (BP Location: Left Arm)   Pulse 92   Temp 98.3 F (36.8 C) (Oral)   Resp 19   Ht '5\' 10"'$  (1.778 m)   Wt 211 lb 13.8 oz (96.1 kg)   SpO2 95%   BMI 30.40 kg/m   Physical Exam patient awake, answers few simple questions okay, left nephrostomy intact, insertion site okay, mildly tender, output 200 cc blood-tinged  urine, output yesterday 1.8 L  Imaging: CT ABDOMEN WO CONTRAST  Result Date: 11/06/2022 CLINICAL DATA:  Recently placed nephrostomy tube with hematuria. History of bladder cancer. EXAM: CT ABDOMEN WITHOUT CONTRAST TECHNIQUE: Multidetector CT imaging of the abdomen was performed following the standard protocol without IV contrast. RADIATION DOSE REDUCTION: This exam was performed according to the departmental dose-optimization program which includes automated exposure control, adjustment of the mA and/or kV according to patient size and/or  use of iterative reconstruction technique. COMPARISON:  November 04, 2022 FINDINGS: Lower chest: Small bilateral pleural effusions are new since November 04, 2022. Basilar airspace disease likely volume loss associated with effusions. No pericardial effusion. Heart is incompletely imaged. No chest wall abnormality. Hepatobiliary: Moderate to marked hepatic steatosis. No pericholecystic stranding or gross biliary duct distension. Pancreas: Pancreas with normal contours, no signs of inflammation or peripancreatic fluid. Spleen: Normal. Adrenals/Urinary Tract: Adrenal glands with small low-density RIGHT adrenal lesion measuring 13 Hounsfield units at 14 x 14 mm (image 19/2). This showed relative washout of 61% on more remote imaging from July 2023 and is compatible with small adrenal adenoma. LEFT adrenal is normal. LEFT kidney with cortical scarring post nephrostomy tube placement. No perinephric hematoma. High density material within the collecting systems and renal pelvis following recent nephrostomy tube placement and injection of contrast into the collecting systems. Persistent ureteral dilation. Nephrostomy tube with marker for sideholes and the pigtail within the renal pelvis of the LEFT kidney entering via posterolateral approach. Obstructing calculus not imaged on this abdominal CT. Marked RIGHT renal cortical atrophy similar to previous imaging. Stomach/Bowel: No acute gastrointestinal process to the extent evaluated on this abdominal CT. Vascular/Lymphatic: Atherosclerotic changes of the abdominal aorta without aneurysmal dilation. There is no gastrohepatic or hepatoduodenal ligament lymphadenopathy. No retroperitoneal or mesenteric lymphadenopathy. Other: No ascites.  No free air. Musculoskeletal: Mild stranding in the body wall at the site of nephrostomy tube insertion. No acute bone finding. No destructive bone process. Spinal degenerative changes. IMPRESSION: 1. Post nephrostomy tube placement, well  position pigtail within the renal pelvis as discussed. There is persistent but perhaps slightly improved hydro nephrosis and ureteral dilation. 2. Small bilateral pleural effusions are new since November 04, 2022. 3. Moderate to marked hepatic steatosis. 4. 14 mm RIGHT adrenal adenoma. No additional dedicated imaging follow-up is recommended for this finding. Based on current clinical literature, biochemical evaluation to exclude possible functioning adrenal nodule is suggested if not already performed. Please refer to current clinical guidelines for detailed recommendations. NEJM 8657:846 154-51 5. Aortic atherosclerosis. Aortic Atherosclerosis (ICD10-I70.0). Electronically Signed   By: Zetta Bills M.D.   On: 11/06/2022 14:29   IR NEPHROSTOMY PLACEMENT LEFT  Result Date: 11/05/2022 INDICATION: 76 year old with urosepsis secondary to an obstructing stone at the left ureterovesical junction. Right kidney is atrophic. EXAM: LEFT PERCUTANEOUS NEPHROSTOMY TUBE PLACEMENT WITH ULTRASOUND AND FLUOROSCOPIC GUIDANCE COMPARISON:  CT 11/04/2022 MEDICATIONS: Antibiotics were given prior to the procedure by the primary care team. ANESTHESIA/SEDATION: Moderate (conscious) sedation was employed during this procedure. A total of Versed 1.0 mg and Fentanyl 25 mcg was administered intravenously by the radiology nurse. Total intra-service moderate Sedation Time: 19 minutes. The patient's level of consciousness and vital signs were monitored continuously by radiology nursing throughout the procedure under my direct supervision. CONTRAST:  10 mL Omnipaque 300-administered into the collecting system(s) FLUOROSCOPY: Radiation Exposure Index (as provided by the fluoroscopic device): 28 mGy Kerma COMPLICATIONS: None immediate. PROCEDURE: Informed written consent was obtained from the patient's wife after a thorough  discussion of the procedural risks, benefits and alternatives. All questions were addressed. Maximal Sterile Barrier  Technique was utilized including caps, mask, sterile gowns, sterile gloves, sterile drape, hand hygiene and skin antiseptic. A timeout was performed prior to the initiation of the procedure. Patient was placed prone on the interventional table with the left side slightly rolled up. Left flank was prepped and draped in sterile fashion. Ultrasound was used to identify the left kidney from an intercostal approach. Dilated mid/upper pole calyx was targeted. Skin was anesthetized with 1% lidocaine and a small incision was made. Using ultrasound guidance, a 21 gauge needle was directed into the dilated collecting system and contrast was injected to confirm placement in the collecting system. 0.018 wire was advanced into the collecting system and Accustick dilator set was placed. Tract was dilated over a J wire and a 10.2 [place] multipurpose drain was advanced over the wire and reconstituted in the renal pelvis. Cloudy purulent-looking yellow fluid was removed from the renal collecting system. Small amount of contrast was injected to confirm placement in the renal pelvis. Nephrostomy tube was attached to a gravity bag and sutured to skin. Fluoroscopic and ultrasound images were taken and saved for documentation. FINDINGS: Moderate left hydronephrosis. It was difficult to visualize the kidney due to overlying ribs. Dilated calyx in the mid/upper pole region was accessed. Left renal collecting system was decompressed at the end of the procedure. IMPRESSION: Successful placement of left percutaneous nephrostomy tube using ultrasound and fluoroscopic guidance. Electronically Signed   By: Markus Daft M.D.   On: 11/05/2022 09:21   CT ABDOMEN PELVIS WO CONTRAST  Result Date: 11/04/2022 CLINICAL DATA:  Abdominal pain, postop EXAM: CT ABDOMEN AND PELVIS WITHOUT CONTRAST TECHNIQUE: Multidetector CT imaging of the abdomen and pelvis was performed following the standard protocol without IV contrast. RADIATION DOSE REDUCTION: This  exam was performed according to the departmental dose-optimization program which includes automated exposure control, adjustment of the mA and/or kV according to patient size and/or use of iterative reconstruction technique. COMPARISON:  05/29/2022 FINDINGS: Lower chest: No acute abnormality Hepatobiliary: Diffuse low-density throughout the liver compatible with fatty infiltration. No focal abnormality. Gallbladder unremarkable. Pancreas: No focal abnormality or ductal dilatation. Spleen: No focal abnormality.  Normal size. Adrenals/Urinary Tract: Adrenal glands normal. Severe left hydronephrosis and perinephric stranding due to 4 mm left UVJ stone. Multiple nonobstructing stones in the left kidney. No stones or hydronephrosis on the right. Urinary bladder decompressed, grossly unremarkable. Stomach/Bowel: Sigmoid diverticulosis. No active diverticulitis. Stomach and small bowel decompressed, unremarkable. Vascular/Lymphatic: Aortic atherosclerosis. No evidence of aneurysm or adenopathy. Reproductive: No visible focal abnormality.   Penile implant noted. Other: No free fluid or free air. Musculoskeletal: No acute bony abnormality. IMPRESSION: Severe left hydronephrosis and perinephric stranding due to 4 mm left UVJ stone. Left nephrolithiasis. Hepatic steatosis. Aortic atherosclerosis. Sigmoid diverticulosis. Electronically Signed   By: Rolm Baptise M.D.   On: 11/04/2022 21:57   DG Chest 2 View  Result Date: 11/04/2022 CLINICAL DATA:  Shortness of breath EXAM: CHEST - 2 VIEW COMPARISON:  05/29/2022 FINDINGS: Cardiac shadow is stable. Lungs are well aerated bilaterally. No focal infiltrate or effusion is seen. No bony abnormality is noted. IMPRESSION: No acute abnormality seen. Electronically Signed   By: Inez Catalina M.D.   On: 11/04/2022 21:12    Labs:  CBC: Recent Labs    11/04/22 2047 11/05/22 0639 11/06/22 0715 11/06/22 1640 11/07/22 0306  WBC 21.1* 11.8* 6.7  --  5.5  HGB 14.0 11.3*  10.4* 9.9*  10.8*  HCT 41.9 34.0* 30.8* 29.2* 32.5*  PLT 129* 96* 65* 68* 71*    COAGS: Recent Labs    11/06/22 1640  INR 1.3*  APTT 52*    BMP: Recent Labs    11/04/22 2047 11/05/22 0639 11/06/22 0249 11/06/22 0715 11/07/22 0306  NA 140 139  --  138 137  K 3.7 4.1  --  3.3* 3.2*  CL 99 107  --  109 106  CO2 23 22  --  20* 20*  GLUCOSE 150* 114*  --  136* 114*  BUN 35* 38*  --  53* 48*  CALCIUM 9.4 8.1*  --  8.0* 8.6*  CREATININE 3.92* 4.17* 4.52* 4.38* 3.74*  GFRNONAA 15* 14* 13* 13* 16*    LIVER FUNCTION TESTS: Recent Labs    05/29/22 0937 11/04/22 2047 11/05/22 0639  BILITOT 1.1 1.3* 1.0  AST 52* 40 48*  ALT '23 14 16  '$ ALKPHOS 67 49 37*  PROT 8.2* 6.9 5.5*  ALBUMIN 4.4 4.1 2.7*    Assessment and Plan: Patient with history of left UVJ stone with obstruction/hydronephrosis, sepsis, hematuria, acute on chronic kidney disease, coronary artery disease, gout, bladder cancer, prostate cancer, functionally solitary left kidney with right renal atrophy; status post left nephrostomy on 11/05/22; afebrile, WBC normal, hemoglobin 10.8, platelets 71k(68), creatinine 3.74(4.38), urine culture- serratia;  CT A/P yesterday revealed:  1. Post nephrostomy tube placement, well position pigtail within the renal pelvis as discussed. There is persistent but perhaps slightly improved hydro nephrosis and ureteral dilation. 2. Small bilateral pleural effusions are new since November 04, 2022. 3. Moderate to marked hepatic steatosis. 4. 14 mm RIGHT adrenal adenoma. No additional dedicated imaging follow-up is recommended for this finding. Based on current clinical literature, biochemical evaluation to exclude possible functioning adrenal nodule is suggested if not already performed. Please refer to current clinical guidelines for detailed recommendations. NEJM [id number] 154-51 5. Aortic atherosclerosis  As per urology patient will need left ureteroscopy once covered from sepsis and  potential internalization to double-J stent when clinical condition improves  Electronically Signed: D. Rowe Robert, PA-C 11/07/2022, 11:54 AM   I spent a total of 15 Minutes at the the patient's bedside AND on the patient's hospital floor or unit, greater than 50% of which was counseling/coordinating care for left nephrostomy    Patient ID: [name], male   DOB: 07-Jul-1946, 76 y.o.   MRN: [id number]

## 2022-11-07 NOTE — Progress Notes (Signed)
Pts BP elevated and pain still 5-6 with PRN fentanyl. I called Lysbeth Galas at [employer], MD will be notified and to administer PO tramadol a little early and provide update of BP to see if the pain med was effective.

## 2022-11-07 NOTE — Progress Notes (Signed)
[place] Progress Note Patient Name: [name] DOB: 11/29/1945 MRN: [id number]   Date of Service  11/07/2022  HPI/Events of Note  Patient c/o pain which is not relieved by current Fentanyl 25 mcg IV Q 2 hours PRN.   eICU Interventions  Plan: Increase Fentanyl dose to 50-100 mcg IV Q 2 hours PRN pain.      Intervention Category Major Interventions: Other:  Lysle Dingwall 11/07/2022, 5:41 AM

## 2022-11-07 NOTE — TOC Transition Note (Signed)
Transition of Care [facility]) - CM/SW Discharge Note   Patient Details  Name: [name] MRN: [id number] Date of Birth: 1946/09/27  Transition of Care [facility]) CM/SW Contact:  Roseanne Kaufman, RN Phone Number: 11/07/2022, 7:02 PM   Clinical Narrative:   TOC will continue to follow for discharge needs.      Barriers to Discharge: Continued Medical Work up   Patient Goals and CMS Choice     Choice offered to / list presented to : NA  Discharge Placement                       Discharge Plan and Services In-house Referral: NA Discharge Planning Services: CM Consult                                 Social Determinants of Health (SDOH) Interventions Housing Interventions: Intervention Not Indicated   Readmission Risk Interventions     No data to display

## 2022-11-07 NOTE — Progress Notes (Signed)
[place] Progress Note Patient Name: [name] DOB: September 10, 1946 MRN: [id number]   Date of Service  11/07/2022  HPI/Events of Note  Hypertension - BP = 175/103.   eICU Interventions  Plan: Hydralazine 10 mg IV Q 4 hours PRN SBP > 160 or DBP > 100.     Intervention Category Major Interventions: Hypertension - evaluation and management  Dajia Gunnels [place] 11/07/2022, 1:21 AM

## 2022-11-07 NOTE — Progress Notes (Addendum)
NAME:  [name], MRN:  [id number], DOB:  Jun 06, 1946, LOS: 2 ADMISSION DATE:  11/04/2022, CONSULTATION DATE:  11/04/22 REFERRING MD:  [name], CHIEF COMPLAINT:  sepsis, obstructing urinary stone   History of Present Illness:  [name] is a 76 year old gentleman with a history of CKD stage III, coronary artery disease, gout, bladder cancer, prostate adenocarcinoma, nephrolithiasis who presented from urgent care with hypotension and tachycardia.  He has been having pain from kidney stones since 12/10 with associated diarrhea.  He came to the ED and was found to have left hydronephrosis and pyelonephritis with an obstructing UPJ stone.  He was transferred from [place] ED to [facility] for evaluation.  Due to his extensive urologic history urology has recommended nephrostomy tube placement.  He follows with urologist [name] at the [facility]. He denies other new symptoms. Per chart review, he admitted to a weakly positive covid test in the ED triage area.  He has had several episodes of diarrhea since presenting to the ED.   Pertinent  Medical History  Prostate adenocarcinoma Bladder cancer CAD Kidney stones CKD 3 Gout Hypertension Hyperlipidemia  Significant Hospital Events: Including procedures, antibiotic start and stop dates in addition to other pertinent events   12/11 presented to the ED, transferred to [facility] overnight 12/12 off pressors 12/14-uncontrolled discomfort  Interim History / Subjective:  Awake, interactive, still complain of pain and discomfort  Objective   Blood pressure (!) 182/99, pulse 95, temperature 98.5 F (36.9 C), temperature source Oral, resp. rate 18, height '5\' 10"'$  (1.778 m), weight 96.1 kg, SpO2 92 %.        Intake/Output Summary (Last 24 hours) at 11/07/2022 0801 Last data filed at 11/07/2022 9767 Gross per 24 hour  Intake 1634.46 ml  Output 3400 ml  Net -1765.54 ml    Filed Weights   11/05/22 0435 11/06/22 0500 11/07/22 0500   Weight: 94.8 kg 96.1 kg 96.1 kg   Examination: General: Chronically ill-appearing HENT: Moist oral mucosa Lungs: On room air, clear to auscultation Cardiovascular: S1-S2 appreciated Abdomen: Soft, bowel sounds appreciated Extremities: No edema, no cyanosis  Neuro: Awake and alert, moving all extremities Derm: No skin rash  BUN/creatinine did improve Potassium 3.2 BUN 63-48 Creatinine 4.38-3.74 H/H stable  Fibrinogen of 793 D-dimer 4.15  Serratia sensitive to cefepime  Resolved Hospital Problem list     Assessment & Plan:   Septic shock due to urinary tract infection Left pyelonephritis with obstructing UPJ stone -Continue cefepime  Hydronephrosis Obstructive pyelonephritis -S/p nephrostomy tube placement -Appreciate urology assistance with care  Acute kidney injury on chronic kidney disease -Will continue IV fluids -Avoid nephrotoxic agents -Maintain renal perfusion -Continue to monitor renal functions -Continue LR  -Replete electrolytes  Hyperglycemia -Continue sliding scale  Pain and discomfort -Continue Ultram, fentanyl as needed -Continue oxybutynin for possible spasms -Add Neurontin-patient was on this at home  Patient remains DNR  Discussed with spouse at bedside  Continue home meds including Zytiga  Leukocytosis better -H&H stable -Kidney functions stable  Consult ID Dr. Roni Hayes will see patient tomorrow 12/15 -If stable with nephrostomy tube in place, may plan for intervention next week -He will be in tomorrow to discuss this further with patient   Best Practice (right click and "Reselect all SmartList Selections" daily)   Diet/type: Regular consistency (see orders) DVT prophylaxis: prophylactic heparin  GI prophylaxis: N/A Lines: N/A Foley:  N/A Code Status:  DNR Last date of multidisciplinary goals of care discussion '[ ]'$   Labs  CBC: Recent Labs  Lab 11/04/22 2047 11/05/22 0639 11/06/22 0715 11/06/22 1640 11/07/22 0306   WBC 21.1* 11.8* 6.7  --  5.5  NEUTROABS 19.0*  --   --   --   --   HGB 14.0 11.3* 10.4* 9.9* 10.8*  HCT 41.9 34.0* 30.8* 29.2* 32.5*  MCV 98.6 99.1 98.1  --  98.5  PLT 129* 96* 65* 68* 71*     Basic Metabolic Panel: Recent Labs  Lab 11/04/22 2047 11/05/22 0639 11/06/22 0249 11/06/22 0715 11/07/22 0306  NA 140 139  --  138 137  K 3.7 4.1  --  3.3* 3.2*  CL 99 107  --  109 106  CO2 23 22  --  20* 20*  GLUCOSE 150* 114*  --  136* 114*  BUN 35* 38*  --  53* 48*  CREATININE 3.92* 4.17* 4.52* 4.38* 3.74*  CALCIUM 9.4 8.1*  --  8.0* 8.6*  MG  --  1.3*  --  1.7 2.1  PHOS  --  4.2  --   --   --     GFR: Estimated Creatinine Clearance: 19.5 mL/min (A) (by C-G formula based on SCr of 3.74 mg/dL (H)). Recent Labs  Lab 11/04/22 2047 11/04/22 2250 11/05/22 0639 11/06/22 0715 11/07/22 0306  WBC 21.1*  --  11.8* 6.7 5.5  LATICACIDVEN 7.9* 5.8* 2.7*  --   --      Liver Function Tests: Recent Labs  Lab 11/04/22 2047 11/05/22 0639  AST 40 48*  ALT 14 16  ALKPHOS 49 37*  BILITOT 1.3* 1.0  PROT 6.9 5.5*  ALBUMIN 4.1 2.7*    Recent Labs  Lab 11/04/22 2047  LIPASE 44    No results for input(s): "AMMONIA" in the last 168 hours.  ABG    Component Value Date/Time   TCO2 25 12/30/2017 0649     Coagulation Profile: Recent Labs  Lab 11/06/22 1640  INR 1.3*    Cardiac Enzymes: No results for input(s): "CKTOTAL", "CKMB", "CKMBINDEX", "TROPONINI" in the last 168 hours.  HbA1C: Hgb A1c MFr Bld  Date/Time Value Ref Range Status  08/09/2011 11:29 AM 5.8 4.6 - 6.5 % Final    Comment:    Glycemic Control Guidelines for People with Diabetes:Non Diabetic:  <6%Goal of Therapy: <7%Additional Action Suggested:  >8%   01/19/2010 10:29 PM  4.6 - 6.1 % Final   5.6 (NOTE) The ADA recommends the following therapeutic goal for glycemic control related to Hgb A1c measurement: Goal of therapy: <6.5 Hgb A1c  Reference: American Diabetes Association: Clinical Practice  Recommendations 2010, Diabetes Care, 2010, 33: (Suppl  1).    CBG: Recent Labs  Lab 11/05/22 0137 11/05/22 1200  GLUCAP 127* 104*     Review of Systems:   Review of Systems  Constitutional:  Positive for chills, fever and malaise/fatigue.  Respiratory:  Negative for sputum production.   Cardiovascular: Negative.   Gastrointestinal:  Positive for abdominal pain and diarrhea.  Genitourinary:  Positive for dysuria.     Past Medical History:  He,  has a past medical history of Atrophy of right kidney, CKD (chronic kidney disease), stage III ([place]), CORONARY ARTERY DISEASE (CARDIOLOGIST--  DR WALL AND DR Angelena Form----   [facility] [place]), Cystitis, radiation, DDD (degenerative disc disease), lumbar, Diverticulosis of colon, GOUT (06/14/2008), H/O agent Orange exposure, History of bladder cancer (2016), History of kidney stones, History of myocardial infarction, History of recurrent UTIs, Hyperlipidemia, Hypertension, Nephrolithiasis, Nocturia, Normal exercise  tolerance test results, OA (osteoarthritis), Organic impotence, PONV (postoperative nausea and vomiting), Recurrent prostate adenocarcinoma [facility]) (urologist--  dr Irine Seal---- per pt last PSA 0.12 as of 12-02-2017), Rosacea, S/P coronary artery stent placement, S/P radiation therapy (10/02/11 - 11/25/11), Spinal stenosis, SUI (stress urinary incontinence), male, and Wears glasses.   Surgical History:   Past Surgical History:  Procedure Laterality Date   ANTERIOR CERVICAL DECOMP/DISCECTOMY FUSION  2001   w/ removal bone spur--  1 level   BONE CYST EXCISION  2012   LUMBAR   CARDIOVASCULAR STRESS TEST  04-23-2011  dr Angelena Form   Low risk nuclear study/ fixed mixed basal inferior and inferolateral perfusion defect with wall motion abnormality that suggest prior MI, no significant ishemia/  ef 54%   COLONOSCOPY  03-04-2007   CORONARY ANGIOPLASTY  01-22-2010  dr Darnell Level brodie   successfully cutting  angioplasty to in-stent restenosis  mCFX   CORONARY ANGIOPLASTY WITH STENT PLACEMENT  04-29-2003  dr Lyndel Safe   BMS x1 and DES x1 to mid left CFX for total occlusion 99%/  nonobstructive dLAD 60% and pLAD30%,  pRCA 30%,  lateral and mid inferior akinesis,  moderate irregularities LM,  D2 and D3 40%,  ef 50%   CYSTO W/ PLACEMENT RIGHT URETERAL STENT  07-19-2004   CYSTOSCOPY N/A 01/03/2015   Procedure: CYSTOSCOPY WITH FULGURATION OF BLEEDERS;  Surgeon: Malka So, MD;  Location: [facility];  Service: Urology;  Laterality: N/A;   CYSTOSCOPY W/ URETERAL STENT PLACEMENT Left 01/05/2018   Procedure: CYSTOSCOPY WITH LEFT RETROGRADE PYELOGRAM/ LEFT URETEROSCOPY/STONE BASKET REMOVAL/ LEFT URETERAL STENT PLACEMENT;  Surgeon: Ceasar Mons, MD;  Location: [facility];  Service: Urology;  Laterality: Left;   CYSTOSCOPY WITH BIOPSY N/A 12/01/2014   Procedure: CYSTOSCOPY WITH BIOPSY/FULGURATION;  Surgeon: Malka So, MD;  Location: [facility];  Service: Urology;  Laterality: N/A;   CYSTOSCOPY WITH RETROGRADE PYELOGRAM, URETEROSCOPY AND STENT PLACEMENT Right 05/16/2016   Procedure: CYSTOSCOPY WITH RIGHT RETROGRADE PYELOGRAM, URETEROSCOPY WITH LASER AND STENT PLACEMENT;  Surgeon: Irine Seal, MD;  Location: [facility];  Service: Urology;  Laterality: Right;   CYSTOSCOPY WITH STENT PLACEMENT Left 12/04/2017   Procedure: CYSTOSCOPY WITH STENT PLACEMENT;  Surgeon: Irine Seal, MD;  Location: [facility];  Service: Urology;  Laterality: Left;   CYSTOSCOPY/URETEROSCOPY/HOLMIUM LASER/STENT PLACEMENT Left 12/30/2017   Procedure: CYSTOSCOPY LEFT URETEROSCOPY WITH HOLMIUM LASER AND  LEFT STENT PLACEMENT;  Surgeon: Irine Seal, MD;  Location: [facility];  Service: Urology;  Laterality: Left;   EXTRACORPOREAL SHOCK WAVE LITHOTRIPSY Left 09-16-2013   EXTRACORPOREAL SHOCK WAVE LITHOTRIPSY Left 12/08/2017   Procedure: LEFT EXTRACORPOREAL SHOCK WAVE  LITHOTRIPSY (ESWL);  Surgeon: Franchot Gallo, MD;  Location: [facility];  Service: Urology;  Laterality: Left;   HOLMIUM LASER APPLICATION Right 4/43/1540   Procedure: HOLMIUM LASER APPLICATION;  Surgeon: Irine Seal, MD;  Location: [facility];  Service: Urology;  Laterality: Right;   INGUINAL HERNIA REPAIR Left 11-03-2003   IR NEPHROSTOMY PLACEMENT LEFT  11/05/2022   LUMBAR [place]   LUMBAR LAMINECTOMY  04-25-2011   LUMBAR LAMINECTOMY/DECOMPRESSION MICRODISCECTOMY Right 11/29/2015   Procedure:  DECOMPRESSION LUMBAR LAMINECTOMY L4-L5 MICRODISCECTOMY L4-L5 LEFT    EXCISION SENOVAL CYST LUMBAR 4-5 RIGHT  PARICAL FACETECTOMY LUMBAR 4-5 RIGHT ;  Surgeon: Latanya Maudlin, MD;  Location: [facility];  Service: Orthopedics;  Laterality: Right;   PENILE PROSTHESIS IMPLANT N/A 02/16/2013   Procedure: REPLACEMENT OF AMS PENILE PROTHESIS INFLATABLE;  Surgeon: Malka So, MD;  Location: [facility];  Service: Urology;  Laterality: N/A;   PENILE PROSTHESIS PLACEMENT  2003   RADICAL PROSTATECTOMY W/ BILATERAL PELVIC LYMPHADENECTOMY  08-07-2000   RIGHT URETEROSCOPIC LASER LITHOTRIPSY STONE EXTRACTION AND STENT  07-26-2004   TRANSTHORACIC ECHOCARDIOGRAM  01-21-2010   mild LVH/  ef 60-65%/  trivial TR     Social History:   reports that he quit smoking about 19 years ago. His smoking use included cigarettes. He has a 60.00 pack-year smoking history. He has never used smokeless tobacco. He reports current alcohol use of about 21.0 standard drinks of alcohol per week. He reports that he does not use drugs.   Family History:  His family history includes Heart attack in his father; Hypertension in an other family member.   Allergies No Known Allergies   The patient is critically ill with multiple organ systems failure and requires high complexity decision making for assessment and support, frequent evaluation and titration of therapies, application of advanced monitoring technologies and extensive  interpretation of multiple databases. Critical Care Time devoted to patient care services described in this note independent of APP/resident time (if applicable)  is 31 minutes.   Sherrilyn Rist MD [facility] Personal pager: See Amion If unanswered, please page CCM On-call: [phone]

## 2022-11-07 NOTE — Progress Notes (Signed)
[facility] Progress Note Patient Name: [name] DOB: 02-17-1946 MRN: [id number]   Date of Service  11/07/2022  HPI/Events of Note  Hyponatremia - K+ = 3.2 and Creatinine = 3.74.   eICU Interventions  Will not replace K+ d/t severe renal dysfunction.      Intervention Category Major Interventions: Electrolyte abnormality - evaluation and management  Drinda Belgard Eugene 11/07/2022, 5:16 AM

## 2022-11-08 DIAGNOSIS — R7881 Bacteremia: Secondary | ICD-10-CM

## 2022-11-08 DIAGNOSIS — R6521 Severe sepsis with septic shock: Secondary | ICD-10-CM

## 2022-11-08 DIAGNOSIS — N179 Acute kidney failure, unspecified: Secondary | ICD-10-CM

## 2022-11-08 DIAGNOSIS — A419 Sepsis, unspecified organism: Secondary | ICD-10-CM

## 2022-11-08 LAB — CBC WITH DIFFERENTIAL/PLATELET
Abs Immature Granulocytes: 0.07 10*3/uL (ref 0.00–0.07)
Basophils Absolute: 0 10*3/uL (ref 0.0–0.1)
Basophils Relative: 0 %
Eosinophils Absolute: 0.1 10*3/uL (ref 0.0–0.5)
Eosinophils Relative: 1 %
HCT: 31.6 % — ABNORMAL LOW (ref 39.0–52.0)
Hemoglobin: 10.5 g/dL — ABNORMAL LOW (ref 13.0–17.0)
Immature Granulocytes: 1 %
Lymphocytes Relative: 9 %
Lymphs Abs: 0.5 10*3/uL — ABNORMAL LOW (ref 0.7–4.0)
MCH: 32.7 pg (ref 26.0–34.0)
MCHC: 33.2 g/dL (ref 30.0–36.0)
MCV: 98.4 fL (ref 80.0–100.0)
Monocytes Absolute: 0.4 10*3/uL (ref 0.1–1.0)
Monocytes Relative: 7 %
Neutro Abs: 4.8 10*3/uL (ref 1.7–7.7)
Neutrophils Relative %: 82 %
Platelets: 90 10*3/uL — ABNORMAL LOW (ref 150–400)
RBC: 3.21 MIL/uL — ABNORMAL LOW (ref 4.22–5.81)
RDW: 13 % (ref 11.5–15.5)
WBC: 5.9 10*3/uL (ref 4.0–10.5)
nRBC: 0 % (ref 0.0–0.2)

## 2022-11-08 LAB — BASIC METABOLIC PANEL
Anion gap: 12 (ref 5–15)
BUN: 46 mg/dL — ABNORMAL HIGH (ref 8–23)
CO2: 22 mmol/L (ref 22–32)
Calcium: 9 mg/dL (ref 8.9–10.3)
Chloride: 107 mmol/L (ref 98–111)
Creatinine, Ser: 3.25 mg/dL — ABNORMAL HIGH (ref 0.61–1.24)
GFR, Estimated: 19 mL/min — ABNORMAL LOW (ref >60)
Glucose, Bld: 97 mg/dL (ref 70–99)
Potassium: 3.4 mmol/L — ABNORMAL LOW (ref 3.5–5.1)
Sodium: 141 mmol/L (ref 135–145)

## 2022-11-08 LAB — HEMOGLOBIN A1C
Hgb A1c MFr Bld: 5.9 % — ABNORMAL HIGH (ref 4.8–5.6)
Mean Plasma Glucose: 123 mg/dL

## 2022-11-08 MED ORDER — GABAPENTIN 300 MG PO CAPS
300.0000 mg | ORAL_CAPSULE | Freq: Once | ORAL | Status: AC
  Administered 2022-11-08: 300 mg via ORAL
  Filled 2022-11-08: qty 1

## 2022-11-08 MED ORDER — POTASSIUM CHLORIDE CRYS ER 20 MEQ PO TBCR
20.0000 meq | EXTENDED_RELEASE_TABLET | Freq: Once | ORAL | Status: AC
  Administered 2022-11-08: 20 meq via ORAL
  Filled 2022-11-08: qty 1

## 2022-11-08 MED ORDER — GABAPENTIN 300 MG PO CAPS
600.0000 mg | ORAL_CAPSULE | Freq: Every day | ORAL | Status: DC
  Administered 2022-11-09 – 2022-11-11 (×3): 600 mg via ORAL
  Filled 2022-11-08 (×3): qty 2

## 2022-11-08 MED ORDER — MORPHINE SULFATE (PF) 2 MG/ML IV SOLN
1.0000 mg | INTRAVENOUS | Status: DC | PRN
  Administered 2022-11-10: 1 mg via INTRAVENOUS
  Filled 2022-11-08: qty 1

## 2022-11-08 NOTE — Evaluation (Signed)
Physical Therapy Evaluation Patient Details Name: [name] MRN: [id number] DOB: Sep 17, 1946 Today's Date: 11/08/2022  History of Present Illness  [name] is a 76 y.o. male with ckd3, hx of atrophic right kidney, CAD, hx of bladder ca, prostate ca, and hx of nephrolithiasis was admitted for abdominal pain from diarrhea but upon evaluation at ED, he was found to have left hydronephrosis with pyelonephritis with an obstructive UPJ stone. His labs showed leukocytosis, lactic acidosis of 7.9. He was admitted for management of sepsis and need for nephrostomy.his infectious work up showed that he had serratia bacteremia and +urine culture.he had emergent left nephrostomy tube placed  Clinical Impression  Pt admitted as above and presenting with functional mobility limitations 2* generalized weakness, decreased endurance and mild ambulatory balance deficits.  Pt should progress to dc home with family assist.     Recommendations for follow up therapy are one component of a multi-disciplinary discharge planning process, led by the attending physician.  Recommendations may be updated based on patient status, additional functional criteria and insurance authorization.  Follow Up Recommendations Home health PT      Assistance Recommended at Discharge Intermittent Supervision/Assistance  Patient can return home with the following  A little help with walking and/or transfers;Assistance with cooking/housework;Assist for transportation;Help with stairs or ramp for entrance;A little help with bathing/dressing/bathroom    Equipment Recommendations Rolling walker (2 wheels)  Recommendations for Other Services       Functional Status Assessment Patient has had a recent decline in their functional status and demonstrates the ability to make significant improvements in function in a reasonable and predictable amount of time.     Precautions / Restrictions Precautions Precautions:  Fall Precaution Comments: Drain Restrictions Weight Bearing Restrictions: No      Mobility  Bed Mobility Overal bed mobility: Needs Assistance Bed Mobility: Supine to Sit     Supine to sit: Min assist     General bed mobility comments: Min HHA for trunk control.    Transfers Overall transfer level: Needs assistance Equipment used: Rolling walker (2 wheels) Transfers: Sit to/from Stand Sit to Stand: Min assist, Min guard           General transfer comment: min cues for use of UEs to self assist    Ambulation/Gait Ambulation/Gait assistance: Min assist, Min guard Gait Distance (Feet): 185 Feet Assistive device: Rolling walker (2 wheels) Gait Pattern/deviations: Step-through pattern, Decreased step length - right, Decreased step length - left, Shuffle, Trunk flexed Gait velocity: decr     General Gait Details: cues for posture, position from RW and Chief Operating Officer    Modified Rankin (Stroke Patients Only)       Balance Overall balance assessment: Needs assistance Sitting-balance support: Feet supported, Feet unsupported, No upper extremity supported Sitting balance-Leahy Scale: Good     Standing balance support: During functional activity, Bilateral upper extremity supported, No upper extremity supported Standing balance-Leahy Scale: Fair Standing balance comment: Able to stand without RW to doff underwear over hips.                             Pertinent Vitals/Pain Pain Assessment Pain Assessment: 0-10 Pain Score: 8  Pain Location: headache. Pain Descriptors / Indicators: Aching Pain Intervention(s): Limited activity within patient's tolerance, Monitored during session, Premedicated before session    Home Living Family/patient expects to be discharged  to:: Private residence Living Arrangements: Spouse/significant other Available Help at Discharge: Available 24 hours/day Type of Home: House Home  Access: Stairs to enter Entrance Stairs-Rails: Can reach both;Left;Right Entrance Stairs-Number of Steps: 5 seperated steps which can fit a RW   Home Layout: One level Home Equipment: Grab bars - tub/shower;Shower seat;Cane - single point Additional Comments: Spouse says that she can get any needed DME from their church.    Prior Function Prior Level of Function : Independent/Modified Independent;Driving             Mobility Comments: Independent and active. ADLs Comments: Spouse can assist if needed     Hand Dominance   Dominant Hand: Right    Extremity/Trunk Assessment   Upper Extremity Assessment Upper Extremity Assessment: Overall WFL for tasks assessed    Lower Extremity Assessment Lower Extremity Assessment: Overall WFL for tasks assessed    Cervical / Trunk Assessment Cervical / Trunk Assessment: Normal  Communication   Communication: No difficulties  Cognition Arousal/Alertness: Awake/alert Behavior During Therapy: WFL for tasks assessed/performed Overall Cognitive Status: Within Functional Limits for tasks assessed                                 General Comments: Very motivated.        General Comments      Exercises     Assessment/Plan    PT Assessment Patient needs continued PT services  PT Problem List Decreased activity tolerance;Decreased balance;Decreased mobility;Decreased knowledge of use of DME       PT Treatment Interventions DME instruction;Gait training;Stair training;Functional mobility training;Therapeutic activities;Therapeutic exercise;Patient/family education    PT Goals (Current goals can be found in the Care Plan section)  Acute Rehab PT Goals Patient Stated Goal: Regain IND PT Goal Formulation: With patient Time For Goal Achievement: 11/22/22 Potential to Achieve Goals: Good    Frequency Min 3X/week     Co-evaluation PT/OT/SLP Co-Evaluation/Treatment: Yes   PT goals addressed during session:  Mobility/safety with mobility OT goals addressed during session: ADL's and self-care       AM-PAC PT "6 Clicks" Mobility  Outcome Measure Help needed turning from your back to your side while in a flat bed without using bedrails?: A Little Help needed moving from lying on your back to sitting on the side of a flat bed without using bedrails?: A Little Help needed moving to and from a bed to a chair (including a wheelchair)?: A Little Help needed standing up from a chair using your arms (e.g., wheelchair or bedside chair)?: A Little Help needed to walk in hospital room?: A Little Help needed climbing 3-5 steps with a railing? : A Little 6 Click Score: 18    End of Session Equipment Utilized During Treatment: Gait belt Activity Tolerance: Patient tolerated treatment well Patient left: in chair;with call bell/phone within reach;with family/visitor present Nurse Communication: Mobility status PT Visit Diagnosis: Unsteadiness on feet (R26.81);Difficulty in walking, not elsewhere classified (R26.2)    Time: 1410-1433 PT Time Calculation (min) (ACUTE ONLY): 23 min   Charges:   PT Evaluation $PT Eval Low Complexity: 1 Low          [place] Pager [phone] Office [phone]    Nana Vastine 11/08/2022, 3:23 PM

## 2022-11-08 NOTE — Evaluation (Signed)
Occupational Therapy Evaluation Patient Details Name: [name] MRN: [id number] DOB: [birth year] Today's Date: 11/08/2022   History of Present Illness [name] is a 76 y.o. male with ckd3, hx of atrophic right kidney, CAD, hx of bladder ca, prostate ca, and hx of nephrolithiasis was admitted for abdominal pain from diarrhea but upon evaluation at ED, he was found to have left hydronephrosis with pyelonephritis with an obstructive UPJ stone. His labs showed leukocytosis, lactic acidosis of 7.9. He was admitted for management of sepsis and need for nephrostomy.his infectious work up showed that he had serratia bacteremia and +urine culture.he had emergent left nephrostomy tube placed   Clinical Impression   Patient is currently requiring assistance with ADLs including Min guard assist and safety cues with standing Lower body ADLs, setup assist with Upper body ADLs,  as well as  Minimal assist with bed mobility and min gaurd assist with functional transfers to toilet.  Current level of function is below patient's typical baseline.  During this evaluation, patient was limited by generalized weakness, impaired activity tolerance, and headache, all of which has the potential to impact patient's safety and independence during functional mobility, as well as performance for ADLs.  Patient lives at home in a 1 story town home with 5 separates steps to enter, with his wife who is able to provide 24/7 supervision and assistance.  Patient demonstrates very good rehab potential, and should benefit from continued skilled occupational therapy services while in acute care to maximize safety, independence and quality of life at home.  ?     Recommendations for follow up therapy are one component of a multi-disciplinary discharge planning process, led by the attending physician.  Recommendations may be updated based on patient status, additional functional criteria and insurance authorization.    Follow Up Recommendations  No OT follow up     Assistance Recommended at Discharge PRN  Patient can return home with the following A little help with walking and/or transfers;A little help with bathing/dressing/bathroom    Functional Status Assessment  Patient has had a recent decline in their functional status and demonstrates the ability to make significant improvements in function in a reasonable and predictable amount of time.  Equipment Recommendations   (2 wheeled RW)    Recommendations for Other Services       Precautions / Restrictions Precautions Precautions: Fall Precaution Comments: Drain Restrictions Weight Bearing Restrictions: No      Mobility Bed Mobility Overal bed mobility: Needs Assistance Bed Mobility: Supine to Sit     Supine to sit: Min assist     General bed mobility comments: Min HHA for trunk control.    Transfers                          Balance Overall balance assessment: Needs assistance Sitting-balance support: Feet supported, Feet unsupported, No upper extremity supported Sitting balance-Leahy Scale: Good     Standing balance support: During functional activity, Bilateral upper extremity supported, No upper extremity supported Standing balance-Leahy Scale: Fair Standing balance comment: Able to stand without RW to doff underwear over hips.                           ADL either performed or assessed with clinical judgement   ADL Overall ADL's : Needs assistance/impaired Eating/Feeding: Independent   Grooming: Sitting;Set up;Wash/dry hands   Upper Body Bathing: Set up;Sitting   Lower Body  Bathing: Min guard;Sit to/from stand   Upper Body Dressing : Set up;Sitting Upper Body Dressing Details (indicate cue type and reason): except for lines/drain management Lower Body Dressing: Min guard Lower Body Dressing Details (indicate cue type and reason): Able to demonstrate figure 4 position in recliner. Ablet o  don underwear over feet and stand to pull up with Min guard assist for steadying. Able to doff in standing with cues to sit to remove over feet to prevent falls. Pt compleied. Toilet Transfer: Cueing for safety;Min guard;Ambulation Toilet Transfer Details (indicate cue type and reason): Pt stood from EOB x1 and from recliner x1 to [place] with [employer] and increased effort for each. Cues for hand placement when standing from EOB.  Pt performed room and hallway ambulation with RW, chair follow and Min guard to supervision X`145'  (Please see PT Note for exact distance details. Vitals stable with slight increase in HR but not significant. Toileting- Clothing Manipulation and Hygiene: Min guard;Sitting/lateral lean;Sit to/from stand Toileting - Clothing Manipulation Details (indicate cue type and reason): Pt is incontinent of bladder since prostate ca. Uses brief at home.  Now on external catheter.     Functional mobility during ADLs: Minimal assistance (Min As bed)       Vision   Vision Assessment?: No apparent visual deficits     Perception     Praxis      Pertinent Vitals/Pain Pain Assessment Pain Assessment: 0-10 Pain Score: 8  Pain Location: headache. Pain Descriptors / Indicators: Aching Pain Intervention(s): Limited activity within patient's tolerance, Repositioned, Premedicated before session, Monitored during session     Hand Dominance Right   Extremity/Trunk Assessment Upper Extremity Assessment Upper Extremity Assessment: Overall WFL for tasks assessed   Lower Extremity Assessment Lower Extremity Assessment: Defer to PT evaluation   Cervical / Trunk Assessment Cervical / Trunk Assessment: Normal   Communication Communication Communication: No difficulties   Cognition Arousal/Alertness: Awake/alert Behavior During Therapy: WFL for tasks assessed/performed Overall Cognitive Status: Within Functional Limits for tasks assessed                                  General Comments: Very motivated.     General Comments       Exercises     Shoulder Instructions      Home Living Family/patient expects to be discharged to:: Private residence Living Arrangements: Spouse/significant other Available Help at Discharge: Available 24 hours/day Type of Home: House (townhome) Home Access: Stairs to enter [employer] of Steps: 5 seperated steps which can fit a RW Entrance Stairs-Rails: Can reach both;Left;Right (handrails on both sides halfway up steps.) Home Layout: One level     Bathroom Shower/Tub: Walk-in shower (and a tub/shower which pt prefers.)   Biochemist, clinical: Handicapped height (sink to push)     Home Equipment: Grab bars - tub/shower;Shower seat;Cane - single point   Additional Comments: Spouse says that she can get any needed DME from their church.      Prior Functioning/Environment Prior Level of Function : Independent/Modified Independent;Driving             Mobility Comments: Independent and active. ADLs Comments: Spouse can assist if needed        OT Problem List: Decreased activity tolerance;Impaired balance (sitting and/or standing);Decreased knowledge of use of DME or AE;Cardiopulmonary status limiting activity;Pain      OT Treatment/Interventions: Self-care/ADL training;Therapeutic activities;Energy conservation;Patient/family education;DME and/or AE instruction;Balance  training    OT Goals(Current goals can be found in the care plan section) Acute Rehab OT Goals Patient Stated Goal: Get out of bed, get moving. Return to active lifestyle. OT Goal Formulation: With patient/family Time For Goal Achievement: 11/22/22 Potential to Achieve Goals: Good ADL Goals Pt Will Perform Grooming: standing;with modified independence Pt Will Perform Lower Body Bathing: with modified independence Pt Will Perform Lower Body Dressing: with modified independence Pt Will Transfer to Toilet: with modified  independence;ambulating Pt Will Perform Toileting - Clothing Manipulation and hygiene: with modified independence Additional ADL Goal #1: Pt will engage in 10 min functional standing activities without loss of standing balance, Mod Ind with VSS, in order to demonstrate improved activity tolerance and balance needed to perform ADLs safely at home.  OT Frequency: Min 2X/week    Co-evaluation PT/OT/SLP Co-Evaluation/Treatment: Yes (Partial time)   PT goals addressed during session: Mobility/safety with mobility OT goals addressed during session: ADL's and self-care      AM-PAC OT "6 Clicks" Daily Activity     Outcome Measure Help from another person eating meals?: None Help from another person taking care of personal grooming?: A Little Help from another person toileting, which includes using toliet, bedpan, or urinal?: A Little Help from another person bathing (including washing, rinsing, drying)?: A Little Help from another person to put on and taking off regular upper body clothing?: A Little Help from another person to put on and taking off regular lower body clothing?: A Little 6 Click Score: 19   End of Session Equipment Utilized During Treatment: Gait belt;Rolling walker (2 wheels) Nurse Communication: Mobility status  Activity Tolerance: Patient tolerated treatment well Patient left: in chair;with call bell/phone within reach;with family/visitor present  OT Visit Diagnosis: Unsteadiness on feet (R26.81);Dizziness and giddiness (R42);Pain Pain - part of body:  (8/10 headache after premedication)                Time: 5852-7782 OT Time Calculation (min): 54 min Charges:  OT General Charges $OT Visit: 1 Visit OT Evaluation $OT Eval Moderate Complexity: 1 Mod OT Treatments $Self Care/Home Management : 8-22 mins  Anderson Malta, OT Acute Rehab Services Office: [phone] 11/08/2022  Julien Girt 11/08/2022, 2:43 PM

## 2022-11-08 NOTE — Progress Notes (Signed)
Pharmacy Antibiotic Note  [name] is a 76 y.o. male admitted on 11/04/2022 with bacteremia.  Pharmacy has been consulted for Cefepime dosing.  ID: Sepsis, UTI, Serratia bacteremia -S/p left nephrostomy tube placement - Tmax 99.2, currently afebrile, WBC 5.9 down, Scr 3.25 trending back down, LA 7.9>2.7  Cefepime 12/11 >> Vanc 12/11 >> 12/12  12/12 Ucx (I think this is from nephrostomy tube): 80,000 Serratia Marcescens 12/12 C.diff: negative 12/12 GI panel: neg 12/11 Bcx: Serratia (no resistance) 12/12: MRSA PCr: neg  Plan: - Con't Cefepime 2g IV q24h    Height: '5\' 10"'$  (177.8 cm) Weight: 96.1 kg (211 lb 13.8 oz) IBW/kg (Calculated) : 73  Temp (24hrs), Avg:98.3 F (36.8 C), Min:97.8 F (36.6 C), Max:99.2 F (37.3 C)  Recent Labs  Lab 11/04/22 2047 11/04/22 2250 11/05/22 0639 11/06/22 0249 11/06/22 0715 11/07/22 0306 11/08/22 0249  WBC 21.1*  --  11.8*  --  6.7 5.5 5.9  CREATININE 3.92*  --  4.17* 4.52* 4.38* 3.74* 3.25*  LATICACIDVEN 7.9* 5.8* 2.7*  --   --   --   --     Estimated Creatinine Clearance: 22.5 mL/min (A) (by C-G formula based on SCr of 3.25 mg/dL (H)).    No Known Allergies  [name], PharmD, BCPS Clinical Staff Pharmacist [email or web address]  [name] 11/08/2022 9:17 AM

## 2022-11-08 NOTE — Progress Notes (Signed)
Referring Physician(s): Clark,L/Wrenn,J   Supervising Physician: Corrie Mckusick  Patient Status:  [facility] - In-pt  Chief Complaint:  Urosepsis, left UVJ obstructing stone, hematuria   Subjective: Pt more alert/talkative this afternoon; currently eating; no acute c/o   Allergies: Patient has no known allergies.  Medications: Prior to Admission medications   Medication Sig Start Date End Date Taking? Authorizing Provider  abiraterone acetate (ZYTIGA) 250 MG tablet Take 1,000 mg by mouth daily. Take on an empty stomach   Yes [provider]  acetaminophen (TYLENOL) 500 MG tablet Take 1,000 mg by mouth daily as needed for headache or moderate pain.   Yes [provider]  allopurinol (ZYLOPRIM) 300 MG tablet Take 300 mg by mouth daily.   Yes [provider]  aspirin EC 81 MG tablet Take 81 mg by mouth daily.   Yes [provider]  atorvastatin (LIPITOR) 80 MG tablet Take 40 mg by mouth daily.   Yes [provider]  Cholecalciferol (VITAMIN D3) 25 MCG (1000 UT) CAPS Take 1,000 Units by mouth daily.   Yes [provider]  gabapentin (NEURONTIN) 300 MG capsule Take 600 mg by mouth daily.   Yes [provider]  metoprolol tartrate (LOPRESSOR) 25 MG tablet Take 50 mg by mouth every evening.   Yes [provider]  Multiple Vitamin (MULTIVITAMIN PO) Take 1 tablet by mouth daily.   Yes [provider]  nitroGLYCERIN (NITROSTAT) 0.4 MG SL tablet Place 1 tablet (0.4 mg total) under the tongue every 5 (five) minutes as needed for chest pain. 10/06/13  Yes Plotnikov, Evie Lacks, MD  Omega-3 Fatty Acids (FISH OIL) 1200 MG CAPS Take 1,200 mg by mouth daily.   Yes [provider]  OVER THE COUNTER MEDICATION Take 750 mg by mouth daily. Osteo Bi Flex Glucosamin   Yes [provider]  OVER THE COUNTER MEDICATION Take 1 tablet by mouth daily. Calcium 1200 mg + D3 25 mcg (2000 units)   Yes [provider]  predniSONE (DELTASONE) 5 MG tablet Take 5 mg by mouth daily.   Yes [provider]  traMADol (ULTRAM) 50 MG tablet Take 25 mg by mouth daily as needed for moderate pain. 05/17/22  Yes [provider]  triamcinolone cream (KENALOG) 0.1 % Apply 1 Application topically daily.   Yes [provider]  vitamin C (ASCORBIC ACID) 500 MG tablet Take 500 mg by mouth daily.   Yes [provider]  Zinc 50 MG TABS Take 50 mg by mouth daily in the afternoon.   Yes [provider]  COVID-19 mRNA bivalent vaccine, Pfizer, (PFIZER COVID-19 VAC BIVALENT) injection Inject into the muscle. 09/12/21   Carlyle Basques, MD  ondansetron (ZOFRAN-ODT) 4 MG disintegrating tablet Take 1 tablet (4 mg total) by mouth every 8 (eight) hours as needed for nausea or vomiting. Patient not taking: Reported on 11/05/2022 05/29/22   Davonna Belling, MD     Vital Signs: BP 138/61   Pulse 89   Temp 98.4 F (36.9 C) (Oral)   Resp 20   Ht '5\' 10"'$  (1.778 m)   Wt 211 lb 13.8 oz (96.1 kg)   SpO2 90%   BMI 30.40 kg/m   Physical Exam awake/alert; left PCN intact, insertion site ok, OP 1.9 liters blood tinged urine yesterday, 875 cc today  Imaging: CT ABDOMEN WO CONTRAST  Result Date: 11/06/2022 CLINICAL DATA:  Recently placed nephrostomy tube with hematuria. History of bladder cancer. EXAM: CT ABDOMEN WITHOUT CONTRAST  TECHNIQUE: Multidetector CT imaging of the abdomen was performed following the standard protocol without IV contrast. RADIATION DOSE REDUCTION: This exam was performed according to the departmental dose-optimization program which includes automated exposure control, adjustment of the mA and/or kV according to patient size and/or use of iterative reconstruction technique. COMPARISON:  November 04, 2022 FINDINGS: Lower chest: Small bilateral pleural effusions are new since November 04, 2022. Basilar airspace disease likely volume loss associated with effusions. No  pericardial effusion. Heart is incompletely imaged. No chest wall abnormality. Hepatobiliary: Moderate to marked hepatic steatosis. No pericholecystic stranding or gross biliary duct distension. Pancreas: Pancreas with normal contours, no signs of inflammation or peripancreatic fluid. Spleen: Normal. Adrenals/Urinary Tract: Adrenal glands with small low-density RIGHT adrenal lesion measuring 13 Hounsfield units at 14 x 14 mm (image 19/2). This showed relative washout of 61% on more remote imaging from July 2023 and is compatible with small adrenal adenoma. LEFT adrenal is normal. LEFT kidney with cortical scarring post nephrostomy tube placement. No perinephric hematoma. High density material within the collecting systems and renal pelvis following recent nephrostomy tube placement and injection of contrast into the collecting systems. Persistent ureteral dilation. Nephrostomy tube with marker for sideholes and the pigtail within the renal pelvis of the LEFT kidney entering via posterolateral approach. Obstructing calculus not imaged on this abdominal CT. Marked RIGHT renal cortical atrophy similar to previous imaging. Stomach/Bowel: No acute gastrointestinal process to the extent evaluated on this abdominal CT. Vascular/Lymphatic: Atherosclerotic changes of the abdominal aorta without aneurysmal dilation. There is no gastrohepatic or hepatoduodenal ligament lymphadenopathy. No retroperitoneal or mesenteric lymphadenopathy. Other: No ascites.  No free air. Musculoskeletal: Mild stranding in the body wall at the site of nephrostomy tube insertion. No acute bone finding. No destructive bone process. Spinal degenerative changes. IMPRESSION: 1. Post nephrostomy tube placement, well position pigtail within the renal pelvis as discussed. There is persistent but perhaps slightly improved hydro nephrosis and ureteral dilation. 2. Small bilateral pleural effusions are new since November 04, 2022. 3. Moderate to marked  hepatic steatosis. 4. 14 mm RIGHT adrenal adenoma. No additional dedicated imaging follow-up is recommended for this finding. Based on current clinical literature, biochemical evaluation to exclude possible functioning adrenal nodule is suggested if not already performed. Please refer to current clinical guidelines for detailed recommendations. NEJM 4818:563 154-51 5. Aortic atherosclerosis. Aortic Atherosclerosis (ICD10-I70.0). Electronically Signed   By: Zetta Bills M.D.   On: 11/06/2022 14:29   IR NEPHROSTOMY PLACEMENT LEFT  Result Date: 11/05/2022 INDICATION: 76 year old with urosepsis secondary to an obstructing stone at the left ureterovesical junction. Right kidney is atrophic. EXAM: LEFT PERCUTANEOUS NEPHROSTOMY TUBE PLACEMENT WITH ULTRASOUND AND FLUOROSCOPIC GUIDANCE COMPARISON:  CT 11/04/2022 MEDICATIONS: Antibiotics were given prior to the procedure by the primary care team. ANESTHESIA/SEDATION: Moderate (conscious) sedation was employed during this procedure. A total of Versed 1.0 mg and Fentanyl 25 mcg was administered intravenously by the radiology nurse. Total intra-service moderate Sedation Time: 19 minutes. The patient's level of consciousness and vital signs were monitored continuously by radiology nursing throughout the procedure under my direct supervision. CONTRAST:  10 mL Omnipaque 300-administered into the collecting system(s) FLUOROSCOPY: Radiation Exposure Index (as provided by the fluoroscopic device): 28 mGy Kerma COMPLICATIONS: None immediate. PROCEDURE: Informed written consent was obtained from the patient's wife after a thorough discussion of the procedural risks, benefits and alternatives. All questions were addressed. Maximal Sterile Barrier Technique was utilized including caps, mask, sterile gowns, sterile gloves, sterile drape, hand hygiene and skin antiseptic. A  timeout was performed prior to the initiation of the procedure. Patient was placed prone on the interventional  table with the left side slightly rolled up. Left flank was prepped and draped in sterile fashion. Ultrasound was used to identify the left kidney from an intercostal approach. Dilated mid/upper pole calyx was targeted. Skin was anesthetized with 1% lidocaine and a small incision was made. Using ultrasound guidance, a 21 gauge needle was directed into the dilated collecting system and contrast was injected to confirm placement in the collecting system. 0.018 wire was advanced into the collecting system and Accustick dilator set was placed. Tract was dilated over a J wire and a 10.2 [place] multipurpose drain was advanced over the wire and reconstituted in the renal pelvis. Cloudy purulent-looking yellow fluid was removed from the renal collecting system. Small amount of contrast was injected to confirm placement in the renal pelvis. Nephrostomy tube was attached to a gravity bag and sutured to skin. Fluoroscopic and ultrasound images were taken and saved for documentation. FINDINGS: Moderate left hydronephrosis. It was difficult to visualize the kidney due to overlying ribs. Dilated calyx in the mid/upper pole region was accessed. Left renal collecting system was decompressed at the end of the procedure. IMPRESSION: Successful placement of left percutaneous nephrostomy tube using ultrasound and fluoroscopic guidance. Electronically Signed   By: Markus Daft M.D.   On: 11/05/2022 09:21   CT ABDOMEN PELVIS WO CONTRAST  Result Date: 11/04/2022 CLINICAL DATA:  Abdominal pain, postop EXAM: CT ABDOMEN AND PELVIS WITHOUT CONTRAST TECHNIQUE: Multidetector CT imaging of the abdomen and pelvis was performed following the standard protocol without IV contrast. RADIATION DOSE REDUCTION: This exam was performed according to the departmental dose-optimization program which includes automated exposure control, adjustment of the mA and/or kV according to patient size and/or use of iterative reconstruction technique. COMPARISON:   05/29/2022 FINDINGS: Lower chest: No acute abnormality Hepatobiliary: Diffuse low-density throughout the liver compatible with fatty infiltration. No focal abnormality. Gallbladder unremarkable. Pancreas: No focal abnormality or ductal dilatation. Spleen: No focal abnormality.  Normal size. Adrenals/Urinary Tract: Adrenal glands normal. Severe left hydronephrosis and perinephric stranding due to 4 mm left UVJ stone. Multiple nonobstructing stones in the left kidney. No stones or hydronephrosis on the right. Urinary bladder decompressed, grossly unremarkable. Stomach/Bowel: Sigmoid diverticulosis. No active diverticulitis. Stomach and small bowel decompressed, unremarkable. Vascular/Lymphatic: Aortic atherosclerosis. No evidence of aneurysm or adenopathy. Reproductive: No visible focal abnormality.   Penile implant noted. Other: No free fluid or free air. Musculoskeletal: No acute bony abnormality. IMPRESSION: Severe left hydronephrosis and perinephric stranding due to 4 mm left UVJ stone. Left nephrolithiasis. Hepatic steatosis. Aortic atherosclerosis. Sigmoid diverticulosis. Electronically Signed   By: Rolm Baptise M.D.   On: 11/04/2022 21:57   DG Chest 2 View  Result Date: 11/04/2022 CLINICAL DATA:  Shortness of breath EXAM: CHEST - 2 VIEW COMPARISON:  05/29/2022 FINDINGS: Cardiac shadow is stable. Lungs are well aerated bilaterally. No focal infiltrate or effusion is seen. No bony abnormality is noted. IMPRESSION: No acute abnormality seen. Electronically Signed   By: Inez Catalina M.D.   On: 11/04/2022 21:12    Labs:  CBC: Recent Labs    11/05/22 0639 11/06/22 0715 11/06/22 1640 11/07/22 0306 11/08/22 0249  WBC 11.8* 6.7  --  5.5 5.9  HGB 11.3* 10.4* 9.9* 10.8* 10.5*  HCT 34.0* 30.8* 29.2* 32.5* 31.6*  PLT 96* 65* 68* 71* 90*    COAGS: Recent Labs    11/06/22 1640  INR 1.3*  APTT  52*    BMP: Recent Labs    11/05/22 0639 11/06/22 0249 11/06/22 0715 11/07/22 0306 11/08/22 0249   NA 139  --  138 137 141  K 4.1  --  3.3* 3.2* 3.4*  CL 107  --  109 106 107  CO2 22  --  20* 20* 22  GLUCOSE 114*  --  136* 114* 97  BUN 38*  --  53* 48* 46*  CALCIUM 8.1*  --  8.0* 8.6* 9.0  CREATININE 4.17* 4.52* 4.38* 3.74* 3.25*  GFRNONAA 14* 13* 13* 16* 19*    LIVER FUNCTION TESTS: Recent Labs    05/29/22 0937 11/04/22 2047 11/05/22 0639  BILITOT 1.1 1.3* 1.0  AST 52* 40 48*  ALT '23 14 16  '$ ALKPHOS 67 49 37*  PROT 8.2* 6.9 5.5*  ALBUMIN 4.4 4.1 2.7*    Assessment and Plan: Patient with history of left UVJ stone with obstruction/hydronephrosis, sepsis, hematuria, acute on chronic kidney disease, coronary artery disease, gout, bladder cancer, prostate cancer, functionally solitary left kidney with right renal atrophy; status post left nephrostomy on 11/05/22; afebrile, WBC nl, hgb stable at 10.5, plts 90k(71), creat 3.25(3.74), urine cx- serratia- ID following; latest blood cx neg to date; cont current tx; as per urology patient will need left ureteroscopy once recovered from sepsis and potential internalization to double-J stent when clinical condition improves ; will f/u next week   Electronically Signed: D. Rowe Robert, PA-C 11/08/2022, 4:51 PM   I spent a total of 15 Minutes at the the patient's bedside AND on the patient's hospital floor or unit, greater than 50% of which was counseling/coordinating care for left nephrostomy    Patient ID: [name], male   DOB: 03-22-1946, 76 y.o.   MRN: [id number]

## 2022-11-08 NOTE — Progress Notes (Signed)
[facility]  Date of Admission:  11/04/2022           Reason for visit: Follow up on Serratia bacteremia secondary to urinary source  Current antibiotics: Cefepime 12/11--present  ASSESSMENT:    76 y.o. male admitted with:  #Serratia bacteremia secondary to urinary source with hydronephrosis.  Status post nephrostomy placement 11/05/2022 with interventional radiology.  Urology planning for cystoscopy for stone management 11/15/2022.  #Acute kidney injury: Creatinine seems to be improving.  RECOMMENDATIONS:    Continue cefepime dosed for renal function Will plan for 2 weeks of antibiotic therapy from 12/11 through 12/25.  This will provide an additional 3 days of coverage following his urology procedure on 12/22 Maintain IV cefepime while inpatient and would transition to oral levofloxacin to complete duration of therapy following discharge Dr Linus Salmons is available as needed this weekend and Dr Candiss Norse will be on call at [facility] starting Monday   Principal Problem:   Sepsis ([place]) Active Problems:   AKI (acute kidney injury) ([place])   Malnutrition of moderate degree   Serratia infection   Obstructive pyelonephritis    MEDICATIONS:    Scheduled Meds:  abiraterone acetate  1,000 mg Oral Daily   Chlorhexidine Gluconate Cloth  6 each Topical Daily   docusate sodium  100 mg Oral BID   feeding supplement  237 mL Oral TID BM   gabapentin  300 mg Oral BID   lidocaine  1 patch Transdermal Q24H   oxybutynin  5 mg Oral TID   polyethylene glycol  17 g Oral BID   predniSONE  5 mg Oral Q breakfast   sodium chloride flush  5 mL Intracatheter Q8H   Continuous Infusions:  sodium chloride Stopped (11/05/22 2254)   ceFEPime (MAXIPIME) IV Stopped (11/07/22 2223)   lactated ringers 100 mL/hr at 11/08/22 0938   PRN Meds:.cyclobenzaprine, hydrALAZINE, iohexol, morphine injection, ondansetron (ZOFRAN) IV, mouth rinse, traMADol  SUBJECTIVE:   24 hour events:  No  acute events noted overnight Tmax 99.2 Creatinine improved WBC 5.9, platelets 90, hemoglobin 10.5 Repeat blood cultures 12/14 no growth to date 12/12 urine cultures = Serratia marcescens 12/11 blood cultures = Serratia marcescens   Patient seen at the bedside with family members present.  He reports his biggest complaint this morning is a terrible headache.  They are waiting for gabapentin to be delivered this morning.  He remains on cefepime.  They report urology is planning for cystoscopy on 12/22.  Review of Systems  All other systems reviewed and are negative.     OBJECTIVE:   Blood pressure (!) 112/46, pulse 84, temperature 98.2 F (36.8 C), temperature source Oral, resp. rate 17, height '5\' 10"'$  (1.778 m), weight 96.1 kg, SpO2 94 %. Body mass index is 30.4 kg/m.  Physical Exam Constitutional:      Appearance: Normal appearance.  HENT:     Head: Normocephalic and atraumatic.  Eyes:     Extraocular Movements: Extraocular movements intact.     Conjunctiva/sclera: Conjunctivae normal.  Pulmonary:     Effort: Pulmonary effort is normal. No respiratory distress.  Abdominal:     General: There is no distension.     Palpations: Abdomen is soft.  Musculoskeletal:     Cervical back: Normal range of motion and neck supple.  Neurological:     General: No focal deficit present.     Mental Status: He is alert and oriented to person, place, and time.  Psychiatric:  Mood and Affect: Mood normal.        Behavior: Behavior normal.      Lab Results: Lab Results  Component Value Date   WBC 5.9 11/08/2022   HGB 10.5 (L) 11/08/2022   HCT 31.6 (L) 11/08/2022   MCV 98.4 11/08/2022   PLT 90 (L) 11/08/2022    Lab Results  Component Value Date   NA 141 11/08/2022   K 3.4 (L) 11/08/2022   CO2 22 11/08/2022   GLUCOSE 97 11/08/2022   BUN 46 (H) 11/08/2022   CREATININE 3.25 (H) 11/08/2022   CALCIUM 9.0 11/08/2022   GFRNONAA 19 (L) 11/08/2022   GFRAA 31 (L) 01/06/2018     Lab Results  Component Value Date   ALT 16 11/05/2022   AST 48 (H) 11/05/2022   ALKPHOS 37 (L) 11/05/2022   BILITOT 1.0 11/05/2022    No results found for: "CRP"     Component Value Date/Time   ESRSEDRATE 6 08/09/2011 1129     I have reviewed the micro and lab results in Epic.  Imaging: CT ABDOMEN WO CONTRAST  Result Date: 11/06/2022 CLINICAL DATA:  Recently placed nephrostomy tube with hematuria. History of bladder cancer. EXAM: CT ABDOMEN WITHOUT CONTRAST TECHNIQUE: Multidetector CT imaging of the abdomen was performed following the standard protocol without IV contrast. RADIATION DOSE REDUCTION: This exam was performed according to the departmental dose-optimization program which includes automated exposure control, adjustment of the mA and/or kV according to patient size and/or use of iterative reconstruction technique. COMPARISON:  November 04, 2022 FINDINGS: Lower chest: Small bilateral pleural effusions are new since November 04, 2022. Basilar airspace disease likely volume loss associated with effusions. No pericardial effusion. Heart is incompletely imaged. No chest wall abnormality. Hepatobiliary: Moderate to marked hepatic steatosis. No pericholecystic stranding or gross biliary duct distension. Pancreas: Pancreas with normal contours, no signs of inflammation or peripancreatic fluid. Spleen: Normal. Adrenals/Urinary Tract: Adrenal glands with small low-density RIGHT adrenal lesion measuring 13 Hounsfield units at 14 x 14 mm (image 19/2). This showed relative washout of 61% on more remote imaging from July 2023 and is compatible with small adrenal adenoma. LEFT adrenal is normal. LEFT kidney with cortical scarring post nephrostomy tube placement. No perinephric hematoma. High density material within the collecting systems and renal pelvis following recent nephrostomy tube placement and injection of contrast into the collecting systems. Persistent ureteral dilation. Nephrostomy tube  with marker for sideholes and the pigtail within the renal pelvis of the LEFT kidney entering via posterolateral approach. Obstructing calculus not imaged on this abdominal CT. Marked RIGHT renal cortical atrophy similar to previous imaging. Stomach/Bowel: No acute gastrointestinal process to the extent evaluated on this abdominal CT. Vascular/Lymphatic: Atherosclerotic changes of the abdominal aorta without aneurysmal dilation. There is no gastrohepatic or hepatoduodenal ligament lymphadenopathy. No retroperitoneal or mesenteric lymphadenopathy. Other: No ascites.  No free air. Musculoskeletal: Mild stranding in the body wall at the site of nephrostomy tube insertion. No acute bone finding. No destructive bone process. Spinal degenerative changes. IMPRESSION: 1. Post nephrostomy tube placement, well position pigtail within the renal pelvis as discussed. There is persistent but perhaps slightly improved hydro nephrosis and ureteral dilation. 2. Small bilateral pleural effusions are new since November 04, 2022. 3. Moderate to marked hepatic steatosis. 4. 14 mm RIGHT adrenal adenoma. No additional dedicated imaging follow-up is recommended for this finding. Based on current clinical literature, biochemical evaluation to exclude possible functioning adrenal nodule is suggested if not already performed. Please refer to current clinical  guidelines for detailed recommendations. NEJM 3762:831 [id number] 5. Aortic atherosclerosis. Aortic Atherosclerosis (ICD10-I70.0). Electronically Signed   By: Zetta Bills M.D.   On: 11/06/2022 14:29     Imaging independently reviewed in Epic.    [facility] [phone] pager 11/08/2022, 9:31 AM

## 2022-11-08 NOTE — Progress Notes (Signed)
PROGRESS NOTE  JAYCION TREML  DOB: Nov 25, 1946  PCP: Duffy Rhody, MD NKN:[id number]  DOA: 11/04/2022  LOS: 3 days  Hospital Day: 5  Brief narrative: GAELAN GLENNON is a 76 y.o. male with PMH significant for HTN, HLD, CAD/MI/stents, CKD 3, nephrolithiasis, history of bladder cancer s/p radiation, radiation cystitis, diverticulosis, gout, osteoarthritis, spinal stenosis. 12/11, patient was taken to urgent care for 1 day history of nausea, vomiting, diarrhea increased somnolence and COVID test positive at home.  At the urgent care, he had a temperature 102, tachycardia and hypotension and hence he was sent to the ED. In the ED, he was also noted to have elevated WBC count, lactic acid of 7.9. CT scan showed left hydronephrosis with pyelonephritis with an obstructing UPJ stone. Started on IV antibiotics, IV fluid. He was admitted to ICU for septic shock due to pyelonephritis 12/12, per urology recommendation underwent left nephrostomy tube placement by IR Blood cultures and urine culture sent on admission grew Serratia marcescens Transferred out to [facility] 12/15 See below for details  Subjective: Patient was seen and examined this morning.  Pleasant elderly Caucasian male.  Propped up in bed.  Complains of headache this morning gradually improving after he received gabapentin.  Wife and brother at bedside. Wife states that he had diarrhea at home but since admission, he has not had any bowel movement. Chart reviewed. In the last 24 hours, afebrile, heart rate 80s, blood pressure 140s to 160s mostly, low in 110s this morning On 4 L oxygen overnight  Assessment and plan: Septic shock POA Left pyelonephritis Left hydronephrosis due to obstructing UVJ stone s/p left nephrostomy IR 12/12 UTI and bacteremia with Serratia marcescens Currently on IV cefepime per ID.  Continue plan of Levaquin for 2 weeks course at discharge No fever.  WBC count normalized.  Hemodynamically stable.   Repeat lactic acid level tomorrow Patient will ultimately need internal stent after resolution of sepsis.  Per family, urology plans the procedure next week on 12/22 For pain management, patient is currently on oxybutynin 5 mg 3 times daily, Ultram and morphine as needed Recent Labs  Lab 11/04/22 2047 11/04/22 2250 11/05/22 0639 11/06/22 0715 11/07/22 0306 11/08/22 0249  WBC 21.1*  --  11.8* 6.7 5.5 5.9  LATICACIDVEN 7.9* 5.8* 2.7*  --   --   --     AKI on CKD3a Baseline creatinine 1.3 from July 2023.  Presented with an elevated creatinine which peaked at 4.52.  Gradually improving with IV hydration.  Continue to monitor Recent Labs    05/29/22 0923 11/04/22 2047 11/05/22 0639 11/06/22 0249 11/06/22 0715 11/07/22 0306 11/08/22 0249  BUN 18 35* 38*  --  53* 48* 46*  CREATININE 1.30* 3.92* 4.17* 4.52* 4.38* 3.74* 3.25*   Hypokalemia/hypomagnesemia Ongoing monitoring and replacement.  Potassium low at 3.4 this morning for replacement given. Recent Labs  Lab 11/04/22 2047 11/05/22 0639 11/06/22 0715 11/07/22 0306 11/08/22 0249  K 3.7 4.1 3.3* 3.2* 3.4*  MG  --  1.3* 1.7 2.1  --   PHOS  --  4.2  --   --   --    Hyperglycemia A1c pending.  No history of diabetes Blood sugar trend as below.  Continue sliding scale insulin with Accu-Cheks Lab Results  Component Value Date   HGBA1C 5.8 08/09/2011   Recent Labs  Lab 11/05/22 0137 11/05/22 1200  GLUCAP 127* 104*   Diarrhea/constipation History of diverticulosis Per wife, patient had diarrhea at home prior to presentation  but since admission, he has not had any bowel movement.  Currently on scheduled stool softeners and laxative.    History of bladder cancer s/p radiation  Mild hematuria due to radiation cystitis Wife noticed mild hematuria yesterday.  Currently nephrostomy output looks strawberry red but the urethral catheter does not show any blood.  Currently aspirin on hold.  Not on heparin for DVT  prophylaxis Continue home meds including Zytiga Mild measured about hemoglobin stable. Recent Labs    11/05/22 0639 11/06/22 0715 11/06/22 1640 11/07/22 0306 11/08/22 0249  HGB 11.3* 10.4* 9.9* 10.8* 10.5*  MCV 99.1 98.1  --  98.5 98.4   History of hypertension PTA on Lopressor 50 mg nightly Currently BP meds on hold because of septic shock.   CAD/MI/stents HLD PTA on aspirin 81 mg daily, Lipitor 80 mg daily Currently aspirin on hold  Osteoarthritis Spinal stenosis History of gout PTA on allopurinol, gabapentin 600 mg daily Gabapentin resumed  Headache this morning Improving with gabapentin.  Continue to monitor    Goals of care   Code Status: DNR    Mobility: Encourage ambulation.  PT eval pending  Scheduled Meds:  abiraterone acetate  1,000 mg Oral Daily   Chlorhexidine Gluconate Cloth  6 each Topical Daily   docusate sodium  100 mg Oral BID   feeding supplement  237 mL Oral TID BM   gabapentin  300 mg Oral Once   [START ON 11/09/2022] gabapentin  600 mg Oral Daily   lidocaine  1 patch Transdermal Q24H   oxybutynin  5 mg Oral TID   polyethylene glycol  17 g Oral BID   predniSONE  5 mg Oral Q breakfast   sodium chloride flush  5 mL Intracatheter Q8H    PRN meds: cyclobenzaprine, hydrALAZINE, iohexol, morphine injection, ondansetron (ZOFRAN) IV, mouth rinse, traMADol   Infusions:   sodium chloride Stopped (11/05/22 2254)   ceFEPime (MAXIPIME) IV Stopped (11/07/22 2223)   lactated ringers 100 mL/hr at 11/08/22 8657    Skin assessment:     Nutritional status:  Body mass index is 30.4 kg/m.  Nutrition Problem: Moderate Malnutrition Etiology: chronic illness (CKD, CAD, bladder cancer, prostate adenocarcinoma) Signs/Symptoms: moderate fat depletion, moderate muscle depletion     Diet:  Diet Order             Diet regular Room service appropriate? Yes; Fluid consistency: Thin  Diet effective now                   DVT prophylaxis:   SCDs Start: 11/05/22 0050   Antimicrobials: IV cefepime Fluid: Currently on LR at 100 mill per hour Consultants: ID, PCCM, urology Family Communication: Wife and brother at bedside  Status is: Inpatient  Continue in-hospital care because: Improving renal function, remains on IV antibiotics. Level of care: Progressive can transfer to progressive level today  Dispo: The patient is from: Home              Anticipated d/c is to: PT eval, pending clinical course              Patient currently is not medically stable to d/c.   Difficult to place patient No    Antimicrobials: Anti-infectives (From admission, onward)    Start     Dose/Rate Route Frequency Ordered Stop   11/06/22 2200  vancomycin (VANCOREADY) IVPB 1250 mg/250 mL  Status:  Discontinued        1,250 mg 166.7 mL/hr over 90 Minutes Intravenous Every 48  hours 11/05/22 0054 11/05/22 2223   11/05/22 2200  ceFEPIme (MAXIPIME) 2 g in sodium chloride 0.9 % 100 mL IVPB        2 g 200 mL/hr over 30 Minutes Intravenous Every 24 hours 11/05/22 0054     11/04/22 2130  vancomycin (VANCOCIN) IVPB 1000 mg/200 mL premix        1,000 mg 200 mL/hr over 60 Minutes Intravenous Every hour 11/04/22 2046 11/04/22 2347   11/04/22 2100  ceFEPIme (MAXIPIME) 2 g in sodium chloride 0.9 % 100 mL IVPB        2 g 200 mL/hr over 30 Minutes Intravenous  Once 11/04/22 2046 11/04/22 2150       Objective: Vitals:   11/08/22 0600 11/08/22 0800  BP: (!) 112/46   Pulse: 84   Resp: 17   Temp:  98.2 F (36.8 C)  SpO2: 94%     Intake/Output Summary (Last 24 hours) at 11/08/2022 1106 Last data filed at 11/08/2022 3646 Gross per 24 hour  Intake 1978.63 ml  Output 3225 ml  Net -1246.37 ml   Filed Weights   11/05/22 0435 11/06/22 0500 11/07/22 0500  Weight: 94.8 kg 96.1 kg 96.1 kg   Weight change:  Body mass index is 30.4 kg/m.   Physical Exam: General exam: Pleasant, elderly Caucasian male.  Mild headache Skin: No rashes, lesions or  ulcers. HEENT: Atraumatic, normocephalic, no obvious bleeding Lungs: Clear to auscultation bilaterally CVS: Regular rate and rhythm, no murmur GI/Abd soft, distended belly, mild tenderness probably because of constipation.  Bowel sound present CNS: Alert, awake, oriented x 3 Psychiatry: Mood appropriate Extremities: No pedal edema, no calf tenderness  Data Review: I have personally reviewed the laboratory data and studies available.  F/u labs ordered Unresulted Labs (From admission, onward)     Start     Ordered   11/09/22 8032  Basic metabolic panel  Tomorrow morning,   R       Question:  Specimen collection method  Answer:  Lab=Lab collect   11/08/22 0835   11/09/22 0500  CBC with Differential/Platelet  Tomorrow morning,   R       Question:  Specimen collection method  Answer:  Lab=Lab collect   11/08/22 0835   11/09/22 0500  Lactic acid, plasma  Tomorrow morning,   R       Question:  Specimen collection method  Answer:  Lab=Lab collect   11/08/22 0835   11/08/22 0839  Hemoglobin A1c  Add-on,   AD       Question:  Specimen collection method  Answer:  Lab=Lab collect   11/08/22 0838            Total time spent in review of labs and imaging, patient evaluation, formulation of plan, documentation and communication with family -16 minutes  Signed, Terrilee Croak, MD [facility] 11/08/2022

## 2022-11-09 ENCOUNTER — Inpatient Hospital Stay (HOSPITAL_COMMUNITY): Payer: Medicare PPO

## 2022-11-09 LAB — CULTURE, BLOOD (ROUTINE X 2): Special Requests: ADEQUATE

## 2022-11-09 LAB — CBC WITH DIFFERENTIAL/PLATELET
Abs Immature Granulocytes: 0.27 K/uL — ABNORMAL HIGH (ref 0.00–0.07)
Basophils Absolute: 0.1 K/uL (ref 0.0–0.1)
Basophils Relative: 1 %
Eosinophils Absolute: 0.1 K/uL (ref 0.0–0.5)
Eosinophils Relative: 2 %
HCT: 30.7 % — ABNORMAL LOW (ref 39.0–52.0)
Hemoglobin: 10 g/dL — ABNORMAL LOW (ref 13.0–17.0)
Immature Granulocytes: 4 %
Lymphocytes Relative: 9 %
Lymphs Abs: 0.6 K/uL — ABNORMAL LOW (ref 0.7–4.0)
MCH: 32.4 pg (ref 26.0–34.0)
MCHC: 32.6 g/dL (ref 30.0–36.0)
MCV: 99.4 fL (ref 80.0–100.0)
Monocytes Absolute: 0.7 K/uL (ref 0.1–1.0)
Monocytes Relative: 9 %
Neutro Abs: 5.6 K/uL (ref 1.7–7.7)
Neutrophils Relative %: 75 %
Platelets: 109 K/uL — ABNORMAL LOW (ref 150–400)
RBC: 3.09 MIL/uL — ABNORMAL LOW (ref 4.22–5.81)
RDW: 13.3 % (ref 11.5–15.5)
WBC: 7.3 K/uL (ref 4.0–10.5)
nRBC: 0 % (ref 0.0–0.2)

## 2022-11-09 LAB — BASIC METABOLIC PANEL
Anion gap: 12 (ref 5–15)
BUN: 47 mg/dL — ABNORMAL HIGH (ref 8–23)
CO2: 27 mmol/L (ref 22–32)
Calcium: 8.9 mg/dL (ref 8.9–10.3)
Chloride: 99 mmol/L (ref 98–111)
Creatinine, Ser: 2.92 mg/dL — ABNORMAL HIGH (ref 0.61–1.24)
GFR, Estimated: 22 mL/min — ABNORMAL LOW (ref >60)
Glucose, Bld: 108 mg/dL — ABNORMAL HIGH (ref 70–99)
Potassium: 3.1 mmol/L — ABNORMAL LOW (ref 3.5–5.1)
Sodium: 138 mmol/L (ref 135–145)

## 2022-11-09 LAB — LACTIC ACID, PLASMA: Lactic Acid, Venous: 1.2 mmol/L (ref 0.5–1.9)

## 2022-11-09 MED ORDER — ACETAMINOPHEN 500 MG PO TABS
500.0000 mg | ORAL_TABLET | Freq: Four times a day (QID) | ORAL | Status: DC
  Administered 2022-11-09 – 2022-11-11 (×7): 500 mg via ORAL
  Filled 2022-11-09 (×7): qty 1

## 2022-11-09 MED ORDER — CARVEDILOL 3.125 MG PO TABS
3.1250 mg | ORAL_TABLET | Freq: Two times a day (BID) | ORAL | Status: DC
  Administered 2022-11-09 – 2022-11-11 (×5): 3.125 mg via ORAL
  Filled 2022-11-09 (×5): qty 1

## 2022-11-09 MED ORDER — POTASSIUM CHLORIDE CRYS ER 20 MEQ PO TBCR: 20.0000 meq | EXTENDED_RELEASE_TABLET | Freq: Once | ORAL | Status: DC

## 2022-11-09 MED ORDER — AMLODIPINE BESYLATE 5 MG PO TABS
5.0000 mg | ORAL_TABLET | Freq: Every day | ORAL | Status: DC
  Administered 2022-11-09 – 2022-11-11 (×3): 5 mg via ORAL
  Filled 2022-11-09 (×3): qty 1

## 2022-11-09 MED ORDER — POTASSIUM CHLORIDE 20 MEQ PO PACK
20.0000 meq | PACK | Freq: Once | ORAL | Status: AC
  Administered 2022-11-09: 20 meq via ORAL
  Filled 2022-11-09: qty 1

## 2022-11-09 NOTE — Progress Notes (Addendum)
PROGRESS NOTE  [name]  DOB: Nov 23, 1946  PCP: Duffy Rhody, MD PJK:[id number]  DOA: 11/04/2022  LOS: 4 days  Hospital Day: 6  Brief narrative: [name] is a 76 y.o. male with PMH significant for HTN, HLD, CAD/MI/stents, CKD 3, nephrolithiasis, history of bladder cancer s/p radiation, radiation cystitis, diverticulosis, gout, osteoarthritis, spinal stenosis. 12/11, patient was taken to urgent care for 1 day history of nausea, vomiting, diarrhea increased somnolence and COVID test positive at home.  At the urgent care, he had a temperature 102, tachycardia and hypotension and hence he was sent to the ED. In the ED, he was also noted to have elevated WBC count, lactic acid of 7.9. CT scan showed left hydronephrosis with pyelonephritis with an obstructing UPJ stone. Started on IV antibiotics, IV fluid. He was admitted to ICU for septic shock due to pyelonephritis 12/12, per urology recommendation underwent left nephrostomy tube placement by IR Blood cultures and urine culture sent on admission grew Serratia marcescens Transferred out to [facility] 12/15 See below for details  Subjective: Patient was seen and examined this morning.   Propped up in bed.  Somnolent this morning.  Trying to catch up sleep from last night per wife at bedside.   No bowel movement despite MiraLAX yesterday.   Soapsuds enema ordered. Labs from this morning with improving renal function.  Assessment and plan: Septic shock POA Left pyelonephritis Left hydronephrosis due to obstructing UVJ stone s/p left nephrostomy IR 12/12 UTI and bacteremia with Serratia marcescens Currently on IV cefepime per ID.  Continue plan of Levaquin for 2 weeks course at discharge No fever.  WBC count normalized.  Hemodynamically stable.  Repeat lactic acid level normal today. Patient will ultimately need internal stent after resolution of sepsis.  Per family, urology plans the procedure next week on 12/22 For pain  management, patient is currently on oxybutynin 5 mg 3 times daily, Ultram and morphine as needed. Recent Labs  Lab 11/04/22 2047 11/04/22 2250 11/05/22 0639 11/06/22 0715 11/07/22 0306 11/08/22 0249 11/09/22 0303 11/09/22 0307  WBC 21.1*  --  11.8* 6.7 5.5 5.9  --  7.3  LATICACIDVEN 7.9* 5.8* 2.7*  --   --   --  1.2  --     Posterior headache Ongoing for at least the last 24 hours.  Did not improve with gabapentin resumption yesterday.   Obtain CT head today. Continue as needed pain meds   AKI on CKD3a Baseline creatinine 1.3 from July 2023.  Presented with an elevated creatinine which peaked at 4.52.  Gradually improving with IV hydration.  2.92 today.  Continue to monitor Recent Labs    05/29/22 0923 11/04/22 2047 11/05/22 0639 11/06/22 0249 11/06/22 0715 11/07/22 0306 11/08/22 0249 11/09/22 0307  BUN 18 35* 38*  --  53* 48* 46* 47*  CREATININE 1.30* 3.92* 4.17* 4.52* 4.38* 3.74* 3.25* 2.92*   Hypokalemia/hypomagnesemia Ongoing monitoring and replacement.  Potassium low at 3.1 this morning.  Replacement given Recent Labs  Lab 11/05/22 0639 11/06/22 0715 11/07/22 0306 11/08/22 0249 11/09/22 0307  K 4.1 3.3* 3.2* 3.4* 3.1*  MG 1.3* 1.7 2.1  --   --   PHOS 4.2  --   --   --   --    Hyperglycemia A1c 5.9.  No history of diabetes Blood sugar trend as below.  Can stop monitoring blood sugar level.  Diarrhea/constipation History of diverticulosis Per wife, patient had diarrhea at home prior to presentation but since admission, he  has not had any bowel movement.  Currently on scheduled stool softeners and laxative.  Patient has not had a bowel movement last 5 days.  Noted to have abdominal distention.  1 dose of enema ordered today.   History of bladder cancer s/p radiation  Mild hematuria due to radiation cystitis Wife noticed mild hematuria 2 days ago.  Nephrostomy output looked strawberry red yesterday but the urethral catheter does not show any blood.  Currently  aspirin on hold.  Not on heparin for DVT prophylaxis Continue home meds including Zytiga Mild measured about hemoglobin stable. Recent Labs    11/06/22 0715 11/06/22 1640 11/07/22 0306 11/08/22 0249 11/09/22 0307  HGB 10.4* 9.9* 10.8* 10.5* 10.0*  MCV 98.1  --  98.5 98.4 99.4   History of hypertension PTA on Lopressor 50 mg nightly Currently BP meds on hold because of septic shock.   CAD/MI/stents HLD PTA on aspirin 81 mg daily, Lipitor 80 mg daily Currently aspirin on hold  Osteoarthritis Spinal stenosis History of gout PTA on allopurinol, gabapentin 600 mg daily Gabapentin resumed  Goals of care   Code Status: DNR    Mobility: Encourage ambulation.  PT eval obtained.  Home with PT recommended  Scheduled Meds:  abiraterone acetate  1,000 mg Oral Daily   acetaminophen  500 mg Oral Q6H   amLODipine  5 mg Oral Daily   carvedilol  3.125 mg Oral BID WC   Chlorhexidine Gluconate Cloth  6 each Topical Daily   docusate sodium  100 mg Oral BID   feeding supplement  237 mL Oral TID BM   gabapentin  600 mg Oral Daily   lidocaine  1 patch Transdermal Q24H   oxybutynin  5 mg Oral TID   polyethylene glycol  17 g Oral BID   predniSONE  5 mg Oral Q breakfast   sodium chloride flush  5 mL Intracatheter Q8H    PRN meds: cyclobenzaprine, hydrALAZINE, iohexol, morphine injection, ondansetron (ZOFRAN) IV, mouth rinse, traMADol   Infusions:   sodium chloride Stopped (11/05/22 2254)   ceFEPime (MAXIPIME) IV Stopped (11/08/22 2152)   lactated ringers 125 mL/hr at 11/09/22 1028    Skin assessment:     Nutritional status:  Body mass index is 30.4 kg/m.  Nutrition Problem: Moderate Malnutrition Etiology: chronic illness (CKD, CAD, bladder cancer, prostate adenocarcinoma) Signs/Symptoms: moderate fat depletion, moderate muscle depletion     Diet:  Diet Order             Diet regular Room service appropriate? Yes; Fluid consistency: Thin  Diet effective now                    DVT prophylaxis:  SCDs Start: 11/05/22 0050   Antimicrobials: IV cefepime Fluid: Currently on LR at 125 mill per hour Consultants: ID, PCCM, urology Family Communication: Wife at bedside.  Status is: Inpatient  Continue in-hospital care because: Improving renal function, remains on IV antibiotics, IV fluid. Level of care: Progressive   Dispo: The patient is from: Home              Anticipated d/c is to: Home with PT in next 1-2 days.              Patient currently is not medically stable to d/c.   Difficult to place patient No    Antimicrobials: Anti-infectives (From admission, onward)    Start     Dose/Rate Route Frequency Ordered Stop   11/06/22 2200  vancomycin (VANCOREADY) IVPB  1250 mg/250 mL  Status:  Discontinued        1,250 mg 166.7 mL/hr over 90 Minutes Intravenous Every 48 hours 11/05/22 0054 11/05/22 2223   11/05/22 2200  ceFEPIme (MAXIPIME) 2 g in sodium chloride 0.9 % 100 mL IVPB        2 g 200 mL/hr over 30 Minutes Intravenous Every 24 hours 11/05/22 0054     11/04/22 2130  vancomycin (VANCOCIN) IVPB 1000 mg/200 mL premix        1,000 mg 200 mL/hr over 60 Minutes Intravenous Every hour 11/04/22 2046 11/04/22 2347   11/04/22 2100  ceFEPIme (MAXIPIME) 2 g in sodium chloride 0.9 % 100 mL IVPB        2 g 200 mL/hr over 30 Minutes Intravenous  Once 11/04/22 2046 11/04/22 2150       Objective: Vitals:   11/09/22 0600 11/09/22 0845  BP: (!) 159/77   Pulse: 84   Resp: 17   Temp:  98.3 F (36.8 C)  SpO2: 91%     Intake/Output Summary (Last 24 hours) at 11/09/2022 1133 Last data filed at 11/09/2022 0734 Gross per 24 hour  Intake 2824.15 ml  Output 4150 ml  Net -1325.85 ml   Filed Weights   11/05/22 0435 11/06/22 0500 11/07/22 0500  Weight: 94.8 kg 96.1 kg 96.1 kg   Weight change:  Body mass index is 30.4 kg/m.   Physical Exam: General exam: Pleasant, elderly Caucasian male.  Continues to have mild headache Skin: No rashes,  lesions or ulcers. HEENT: Atraumatic, normocephalic, no obvious bleeding Lungs: Clear to auscultation bilaterally CVS: Regular rate and rhythm, no murmur GI/Abd soft, distended abdomen, mild tenderness probably because of constipation.  Bowel sound present CNS: Somnolent but opens eyes on verbal command.  Able to answer orientation questions and follow commands. Psychiatry: Mood appropriate Extremities: No pedal edema, no calf tenderness  Data Review: I have personally reviewed the laboratory data and studies available.  F/u labs ordered Unresulted Labs (From admission, onward)    None       Total time spent in review of labs and imaging, patient evaluation, formulation of plan, documentation and communication with family -74 minutes  Signed, Bryan Croak, MD [facility] 11/09/2022

## 2022-11-10 LAB — BASIC METABOLIC PANEL
Anion gap: 10 (ref 5–15)
BUN: 44 mg/dL — ABNORMAL HIGH (ref 8–23)
CO2: 28 mmol/L (ref 22–32)
Calcium: 8.8 mg/dL — ABNORMAL LOW (ref 8.9–10.3)
Chloride: 102 mmol/L (ref 98–111)
Creatinine, Ser: 2.64 mg/dL — ABNORMAL HIGH (ref 0.61–1.24)
GFR, Estimated: 24 mL/min — ABNORMAL LOW (ref >60)
Glucose, Bld: 125 mg/dL — ABNORMAL HIGH (ref 70–99)
Potassium: 3 mmol/L — ABNORMAL LOW (ref 3.5–5.1)
Sodium: 140 mmol/L (ref 135–145)

## 2022-11-10 MED ORDER — POTASSIUM CHLORIDE CRYS ER 20 MEQ PO TBCR
40.0000 meq | EXTENDED_RELEASE_TABLET | ORAL | Status: AC
  Administered 2022-11-10 (×2): 40 meq via ORAL
  Filled 2022-11-10 (×2): qty 2

## 2022-11-10 NOTE — Progress Notes (Signed)
Physical Therapy Treatment Patient Details Name: [name] MRN: [id number] DOB: 25-May-1946 Today's Date: 11/10/2022   History of Present Illness [name] is a 76 y.o. male with ckd3, hx of atrophic right kidney, CAD, hx of bladder ca, prostate ca, and hx of nephrolithiasis was admitted for abdominal pain from diarrhea but upon evaluation at ED, he was found to have left hydronephrosis with pyelonephritis with an obstructive UPJ stone. His labs showed leukocytosis, lactic acidosis of 7.9. He was admitted for management of sepsis and need for nephrostomy.his infectious work up showed that he had serratia bacteremia and +urine culture.he had emergent left nephrostomy tube placed    PT Comments    Pt continues very cooperative and eager to mobilize but fatigues fairly easily and requires increased time and standing rest breaks for task completion.  Pt also with c/o headache pain and "wooziness" - BP with ambulation 100/58.  Pt and spouse hopeful for dc home soon.  Recommendations for follow up therapy are one component of a multi-disciplinary discharge planning process, led by the attending physician.  Recommendations may be updated based on patient status, additional functional criteria and insurance authorization.  Follow Up Recommendations  Home health PT     Assistance Recommended at Discharge Intermittent Supervision/Assistance  Patient can return home with the following A little help with walking and/or transfers;Assistance with cooking/housework;Assist for transportation;Help with stairs or ramp for entrance;A little help with bathing/dressing/bathroom   Equipment Recommendations  Rolling walker (2 wheels)    Recommendations for Other Services       Precautions / Restrictions Precautions Precautions: Fall Precaution Comments: Drain Restrictions Weight Bearing Restrictions: No     Mobility  Bed Mobility Overal bed mobility: Needs Assistance Bed Mobility:  Supine to Sit     Supine to sit: Min guard, HOB elevated     General bed mobility comments: use of bed rail    Transfers Overall transfer level: Needs assistance Equipment used: Rolling walker (2 wheels) Transfers: Sit to/from Stand Sit to Stand: Min guard           General transfer comment: min cues for use of UEs to self assist    Ambulation/Gait Ambulation/Gait assistance: Min guard Gait Distance (Feet): 185 Feet Assistive device: Rolling walker (2 wheels) Gait Pattern/deviations: Step-through pattern, Decreased step length - right, Decreased step length - left, Shuffle, Trunk flexed Gait velocity: decr     General Gait Details: cues for posture, position from RW and pace; multiple standing rest breaks for task completion   Stairs             Wheelchair Mobility    Modified Rankin (Stroke Patients Only)       Balance Overall balance assessment: Needs assistance Sitting-balance support: Feet supported, Feet unsupported, No upper extremity supported Sitting balance-Leahy Scale: Good     Standing balance support: During functional activity, Bilateral upper extremity supported, No upper extremity supported Standing balance-Leahy Scale: Fair                              Cognition Arousal/Alertness: Awake/alert Behavior During Therapy: WFL for tasks assessed/performed Overall Cognitive Status: Within Functional Limits for tasks assessed                                 General Comments: Very motivated.        Exercises  General Comments        Pertinent Vitals/Pain Pain Assessment Pain Assessment: 0-10 Pain Score: 7  Pain Location: headache. Pain Descriptors / Indicators: Aching Pain Intervention(s): Limited activity within patient's tolerance, Monitored during session    Home Living                          Prior Function            PT Goals (current goals can now be found in the care plan  section) Acute Rehab PT Goals Patient Stated Goal: Regain IND PT Goal Formulation: With patient Time For Goal Achievement: 11/22/22 Potential to Achieve Goals: Good Progress towards PT goals: Progressing toward goals    Frequency    Min 3X/week      PT Plan Current plan remains appropriate    Co-evaluation              AM-PAC PT "6 Clicks" Mobility   Outcome Measure  Help needed turning from your back to your side while in a flat bed without using bedrails?: A Little Help needed moving from lying on your back to sitting on the side of a flat bed without using bedrails?: A Little Help needed moving to and from a bed to a chair (including a wheelchair)?: A Little Help needed standing up from a chair using your arms (e.g., wheelchair or bedside chair)?: A Little Help needed to walk in hospital room?: A Little Help needed climbing 3-5 steps with a railing? : A Little 6 Click Score: 18    End of Session Equipment Utilized During Treatment: Gait belt Activity Tolerance: Patient tolerated treatment well Patient left: in chair;with call bell/phone within reach;with family/visitor present Nurse Communication: Mobility status PT Visit Diagnosis: Unsteadiness on feet (R26.81);Difficulty in walking, not elsewhere classified (R26.2)     Time: 5784-6962 PT Time Calculation (min) (ACUTE ONLY): 37 min  Charges:  $Gait Training: 8-22 mins $Therapeutic Activity: 8-22 mins                     Debe Coder PT Acute Rehabilitation Services Pager [phone] Office [phone]    Encarnacion Scioneaux 11/10/2022, 12:40 PM

## 2022-11-10 NOTE — Progress Notes (Signed)
PROGRESS NOTE  ZANDEN COLVER  DOB: 19-May-1946  PCP: Duffy Rhody, MD NGE:[id number]  DOA: 11/04/2022  LOS: 5 days  Hospital Day: 7  Brief narrative: [name] is a 76 y.o. male with PMH significant for HTN, HLD, CAD/MI/stents, CKD 3, nephrolithiasis, history of bladder cancer s/p radiation, radiation cystitis, diverticulosis, gout, osteoarthritis, spinal stenosis. 12/11, patient was taken to urgent care for 1 day history of nausea, vomiting, diarrhea increased somnolence and COVID test positive at home.  At the urgent care, he had a temperature 102, tachycardia and hypotension and hence he was sent to the ED. In the ED, he was also noted to have elevated WBC count, lactic acid of 7.9. CT scan showed left hydronephrosis with pyelonephritis with an obstructing UPJ stone. Started on IV antibiotics, IV fluid. He was admitted to ICU for septic shock due to pyelonephritis 12/12, per urology recommendation underwent left nephrostomy tube placement by IR Blood cultures and urine culture sent on admission grew Serratia marcescens Transferred out to [facility] 12/15 See below for details  Subjective: Patient was seen and examined this morning.   Lying on bed.  Headache not improving. Had a BM yesterday. Labs shows improving creatinine Wife at bedside.  Assessment and plan: Septic shock POA Left pyelonephritis Left hydronephrosis due to obstructing UVJ stone s/p left nephrostomy IR 12/12 UTI and bacteremia with Serratia marcescens Currently on IV cefepime per ID.  Continue plan of Levaquin for 2 weeks course at discharge. No fever.  WBC count, lactic acid level normalized. Patient will ultimately need internal stent after resolution of sepsis.  Per family, urology plans the procedure next week on 12/22 For pain management, patient is currently on oxybutynin 5 mg 3 times daily, Ultram and morphine as needed. Recent Labs  Lab 11/04/22 2047 11/04/22 2250 11/05/22 0639  11/06/22 0715 11/07/22 0306 11/08/22 0249 11/09/22 0303 11/09/22 0307  WBC 21.1*  --  11.8* 6.7 5.5 5.9  --  7.3  LATICACIDVEN 7.9* 5.8* 2.7*  --   --   --  1.2  --     Posterior headache Ongoing for at least the last 24 hours.  Did not improve with gabapentin resumption yesterday.   12/16, CT head unremarkable. Continue as needed pain meds.  Also on Flexeril.   AKI on CKD3a Baseline creatinine 1.3 from July 2023.  Presented with an elevated creatinine which peaked at 4.52.  Gradually improving with IV hydration.  2.64 today.  Continue to monitor Recent Labs    05/29/22 0923 11/04/22 2047 11/05/22 0639 11/06/22 0249 11/06/22 0715 11/07/22 0306 11/08/22 0249 11/09/22 0307 11/10/22 0823  BUN 18 35* 38*  --  53* 48* 46* 47* 44*  CREATININE 1.30* 3.92* 4.17* 4.52* 4.38* 3.74* 3.25* 2.92* 2.64*   Hypokalemia/hypomagnesemia Ongoing monitoring and replacement.  Potassium low at 3.0 this morning.  Replacement given Recent Labs  Lab 11/05/22 0639 11/06/22 0715 11/07/22 0306 11/08/22 0249 11/09/22 0307 11/10/22 0823  K 4.1 3.3* 3.2* 3.4* 3.1* 3.0*  MG 1.3* 1.7 2.1  --   --   --   PHOS 4.2  --   --   --   --   --   Diarrhea/constipation History of diverticulosis Per wife, patient had diarrhea at home prior to presentation but since admission, he did not have bowel movement for 5 days.  He was placed on scheduled stool softeners and laxative.  Finally had a BM on 12/16   History of bladder cancer s/p radiation  Mild hematuria  due to radiation cystitis Wife noticed mild hematuria 2 days ago.  Nephrostomy output looked strawberry red yesterday but the urethral catheter does not show any blood.  Currently aspirin on hold.  Not on heparin for DVT prophylaxis Continue home meds including Zytiga Mild measured about hemoglobin stable. Recent Labs    11/06/22 0715 11/06/22 1640 11/07/22 0306 11/08/22 0249 11/09/22 0307  HGB 10.4* 9.9* 10.8* 10.5* 10.0*  MCV 98.1  --  98.5 98.4  99.4   History of hypertension PTA on Lopressor 50 mg nightly Currently BP meds on hold because of septic shock.   CAD/MI/stents HLD PTA on aspirin 81 mg daily, Lipitor 80 mg daily Currently aspirin on hold  Osteoarthritis Spinal stenosis History of gout PTA on allopurinol, gabapentin 600 mg daily Gabapentin resumed  Goals of care   Code Status: DNR    Mobility: Encourage ambulation.  PT eval obtained.  Home with PT recommended  Scheduled Meds:  abiraterone acetate  1,000 mg Oral Daily   acetaminophen  500 mg Oral Q6H   amLODipine  5 mg Oral Daily   carvedilol  3.125 mg Oral BID WC   Chlorhexidine Gluconate Cloth  6 each Topical Daily   docusate sodium  100 mg Oral BID   feeding supplement  237 mL Oral TID BM   gabapentin  600 mg Oral Daily   lidocaine  1 patch Transdermal Q24H   oxybutynin  5 mg Oral TID   polyethylene glycol  17 g Oral BID   predniSONE  5 mg Oral Q breakfast   sodium chloride flush  5 mL Intracatheter Q8H    PRN meds: cyclobenzaprine, hydrALAZINE, iohexol, morphine injection, ondansetron (ZOFRAN) IV, mouth rinse, traMADol   Infusions:   sodium chloride Stopped (11/05/22 2254)   ceFEPime (MAXIPIME) IV Stopped (11/09/22 2157)   lactated ringers 125 mL/hr at 11/10/22 1121    Skin assessment:     Nutritional status:  Body mass index is 30.4 kg/m.  Nutrition Problem: Moderate Malnutrition Etiology: chronic illness (CKD, CAD, bladder cancer, prostate adenocarcinoma) Signs/Symptoms: moderate fat depletion, moderate muscle depletion     Diet:  Diet Order             Diet regular Room service appropriate? Yes; Fluid consistency: Thin  Diet effective now                   DVT prophylaxis:  SCDs Start: 11/05/22 0050   Antimicrobials: IV cefepime Fluid: Currently on LR at 125 mill per hour to continue same. Consultants: ID, PCCM, urology Family Communication: Wife at bedside.  Status is: Inpatient  Continue in-hospital care  because: Improving renal function, remains on IV antibiotics, IV fluid. Level of care: Telemetry.  Can transfer to medical telemetry today.  Dispo: The patient is from: Home              Anticipated d/c is to: Home with PT in 1 to 2 days              Patient currently is not medically stable to d/c.   Difficult to place patient No    Antimicrobials: Anti-infectives (From admission, onward)    Start     Dose/Rate Route Frequency Ordered Stop   11/06/22 2200  vancomycin (VANCOREADY) IVPB 1250 mg/250 mL  Status:  Discontinued        1,250 mg 166.7 mL/hr over 90 Minutes Intravenous Every 48 hours 11/05/22 0054 11/05/22 2223   11/05/22 2200  ceFEPIme (MAXIPIME) 2 g in  sodium chloride 0.9 % 100 mL IVPB        2 g 200 mL/hr over 30 Minutes Intravenous Every 24 hours 11/05/22 0054     11/04/22 2130  vancomycin (VANCOCIN) IVPB 1000 mg/200 mL premix        1,000 mg 200 mL/hr over 60 Minutes Intravenous Every hour 11/04/22 2046 11/04/22 2347   11/04/22 2100  ceFEPIme (MAXIPIME) 2 g in sodium chloride 0.9 % 100 mL IVPB        2 g 200 mL/hr over 30 Minutes Intravenous  Once 11/04/22 2046 11/04/22 2150       Objective: Vitals:   11/10/22 1219 11/10/22 1300  BP: (!) 154/84 (!) 95/59  Pulse:  84  Resp: (!) 25 20  Temp:    SpO2:  (!) 89%    Intake/Output Summary (Last 24 hours) at 11/10/2022 1333 Last data filed at 11/10/2022 1219 Gross per 24 hour  Intake 4257.96 ml  Output 5100 ml  Net -842.04 ml   Filed Weights   11/05/22 0435 11/06/22 0500 11/07/22 0500  Weight: 94.8 kg 96.1 kg 96.1 kg   Weight change:  Body mass index is 30.4 kg/m.   Physical Exam: General exam: Pleasant, elderly Caucasian male.  Headache mild and improving Skin: No rashes, lesions or ulcers. HEENT: Atraumatic, normocephalic, no obvious bleeding Lungs: Clear to auscultation bilaterally CVS: Regular rate and rhythm, no murmur GI/Abd soft, distended abdomen, mild tenderness probably because of  constipation.  Bowel sound present CNS: Alert, awake, oriented x 3. Psychiatry: Mood appropriate Extremities: No pedal edema, no calf tenderness  Data Review: I have personally reviewed the laboratory data and studies available.  F/u labs ordered Unresulted Labs (From admission, onward)     Start     Ordered   11/11/22 0500  CBC with Differential/Platelet  Daily,   R     Question:  Specimen collection method  Answer:  Lab=Lab collect   11/10/22 0747   11/11/22 0569  Basic metabolic panel  Daily,   R     Question:  Specimen collection method  Answer:  Lab=Lab collect   11/10/22 0747            Total time spent in review of labs and imaging, patient evaluation, formulation of plan, documentation and communication with family -46 minutes  Signed, Terrilee Croak, MD [facility] 11/10/2022

## 2022-11-11 LAB — CBC WITH DIFFERENTIAL/PLATELET
Abs Immature Granulocytes: 0.76 10*3/uL — ABNORMAL HIGH (ref 0.00–0.07)
Basophils Absolute: 0.1 10*3/uL (ref 0.0–0.1)
Basophils Relative: 1 %
Eosinophils Absolute: 0.2 10*3/uL (ref 0.0–0.5)
Eosinophils Relative: 3 %
HCT: 30.2 % — ABNORMAL LOW (ref 39.0–52.0)
Hemoglobin: 9.9 g/dL — ABNORMAL LOW (ref 13.0–17.0)
Immature Granulocytes: 9 %
Lymphocytes Relative: 15 %
Lymphs Abs: 1.2 10*3/uL (ref 0.7–4.0)
MCH: 32.4 pg (ref 26.0–34.0)
MCHC: 32.8 g/dL (ref 30.0–36.0)
MCV: 98.7 fL (ref 80.0–100.0)
Monocytes Absolute: 0.5 10*3/uL (ref 0.1–1.0)
Monocytes Relative: 6 %
Neutro Abs: 5.4 10*3/uL (ref 1.7–7.7)
Neutrophils Relative %: 66 %
Platelets: 195 10*3/uL (ref 150–400)
RBC: 3.06 MIL/uL — ABNORMAL LOW (ref 4.22–5.81)
RDW: 13.6 % (ref 11.5–15.5)
WBC: 8.2 10*3/uL (ref 4.0–10.5)
nRBC: 0 % (ref 0.0–0.2)

## 2022-11-11 LAB — BASIC METABOLIC PANEL
Anion gap: 8 (ref 5–15)
BUN: 46 mg/dL — ABNORMAL HIGH (ref 8–23)
CO2: 26 mmol/L (ref 22–32)
Calcium: 8.8 mg/dL — ABNORMAL LOW (ref 8.9–10.3)
Chloride: 105 mmol/L (ref 98–111)
Creatinine, Ser: 2.52 mg/dL — ABNORMAL HIGH (ref 0.61–1.24)
GFR, Estimated: 26 mL/min — ABNORMAL LOW (ref >60)
Glucose, Bld: 107 mg/dL — ABNORMAL HIGH (ref 70–99)
Potassium: 3.5 mmol/L (ref 3.5–5.1)
Sodium: 139 mmol/L (ref 135–145)

## 2022-11-11 MED ORDER — ENSURE ENLIVE PO LIQD: 237.0000 mL | Freq: Three times a day (TID) | ORAL | 12 refills | Status: AC

## 2022-11-11 MED ORDER — LEVOFLOXACIN 750 MG PO TABS: 750.0000 mg | ORAL_TABLET | ORAL | 0 refills | Status: DC

## 2022-11-11 MED ORDER — AMLODIPINE BESYLATE 5 MG PO TABS: 5.0000 mg | ORAL_TABLET | Freq: Every day | ORAL | 2 refills | Status: AC

## 2022-11-11 MED ORDER — CARVEDILOL 3.125 MG PO TABS: 3.1250 mg | ORAL_TABLET | Freq: Two times a day (BID) | ORAL | 2 refills | Status: AC

## 2022-11-11 MED ORDER — TRAMADOL HCL 50 MG PO TABS: 50.0000 mg | ORAL_TABLET | Freq: Two times a day (BID) | ORAL | 0 refills | Status: AC | PRN

## 2022-11-11 MED ORDER — CYCLOBENZAPRINE HCL 5 MG PO TABS: 5.0000 mg | ORAL_TABLET | Freq: Three times a day (TID) | ORAL | 0 refills | Status: AC | PRN

## 2022-11-11 MED ORDER — OXYBUTYNIN CHLORIDE 5 MG PO TABS: 5.0000 mg | ORAL_TABLET | Freq: Three times a day (TID) | ORAL | 0 refills | Status: AC

## 2022-11-11 MED ORDER — LEVOFLOXACIN 500 MG PO TABS
750.0000 mg | ORAL_TABLET | ORAL | Status: DC
  Administered 2022-11-11: 750 mg via ORAL
  Filled 2022-11-11: qty 2

## 2022-11-11 MED ORDER — SENNOSIDES-DOCUSATE SODIUM 8.6-50 MG PO TABS: 1.0000 | ORAL_TABLET | Freq: Every day | ORAL | Status: AC

## 2022-11-11 NOTE — Progress Notes (Signed)
[facility]  Date of Admission:  11/04/2022   Total days of inpatient antibiotics 7  Principal Problem:   Sepsis ([place]) Active Problems:   AKI (acute kidney injury) ([place])   Malnutrition of moderate degree   Serratia infection   Obstructive pyelonephritis   Bacteremia          Assessment: 76 year old male admitted with Serratia bacteremia secondary to Serratia UTI #Surgeon bacteremia secondary to urinary source with hydronephrosis #S[place]ia UTI -Initial CT showed severe left hydronephrosis and perinephric stranding due to 4 mm left UVJ's stone on 12/11 - Status post nephrostomy placement on 11/05/2021 introversion radiology - Urology planning on cystoscopy for stone management on 11/15/2022 outpatient - Patient has no abdominal pain, fevers or chills today.  I think it is okay to switch to Levaquin due to its high bioavailability. #AKI -Serum creatinine trending down, AKI improving.  Serum creatinine 2.52 today. Recommendations: -D/c cefepime -Start levaquin(renally doses).  Plan on 2 weeks of antibiotics from 12/11-12/25.  This will provide additional 3 days of coverage following urology procedure on 12/22.   ID will sign off   Microbiology:   Antibiotics: Cefepime 12/11-p Vancomycin 12/11 Cultures: Blood 12/11 2/2 serratia marcescens 12/14 NG Urine 12/12 serratia marcescens    SUBJECTIVE: Resting in bed. No new complaints.  Interval:  Afebrile overnight Review of Systems: Review of Systems  All other systems reviewed and are negative.    Scheduled Meds:  abiraterone acetate  1,000 mg Oral Daily   acetaminophen  500 mg Oral Q6H   amLODipine  5 mg Oral Daily   carvedilol  3.125 mg Oral BID WC   Chlorhexidine Gluconate Cloth  6 each Topical Daily   docusate sodium  100 mg Oral BID   feeding supplement  237 mL Oral TID BM   gabapentin  600 mg Oral Daily   levofloxacin  750 mg Oral Q48H   lidocaine  1 patch  Transdermal Q24H   oxybutynin  5 mg Oral TID   polyethylene glycol  17 g Oral BID   predniSONE  5 mg Oral Q breakfast   sodium chloride flush  5 mL Intracatheter Q8H   Continuous Infusions:  sodium chloride Stopped (11/05/22 2254)   lactated ringers Stopped (11/10/22 1528)   PRN Meds:.cyclobenzaprine, hydrALAZINE, iohexol, morphine injection, ondansetron (ZOFRAN) IV, mouth rinse, traMADol No Known Allergies  OBJECTIVE: Vitals:   11/10/22 2348 11/11/22 0431 11/11/22 0440 11/11/22 0802  BP: (!) 155/92 (!) 146/79  129/82  Pulse: 84 100  100  Resp: '16 16  18  '$ Temp: 98.6 F (37 C) 99.1 F (37.3 C)  97.7 F (36.5 C)  TempSrc: Oral Oral  Oral  SpO2: 95% 92%  93%  Weight:   96.8 kg   Height:       Body mass index is 30.62 kg/m.  Physical Exam Constitutional:      General: He is not in acute distress.    Appearance: He is normal weight. He is not toxic-appearing.  HENT:     Head: Normocephalic and atraumatic.     Right Ear: External ear normal.     Left Ear: External ear normal.     Nose: No congestion or rhinorrhea.     Mouth/Throat:     Mouth: Mucous membranes are moist.     Pharynx: Oropharynx is clear.  Eyes:     Extraocular Movements: Extraocular movements intact.     Conjunctiva/sclera: Conjunctivae normal.  Pupils: Pupils are equal, round, and reactive to light.  Cardiovascular:     Rate and Rhythm: Normal rate and regular rhythm.     Heart sounds: No murmur heard.    No friction rub. No gallop.  Pulmonary:     Effort: Pulmonary effort is normal.     Breath sounds: Normal breath sounds.  Abdominal:     General: Abdomen is flat. Bowel sounds are normal.     Palpations: Abdomen is soft.  Musculoskeletal:        General: No swelling. Normal range of motion.     Cervical back: Normal range of motion and neck supple.  Skin:    General: Skin is warm and dry.  Neurological:     General: No focal deficit present.     Mental Status: He is oriented to person,  place, and time.  Psychiatric:        Mood and Affect: Mood normal.       Lab Results Lab Results  Component Value Date   WBC 8.2 11/11/2022   HGB 9.9 (L) 11/11/2022   HCT 30.2 (L) 11/11/2022   MCV 98.7 11/11/2022   PLT 195 11/11/2022    Lab Results  Component Value Date   CREATININE 2.52 (H) 11/11/2022   BUN 46 (H) 11/11/2022   NA 139 11/11/2022   K 3.5 11/11/2022   CL 105 11/11/2022   CO2 26 11/11/2022    Lab Results  Component Value Date   ALT 16 11/05/2022   AST 48 (H) 11/05/2022   ALKPHOS 37 (L) 11/05/2022   BILITOT 1.0 11/05/2022        Laurice Record, [place] [facility] 11/11/2022, 11:39 AM

## 2022-11-11 NOTE — TOC Progression Note (Addendum)
Transition of Care [facility]) - Progression Note    Patient Details  Name: [name] MRN: [id number] Date of Birth: 05/24/1946  Transition of Care [facility]) CM/SW Jette, RN Phone Number:[phone]  11/11/2022, 12:41 PM  Clinical Narrative:    TOC consulted for [facility] /DME. CM at bedside to offer choice for home health. Home health referral has been accepted by Hoyle Sauer with [facility]. Wife states that she has a rolling walker at home. No other needs noted. AVS has been updated.      Barriers to Discharge: Continued Medical Work up  Expected Discharge Plan and Services   In-house Referral: NA Discharge Planning Services: CM Consult   Living arrangements for the past 2 months: Single Family Home Expected Discharge Date: 11/11/22                                     Social Determinants of Health (SDOH) Interventions Housing Interventions: Intervention Not Indicated  Readmission Risk Interventions     No data to display

## 2022-11-11 NOTE — Discharge Summary (Signed)
Physician Discharge Summary  OSA [name]:[id number] DOB: September 17, 1946 DOA: 11/04/2022  PCP: Duffy Rhody, MD  Admit date: 11/04/2022 Discharge date: 11/11/2022  Admitted From: Home Discharge disposition: Home with home health  Recommendations at discharge:  Oral Levaquin 750 mg every other day till 12/26 Continue as needed pain meds with Ultram, Flexeril. Continue to hold aspirin If blood pressure medicine has been switched to Coreg and amlodipine. Take stool softeners as needed Follow-up with urologist as an outpatient   Brief narrative: [name] is a 76 y.o. male with PMH significant for HTN, HLD, CAD/MI/stents, CKD 3, nephrolithiasis, history of bladder cancer s/p radiation, radiation cystitis, diverticulosis, gout, osteoarthritis, spinal stenosis. 12/11, patient was taken to urgent care for 1 day history of nausea, vomiting, diarrhea increased somnolence and COVID test positive at home.  At the urgent care, he had a temperature 102, tachycardia and hypotension and hence he was sent to the ED. In the ED, he was also noted to have elevated WBC count, lactic acid of 7.9. CT scan showed left hydronephrosis with pyelonephritis with an obstructing UPJ stone. Started on IV antibiotics, IV fluid. He was initially admitted to ICU for septic shock due to pyelonephritis 12/12, per urology recommendation underwent left nephrostomy tube placement by IR Blood cultures and urine culture sent on admission grew Serratia marcescens Transferred out to [facility] 12/15 See below for details  Subjective: Patient was seen and examined this morning.   Lying on bed.  Headache still there but gradually improving.  Wife at bedside.  Labs reassuring. IV antibiotics switched to oral today.  Assessment and plan: Septic shock POA Left pyelonephritis Left hydronephrosis due to obstructing UVJ stone s/p left nephrostomy IR 12/12 UTI and bacteremia with Serratia marcescens He was  initially admitted to ICU for septic shock due to pyelonephritis 12/12, per urology recommendation underwent left nephrostomy tube placement by IR Blood cultures and urine culture sent on admission grew Serratia marcescens Transferred out to [facility] 12/15 Currently on IV cefepime per ID.  Clinically improved.  No fever.  WBC count normalized.  Lactic acid level normalized.  Discussed with ID.  Plan to discharge on Levaquin to complete 2 weeks course of antibiotic therapy on 12/26.  Noted a plan from urology to internalize a stent on 12/22.  Continue oxybutynin, Ultram as needed at home for pain control. Recent Labs  Lab 11/04/22 2047 11/04/22 2250 11/05/22 0639 11/06/22 0715 11/07/22 0306 11/08/22 0249 11/09/22 0303 11/09/22 0307 11/11/22 0737  WBC 21.1*  --  11.8* 6.7 5.5 5.9  --  7.3 8.2  LATICACIDVEN 7.9* 5.8* 2.7*  --   --   --  1.2  --   --     Posterior headache In the hospital he has been complaining of mild headache.  Gradually improving on gabapentin, as needed Ultram and as needed Flexeril.  As needed Ultram 12/16, CT head unremarkable.  AKI on CKD3a Baseline creatinine 1.3 from July 2023.  Presented with an elevated creatinine which peaked at 4.52.  Gradually improving with IV hydration.  2.52 today.  Adequately hydrated.  Plan to discharge today.  He will need follow-up blood work as an outpatient with PCP/urology. I suggested him to avoid Ultram because of renal impairment but he is allergic to other opioids and has been tolerating Ultram in the hospital. Recent Labs    05/29/22 0923 11/04/22 2047 11/05/22 8588 11/06/22 0249 11/06/22 0715 11/07/22 0306 11/08/22 0249 11/09/22 0307 11/10/22 0823 11/11/22 0737  BUN 18  35* 38*  --  53* 48* 46* 47* 44* 46*  CREATININE 1.30* 3.92* 4.17* 4.52* 4.38* 3.74* 3.25* 2.92* 2.64* 2.52*   Hypokalemia/hypomagnesemia Levels improved with replacement. Recent Labs  Lab 11/05/22 0639 11/06/22 0715 11/07/22 0306 11/08/22 0249  11/09/22 0307 11/10/22 0823 11/11/22 0737  K 4.1 3.3* 3.2* 3.4* 3.1* 3.0* 3.5  MG 1.3* 1.7 2.1  --   --   --   --   PHOS 4.2  --   --   --   --   --   --   Diarrhea/constipation History of diverticulosis Per wife, patient had diarrhea at home prior to presentation but since admission, he did not have bowel movement for 5 days.  He was placed on scheduled stool softeners and laxative.  Finally had a BM on 12/16.  Continue stool softeners at home.   History of bladder cancer s/p radiation  Mild hematuria due to radiation cystitis Wife noticed mild hematuria 2 days ago.  Nephrostomy output looked strawberry red yesterday but the urethral catheter does not show any blood.  Currently aspirin on hold.  I will continue to hold aspirin till at least the procedure on 12/22. Continue home meds including Zytiga Hemoglobin stable close to 10. Recent Labs    11/06/22 1640 11/07/22 0306 11/08/22 0249 11/09/22 0307 11/11/22 0737  HGB 9.9* 10.8* 10.5* 10.0* 9.9*  MCV  --  98.5 98.4 99.4 98.7   History of hypertension PTA on Lopressor 50 mg nightly.  It was held on admission because of septic shock.  He was later started on carvedilol and amlodipine.  Blood pressure control is better with these medicines.  At discharge, he will continue carvedilol 3.125 mg twice daily and amlodipine 5 mg daily.   CAD/MI/stents HLD PTA on aspirin 81 mg daily, Lipitor 80 mg daily Currently aspirin on hold.  Continue Lipitor  Osteoarthritis Spinal stenosis History of gout PTA on allopurinol, gabapentin 600 mg daily, chronic prednisone.  Continue  Wounds:  -    Discharge Exam:   Vitals:   11/10/22 2348 11/11/22 0431 11/11/22 0440 11/11/22 0802  BP: (!) 155/92 (!) 146/79  129/82  Pulse: 84 100  100  Resp: '16 16  18  '$ Temp: 98.6 F (37 C) 99.1 F (37.3 C)  97.7 F (36.5 C)  TempSrc: Oral Oral  Oral  SpO2: 95% 92%  93%  Weight:   96.8 kg   Height:        Body mass index is 30.62 kg/m.   General  exam: Pleasant, elderly Caucasian male.  Headache mild and improving Skin: No rashes, lesions or ulcers. HEENT: Atraumatic, normocephalic, no obvious bleeding Lungs: Clear to auscultation bilaterally CVS: Regular rate and rhythm, no murmur GI/Abd soft, distended abdomen, mild tenderness probably because of constipation.  Bowel sound present CNS: Alert, awake, oriented x 3. Psychiatry: Mood appropriate Extremities: No pedal edema, no calf tenderness  Follow ups:    Follow-up Information     Duffy Rhody, MD Follow up.   Specialty: Hospitalist Contact information: [facility] [place] [address] [phone]                 Discharge Instructions:   Discharge Instructions     Call MD for:  difficulty breathing, headache or visual disturbances   Complete by: As directed    Call MD for:  extreme fatigue   Complete by: As directed    Call MD for:  hives   Complete by: As directed  Call MD for:  persistant dizziness or light-headedness   Complete by: As directed    Call MD for:  persistant nausea and vomiting   Complete by: As directed    Call MD for:  severe uncontrolled pain   Complete by: As directed    Call MD for:  temperature >100.4   Complete by: As directed    Diet - low sodium heart healthy   Complete by: As directed    Discharge instructions   Complete by: As directed    Recommendations at discharge:   Oral Levaquin 750 mg every other day till 12/26  Continue as needed pain meds with Ultram, Flexeril.  Continue to hold aspirin  If blood pressure medicine has been switched to Coreg and amlodipine.  Take stool softeners as needed  Follow-up with urologist as an outpatient  General discharge instructions: Follow with Primary MD Duffy Rhody, MD in 7 days  Please request your PCP  to go over your hospital tests, procedures, radiology results at the follow up. Please get your medicines reviewed and adjusted.  Your PCP may decide to repeat  certain labs or tests as needed. Do not drive, operate heavy machinery, perform activities at heights, swimming or participation in water activities or provide baby sitting services if your were admitted for syncope or siezures until you have seen by Primary MD or a Neurologist and advised to do so again. [place] Controlled Substance Reporting System database was reviewed. Do not drive, operate heavy machinery, perform activities at heights, swim, participate in water activities or provide baby-sitting services while on medications for pain, sleep and mood until your outpatient physician has reevaluated you and advised to do so again.  You are strongly recommended to comply with the dose, frequency and duration of prescribed medications. Activity: As tolerated with Full fall precautions use walker/cane & assistance as needed Avoid using any recreational substances like cigarette, tobacco, alcohol, or non-prescribed drug. If you experience worsening of your admission symptoms, develop shortness of breath, life threatening emergency, suicidal or homicidal thoughts you must seek medical attention immediately by calling 911 or calling your MD immediately  if symptoms less severe. You must read complete instructions/literature along with all the possible adverse reactions/side effects for all the medicines you take and that have been prescribed to you. Take any new medicine only after you have completely understood and accepted all the possible adverse reactions/side effects.  Wear Seat belts while driving. You were cared for by a hospitalist during your hospital stay. If you have any questions about your discharge medications or the care you received while you were in the hospital after you are discharged, you can call the unit and ask to speak with the hospitalist or the covering physician. Once you are discharged, your primary care physician will handle any further medical issues. Please note that NO  REFILLS for any discharge medications will be authorized once you are discharged, as it is imperative that you return to your primary care physician (or establish a relationship with a primary care physician if you do not have one).   Discharge wound care:   Complete by: As directed    Increase activity slowly   Complete by: As directed        Discharge Medications:   Allergies as of 11/11/2022   No Known Allergies      Medication List     STOP taking these medications    aspirin EC 81 MG tablet   metoprolol  tartrate 25 MG tablet Commonly known as: LOPRESSOR       TAKE these medications    abiraterone acetate 250 MG tablet Commonly known as: ZYTIGA Take 1,000 mg by mouth daily. Take on an empty stomach   acetaminophen 500 MG tablet Commonly known as: TYLENOL Take 1,000 mg by mouth daily as needed for headache or moderate pain.   allopurinol 300 MG tablet Commonly known as: ZYLOPRIM Take 300 mg by mouth daily.   amLODipine 5 MG tablet Commonly known as: NORVASC Take 1 tablet (5 mg total) by mouth daily. Start taking on: November 12, 2022   ascorbic acid 500 MG tablet Commonly known as: VITAMIN C Take 500 mg by mouth daily.   atorvastatin 80 MG tablet Commonly known as: LIPITOR Take 40 mg by mouth daily.   carvedilol 3.125 MG tablet Commonly known as: COREG Take 1 tablet (3.125 mg total) by mouth 2 (two) times daily with a meal.   cyclobenzaprine 5 MG tablet Commonly known as: FLEXERIL Take 1 tablet (5 mg total) by mouth 3 (three) times daily as needed for up to 5 days for muscle spasms (tension HA).   feeding supplement Liqd Take 237 mLs by mouth 3 (three) times daily between meals.   Fish Oil 1200 MG Caps Take 1,200 mg by mouth daily.   gabapentin 300 MG capsule Commonly known as: NEURONTIN Take 600 mg by mouth daily.   levofloxacin 750 MG tablet Commonly known as: LEVAQUIN Take 1 tablet (750 mg total) by mouth every other day for 6  days. Start taking on: November 13, 2022   MULTIVITAMIN PO Take 1 tablet by mouth daily.   nitroGLYCERIN 0.4 MG SL tablet Commonly known as: NITROSTAT Place 1 tablet (0.4 mg total) under the tongue every 5 (five) minutes as needed for chest pain.   ondansetron 4 MG disintegrating tablet Commonly known as: ZOFRAN-ODT Take 1 tablet (4 mg total) by mouth every 8 (eight) hours as needed for nausea or vomiting.   OVER THE COUNTER MEDICATION Take 750 mg by mouth daily. Osteo Bi Flex Glucosamin   OVER THE COUNTER MEDICATION Take 1 tablet by mouth daily. Calcium 1200 mg + D3 25 mcg (2000 units)   oxybutynin 5 MG tablet Commonly known as: DITROPAN Take 1 tablet (5 mg total) by mouth 3 (three) times daily for 14 days.   Pfizer COVID-19 Vac Bivalent injection Generic drug: COVID-19 mRNA bivalent vaccine Therapist, music) Inject into the muscle.   predniSONE 5 MG tablet Commonly known as: DELTASONE Take 5 mg by mouth daily.   senna-docusate 8.6-50 MG tablet Commonly known as: Senokot-S Take 1 tablet by mouth daily.   traMADol 50 MG tablet Commonly known as: ULTRAM Take 1 tablet (50 mg total) by mouth every 12 (twelve) hours as needed for up to 5 days for severe pain. What changed:  how much to take when to take this reasons to take this   triamcinolone cream 0.1 % Commonly known as: KENALOG Apply 1 Application topically daily.   Vitamin D3 25 MCG (1000 UT) Caps Take 1,000 Units by mouth daily.   Zinc 50 MG Tabs Take 50 mg by mouth daily in the afternoon.               Durable Medical Equipment  (From admission, onward)           Start     Ordered   11/10/22 1007  For home use only DME Walker rolling  Once  Question Answer Comment  Walker: With [place]   Patient needs a walker to treat with the following condition Impaired mobility      11/10/22 1006   11/08/22 1528  For home use only DME Walker rolling  Once       Question Answer Comment  Walker:  With [place]   Patient needs a walker to treat with the following condition Difficulty in walking, not elsewhere classified      11/08/22 1527              Discharge Care Instructions  (From admission, onward)           Start     Ordered   11/11/22 0000  Discharge wound care:        11/11/22 1131             The results of significant diagnostics from this hospitalization (including imaging, microbiology, ancillary and laboratory) are listed below for reference.    Procedures and Diagnostic Studies:   IR NEPHROSTOMY PLACEMENT LEFT  Result Date: 11/05/2022 INDICATION: 76 year old with urosepsis secondary to an obstructing stone at the left ureterovesical junction. Right kidney is atrophic. EXAM: LEFT PERCUTANEOUS NEPHROSTOMY TUBE PLACEMENT WITH ULTRASOUND AND FLUOROSCOPIC GUIDANCE COMPARISON:  CT 11/04/2022 MEDICATIONS: Antibiotics were given prior to the procedure by the primary care team. ANESTHESIA/SEDATION: Moderate (conscious) sedation was employed during this procedure. A total of Versed 1.0 mg and Fentanyl 25 mcg was administered intravenously by the radiology nurse. Total intra-service moderate Sedation Time: 19 minutes. The patient's level of consciousness and vital signs were monitored continuously by radiology nursing throughout the procedure under my direct supervision. CONTRAST:  10 mL Omnipaque 300-administered into the collecting system(s) FLUOROSCOPY: Radiation Exposure Index (as provided by the fluoroscopic device): 28 mGy Kerma COMPLICATIONS: None immediate. PROCEDURE: Informed written consent was obtained from the patient's wife after a thorough discussion of the procedural risks, benefits and alternatives. All questions were addressed. Maximal Sterile Barrier Technique was utilized including caps, mask, sterile gowns, sterile gloves, sterile drape, hand hygiene and skin antiseptic. A timeout was performed prior to the initiation of the procedure. Patient  was placed prone on the interventional table with the left side slightly rolled up. Left flank was prepped and draped in sterile fashion. Ultrasound was used to identify the left kidney from an intercostal approach. Dilated mid/upper pole calyx was targeted. Skin was anesthetized with 1% lidocaine and a small incision was made. Using ultrasound guidance, a 21 gauge needle was directed into the dilated collecting system and contrast was injected to confirm placement in the collecting system. 0.018 wire was advanced into the collecting system and Accustick dilator set was placed. Tract was dilated over a J wire and a 10.2 [place] multipurpose drain was advanced over the wire and reconstituted in the renal pelvis. Cloudy purulent-looking yellow fluid was removed from the renal collecting system. Small amount of contrast was injected to confirm placement in the renal pelvis. Nephrostomy tube was attached to a gravity bag and sutured to skin. Fluoroscopic and ultrasound images were taken and saved for documentation. FINDINGS: Moderate left hydronephrosis. It was difficult to visualize the kidney due to overlying ribs. Dilated calyx in the mid/upper pole region was accessed. Left renal collecting system was decompressed at the end of the procedure. IMPRESSION: Successful placement of left percutaneous nephrostomy tube using ultrasound and fluoroscopic guidance. Electronically Signed   By: Markus Daft M.D.   On: 11/05/2022 09:21   CT ABDOMEN PELVIS  WO CONTRAST  Result Date: 11/04/2022 CLINICAL DATA:  Abdominal pain, postop EXAM: CT ABDOMEN AND PELVIS WITHOUT CONTRAST TECHNIQUE: Multidetector CT imaging of the abdomen and pelvis was performed following the standard protocol without IV contrast. RADIATION DOSE REDUCTION: This exam was performed according to the departmental dose-optimization program which includes automated exposure control, adjustment of the mA and/or kV according to patient size and/or use of iterative  reconstruction technique. COMPARISON:  05/29/2022 FINDINGS: Lower chest: No acute abnormality Hepatobiliary: Diffuse low-density throughout the liver compatible with fatty infiltration. No focal abnormality. Gallbladder unremarkable. Pancreas: No focal abnormality or ductal dilatation. Spleen: No focal abnormality.  Normal size. Adrenals/Urinary Tract: Adrenal glands normal. Severe left hydronephrosis and perinephric stranding due to 4 mm left UVJ stone. Multiple nonobstructing stones in the left kidney. No stones or hydronephrosis on the right. Urinary bladder decompressed, grossly unremarkable. Stomach/Bowel: Sigmoid diverticulosis. No active diverticulitis. Stomach and small bowel decompressed, unremarkable. Vascular/Lymphatic: Aortic atherosclerosis. No evidence of aneurysm or adenopathy. Reproductive: No visible focal abnormality.   Penile implant noted. Other: No free fluid or free air. Musculoskeletal: No acute bony abnormality. IMPRESSION: Severe left hydronephrosis and perinephric stranding due to 4 mm left UVJ stone. Left nephrolithiasis. Hepatic steatosis. Aortic atherosclerosis. Sigmoid diverticulosis. Electronically Signed   By: Rolm Baptise M.D.   On: 11/04/2022 21:57   DG Chest 2 View  Result Date: 11/04/2022 CLINICAL DATA:  Shortness of breath EXAM: CHEST - 2 VIEW COMPARISON:  05/29/2022 FINDINGS: Cardiac shadow is stable. Lungs are well aerated bilaterally. No focal infiltrate or effusion is seen. No bony abnormality is noted. IMPRESSION: No acute abnormality seen. Electronically Signed   By: Inez Catalina M.D.   On: 11/04/2022 21:12     Labs:   Basic Metabolic Panel: Recent Labs  Lab 11/05/22 0639 11/06/22 0249 11/06/22 0715 11/07/22 0306 11/08/22 0249 11/09/22 0307 11/10/22 0823 11/11/22 0737  NA 139  --  138 137 141 138 140 139  K 4.1  --  3.3* 3.2* 3.4* 3.1* 3.0* 3.5  CL 107  --  109 106 107 99 102 105  CO2 22  --  20* 20* '22 27 28 26  '$ GLUCOSE 114*  --  136* 114* 97 108*  125* 107*  BUN 38*  --  53* 48* 46* 47* 44* 46*  CREATININE 4.17*   < > 4.38* 3.74* 3.25* 2.92* 2.64* 2.52*  CALCIUM 8.1*  --  8.0* 8.6* 9.0 8.9 8.8* 8.8*  MG 1.3*  --  1.7 2.1  --   --   --   --   PHOS 4.2  --   --   --   --   --   --   --    < > = values in this interval not displayed.   GFR Estimated Creatinine Clearance: 29.1 mL/min (A) (by C-G formula based on SCr of 2.52 mg/dL (H)). Liver Function Tests: Recent Labs  Lab 11/04/22 2047 11/05/22 0639  AST 40 48*  ALT 14 16  ALKPHOS 49 37*  BILITOT 1.3* 1.0  PROT 6.9 5.5*  ALBUMIN 4.1 2.7*   Recent Labs  Lab 11/04/22 2047  LIPASE 44   No results for input(s): "AMMONIA" in the last 168 hours. Coagulation profile Recent Labs  Lab 11/06/22 1640  INR 1.3*    CBC: Recent Labs  Lab 11/04/22 2047 11/05/22 0639 11/06/22 0715 11/06/22 1640 11/07/22 0306 11/08/22 0249 11/09/22 0307 11/11/22 0737  WBC 21.1*   < > 6.7  --  5.5 5.9 7.3  8.2  NEUTROABS 19.0*  --   --   --   --  4.8 5.6 5.4  HGB 14.0   < > 10.4* 9.9* 10.8* 10.5* 10.0* 9.9*  HCT 41.9   < > 30.8* 29.2* 32.5* 31.6* 30.7* 30.2*  MCV 98.6   < > 98.1  --  98.5 98.4 99.4 98.7  PLT 129*   < > 65* 68* 71* 90* 109* 195   < > = values in this interval not displayed.   Cardiac Enzymes: No results for input(s): "CKTOTAL", "CKMB", "CKMBINDEX", "TROPONINI" in the last 168 hours. BNP: Invalid input(s): "POCBNP" CBG: Recent Labs  Lab 11/05/22 0137 11/05/22 1200  GLUCAP 127* 104*   D-Dimer No results for input(s): "DDIMER" in the last 72 hours. Hgb A1c No results for input(s): "HGBA1C" in the last 72 hours. Lipid Profile No results for input(s): "CHOL", "HDL", "LDLCALC", "TRIG", "CHOLHDL", "LDLDIRECT" in the last 72 hours. Thyroid function studies No results for input(s): "TSH", "T4TOTAL", "T3FREE", "THYROIDAB" in the last 72 hours.  Invalid input(s): "FREET3" Anemia work up No results for input(s): "VITAMINB12", "FOLATE", "FERRITIN", "TIBC", "IRON",  "RETICCTPCT" in the last 72 hours. Microbiology Recent Results (from the past 240 hour(s))  Blood culture (routine x 2)     Status: Abnormal   Collection Time: 11/04/22  8:40 PM   Specimen: BLOOD  Result Value Ref Range Status   Specimen Description   Final    BLOOD RIGHT ANTECUBITAL Performed at [facility], [address] [place], [place] [address]    Special Requests   Final    BOTTLES DRAWN AEROBIC AND ANAEROBIC Blood Culture adequate volume Performed at [facility], [address] [place], [place] [address]    Culture  Setup Time   Final    GRAM NEGATIVE RODS AEROBIC BOTTLE ONLY CRITICAL VALUE NOTED.  VALUE IS CONSISTENT WITH PREVIOUSLY REPORTED AND CALLED VALUE.    Culture (A)  Final    SERRATIA MARCESCENS SUSCEPTIBILITIES PERFORMED ON PREVIOUS CULTURE WITHIN THE LAST 5 DAYS. Performed at [facility], [place] [address], [place], [place] [address]    Report Status 11/07/2022 FINAL  Final  Blood culture (routine x 2)     Status: Abnormal   Collection Time: 11/04/22  8:48 PM   Specimen: BLOOD  Result Value Ref Range Status   Specimen Description   Final    BLOOD LEFT ANTECUBITAL Performed at [facility], [address], [place], [place] [address]    Special Requests   Final    BOTTLES DRAWN AEROBIC AND ANAEROBIC Blood Culture adequate volume Performed at [facility], [place],  [address]    Culture  Setup Time   Final    GRAM NEGATIVE RODS IN BOTH AEROBIC AND ANAEROBIC BOTTLES CRITICAL RESULT CALLED TO, READ BACK BY AND VERIFIED WITH: PHARMD L. [employer] [id number] @ [address] Performed at [facility], [place] [address], [place], [place] [address]    Culture SERRATIA MARCESCENS (A)  Final   Report Status 11/09/2022 FINAL  Final   Organism ID, Bacteria SERRATIA MARCESCENS  Final      Susceptibility   Serratia marcescens - MIC*    CEFAZOLIN >=64 RESISTANT  Resistant     CEFEPIME <=0.12 SENSITIVE Sensitive     CEFTAZIDIME <=1 SENSITIVE Sensitive     CEFTRIAXONE <=0.25 SENSITIVE Sensitive     CIPROFLOXACIN <=0.25 SENSITIVE Sensitive     GENTAMICIN <=1 SENSITIVE Sensitive     TRIMETH/SULFA <=  20 SENSITIVE Sensitive     * SERRATIA MARCESCENS  Blood Culture ID Panel (Reflexed)     Status: Abnormal   Collection Time: 11/04/22  8:48 PM  Result Value Ref Range Status   Enterococcus faecalis NOT DETECTED NOT DETECTED Final   Enterococcus Faecium NOT DETECTED NOT DETECTED Final   Listeria monocytogenes NOT DETECTED NOT DETECTED Final   Staphylococcus species NOT DETECTED NOT DETECTED Final   Staphylococcus aureus (BCID) NOT DETECTED NOT DETECTED Final   Staphylococcus epidermidis NOT DETECTED NOT DETECTED Final   Staphylococcus lugdunensis NOT DETECTED NOT DETECTED Final   Streptococcus species NOT DETECTED NOT DETECTED Final   Streptococcus agalactiae NOT DETECTED NOT DETECTED Final   Streptococcus pneumoniae NOT DETECTED NOT DETECTED Final   Streptococcus pyogenes NOT DETECTED NOT DETECTED Final   A.calcoaceticus-baumannii NOT DETECTED NOT DETECTED Final   Bacteroides fragilis NOT DETECTED NOT DETECTED Final   Enterobacterales DETECTED (A) NOT DETECTED Final    Comment: Enterobacterales represent a large order of gram negative bacteria, not a single organism. CRITICAL RESULT CALLED TO, READ BACK BY AND VERIFIED WITH: PHARMD L. [employer]air Laundry [id number] @ 2151 FH    Enterobacter cloacae complex NOT DETECTED NOT DETECTED Final   Escherichia coli NOT DETECTED NOT DETECTED Final   Klebsiella aerogenes NOT DETECTED NOT DETECTED Final   Klebsiella oxytoca NOT DETECTED NOT DETECTED Final   Klebsiella pneumoniae NOT DETECTED NOT DETECTED Final   Proteus species NOT DETECTED NOT DETECTED Final   Salmonella species NOT DETECTED NOT DETECTED Final   Serratia marcescens DETECTED (A) NOT DETECTED Final   Haemophilus influenzae NOT DETECTED NOT DETECTED Final    Neisseria meningitidis NOT DETECTED NOT DETECTED Final   Pseudomonas aeruginosa NOT DETECTED NOT DETECTED Final   Stenotrophomonas maltophilia NOT DETECTED NOT DETECTED Final   Candida albicans NOT DETECTED NOT DETECTED Final    Comment: CRITICAL RESULT CALLED TO, READ BACK BY AND VERIFIED WITH: PHARMD L. [employer]air Laundry [id number] @ 2151 FH     Candida auris NOT DETECTED NOT DETECTED Final   Candida glabrata NOT DETECTED NOT DETECTED Final   Candida krusei NOT DETECTED NOT DETECTED Final   Candida parapsilosis NOT DETECTED NOT DETECTED Final   Candida tropicalis NOT DETECTED NOT DETECTED Final   Cryptococcus neoformans/gattii NOT DETECTED NOT DETECTED Final   CTX-M ESBL NOT DETECTED NOT DETECTED Final   Carbapenem resistance IMP NOT DETECTED NOT DETECTED Final   Carbapenem resistance KPC NOT DETECTED NOT DETECTED Final   Carbapenem resistance NDM NOT DETECTED NOT DETECTED Final   Carbapenem resist OXA 48 LIKE NOT DETECTED NOT DETECTED Final   Carbapenem resistance VIM NOT DETECTED NOT DETECTED Final    Comment: Performed at [facility] [address], [place], [place] [address]  Gastrointestinal Panel by PCR , Stool     Status: None   Collection Time: 11/05/22 12:02 AM   Specimen: Stool  Result Value Ref Range Status   Campylobacter species NOT DETECTED NOT DETECTED Final   Plesimonas shigelloides NOT DETECTED NOT DETECTED Final   Salmonella species NOT DETECTED NOT DETECTED Final   Yersinia enterocolitica NOT DETECTED NOT DETECTED Final   Vibrio species NOT DETECTED NOT DETECTED Final   Vibrio cholerae NOT DETECTED NOT DETECTED Final   Enteroaggregative E coli (EAEC) NOT DETECTED NOT DETECTED Final   Enteropathogenic E coli (EPEC) NOT DETECTED NOT DETECTED Final   Enterotoxigenic E coli (ETEC) NOT DETECTED NOT DETECTED Final   Shiga like toxin producing E coli (STEC) NOT DETECTED NOT DETECTED  Final   Shigella/Enteroinvasive E coli (EIEC) NOT DETECTED NOT DETECTED Final    Cryptosporidium NOT DETECTED NOT DETECTED Final   Cyclospora cayetanensis NOT DETECTED NOT DETECTED Final   Entamoeba histolytica NOT DETECTED NOT DETECTED Final   Giardia lamblia NOT DETECTED NOT DETECTED Final   Adenovirus F40/41 NOT DETECTED NOT DETECTED Final   Astrovirus NOT DETECTED NOT DETECTED Final   Norovirus GI/GII NOT DETECTED NOT DETECTED Final   Rotavirus A NOT DETECTED NOT DETECTED Final   Sapovirus (I, II, IV, and V) NOT DETECTED NOT DETECTED Final    Comment: Performed at [facility], [address], [place], [place] [address]  C Difficile Quick Screen w PCR reflex     Status: None   Collection Time: 11/05/22 12:02 AM   Specimen: STOOL  Result Value Ref Range Status   C Diff antigen NEGATIVE NEGATIVE Final   C Diff toxin NEGATIVE NEGATIVE Final   C Diff interpretation No C. difficile detected.  Final    Comment: Performed at [facility], [place] [address], [place], [place] [address]  MRSA Next Gen by PCR, Nasal     Status: None   Collection Time: 11/05/22 12:02 AM   Specimen: Nasal Mucosa; Nasal Swab  Result Value Ref Range Status   MRSA by PCR Next Gen NOT DETECTED NOT DETECTED Final    Comment: (NOTE) The GeneXpert MRSA Assay (FDA approved for NASAL specimens only), is one component of a comprehensive MRSA colonization surveillance program. It is not intended to diagnose MRSA infection nor to guide or monitor treatment for MRSA infections. Test performance is not FDA approved in patients less than 25 years old. Performed at [facility], [place] [address] [place], [place] [address]   SARS Coronavirus 2 by RT PCR (hospital order, performed in [facility] hospital lab) *cepheid single result test* Anterior Nasal Swab     Status: None   Collection Time: 11/05/22  1:28 AM   Specimen: Anterior Nasal Swab  Result Value Ref Range Status   SARS Coronavirus 2 by RT PCR NEGATIVE NEGATIVE Final    Comment: (NOTE) SARS-CoV-2  target nucleic acids are NOT DETECTED.  The SARS-CoV-2 RNA is generally detectable in upper and lower respiratory specimens during the acute phase of infection. The lowest concentration of SARS-CoV-2 viral copies this assay can detect is 250 copies / mL. A negative result does not preclude SARS-CoV-2 infection and should not be used as the sole basis for treatment or other patient management decisions.  A negative result may occur with improper specimen collection / handling, submission of specimen other than nasopharyngeal swab, presence of viral mutation(s) within the areas targeted by this assay, and inadequate number of viral copies (<250 copies / mL). A negative result must be combined with clinical observations, patient history, and epidemiological information.  Fact Sheet for Patients:   https://www.patel.info/  Fact Sheet for Healthcare Providers: https://hall.com/  This test is not yet approved or  cleared by the [place] FDA and has been authorized for detection and/or diagnosis of SARS-CoV-2 by FDA under an Emergency Use Authorization (EUA).  This EUA will remain in effect (meaning this test can be used) for the duration of the COVID-19 declaration under Section 564(b)(1) of the Act, 21 U.S.C. section 360bbb-3(b)(1), unless the authorization is terminated or revoked sooner.  Performed at [facility], [place] [address], [place], [place] [address]   Urine Culture     Status: Abnormal   Collection Time:  11/05/22  3:00 AM   Specimen: In/Out Cath Urine  Result Value Ref Range Status   Specimen Description   Final    IN/OUT CATH URINE URINE BAG Performed at [facility] [address] [place], [place] [address]    Special Requests   Final    FLUID FROM LEFT NEPHROSTOMY Performed at [facility] [address], [place], [place] [address]    Culture 80,000 COLONIES/mL  SERRATIA MARCESCENS (A)  Final   Report Status 11/07/2022 FINAL  Final   Organism ID, Bacteria SERRATIA MARCESCENS (A)  Final      Susceptibility   Serratia marcescens - MIC*    CEFAZOLIN >=64 RESISTANT Resistant     CEFEPIME <=0.12 SENSITIVE Sensitive     CEFTRIAXONE <=0.25 SENSITIVE Sensitive     CIPROFLOXACIN <=0.25 SENSITIVE Sensitive     GENTAMICIN <=1 SENSITIVE Sensitive     NITROFURANTOIN 128 RESISTANT Resistant     TRIMETH/SULFA <=20 SENSITIVE Sensitive     * 80,000 COLONIES/mL SERRATIA MARCESCENS  Culture, blood (Routine X 2) w Reflex to ID Panel     Status: None (Preliminary result)   Collection Time: 11/07/22 10:02 AM   Specimen: BLOOD RIGHT HAND  Result Value Ref Range Status   Specimen Description   Final    BLOOD RIGHT HAND AEROBIC BOTTLE ONLY Performed at [facility] [address], [place], [place] [address]    Special Requests   Final    BOTTLES DRAWN AEROBIC ONLY Blood Culture adequate volume Performed at [facility] [address] [place], [place] [address]    Culture   Final    NO GROWTH 4 DAYS Performed at [facility], [place] [address], [place], [place] [address]    Report Status PENDING  Incomplete  Culture, blood (Routine X 2) w Reflex to ID Panel     Status: None (Preliminary result)   Collection Time: 11/07/22 10:03 AM   Specimen: BLOOD LEFT HAND  Result Value Ref Range Status   Specimen Description   Final    BLOOD LEFT HAND Performed at [facility] [address] [place], [place] [address]    Special Requests   Final    BOTTLES DRAWN AEROBIC ONLY Blood Culture results may not be optimal due to an inadequate volume of blood received in culture bottles Performed at [facility] [address], [place], [place] [address]    Culture   Final    NO GROWTH 4 DAYS Performed at [facility], [place] [address], [place], [place] [address]    Report Status PENDING   Incomplete    Time coordinating discharge: 35 minutes  Signed: Jahquan Klugh  [facility] 11/11/2022, 11:37 AM

## 2022-11-11 NOTE — Progress Notes (Addendum)
Pharmacy Antibiotic Note  [name] is a 76 y.o. male admitted on 11/04/2022 with Serratia bacteremia, s/p left nephrostomy tube placement 11/05/22; planning for cystoscopy 11/15/22.  ID following. Pharmacy has been consulted to transition IV cefepime to PO levaquin  Day #8 Cefepime - Tmax 99.1 - WBC 8.2 - SCr improved 2.52, CrCl 29 ml/min  Plan: Levaquin '750mg'$  PO q48h - first dose today Continue to monitor renal function and adjust as needed  Height: '5\' 10"'$  (177.8 cm) Weight: 96.8 kg (213 lb 6.5 oz) IBW/kg (Calculated) : 73  Temp (24hrs), Avg:98.2 F (36.8 C), Min:97.7 F (36.5 C), Max:99.1 F (37.3 C)  Recent Labs  Lab 11/04/22 2047 11/04/22 2250 11/05/22 0639 11/06/22 0249 11/06/22 0715 11/07/22 0306 11/08/22 0249 11/09/22 0303 11/09/22 0307 11/10/22 0823 11/11/22 0737  WBC 21.1*  --  11.8*  --  6.7 5.5 5.9  --  7.3  --  8.2  CREATININE 3.92*  --  4.17*   < > 4.38* 3.74* 3.25*  --  2.92* 2.64* 2.52*  LATICACIDVEN 7.9* 5.8* 2.7*  --   --   --   --  1.2  --   --   --    < > = values in this interval not displayed.    Estimated Creatinine Clearance: 29.1 mL/min (A) (by C-G formula based on SCr of 2.52 mg/dL (H)).    No Known Allergies  Antimicrobials this admission: Vanc 12/11 >> 12/12 Cefepime 12/11 >> 12/18 Levaquin 12/18 >>  Dose adjustments this admission:   Microbiology results: 12/12 Ucx: 80,000 Serratia Marcescens 12/12 C.diff: negative 12/12 GI panel: neg 12/11 Bcx: Serratia (no resistance) 12/12: MRSA PCr: neg 12/14 BCx: ngtd  Thank you for allowing pharmacy to be a part of this patient's care.  Peggyann Juba, PharmD, BCPS Pharmacy: [phone] 11/11/2022 8:23 AM

## 2022-11-12 LAB — CULTURE, BLOOD (ROUTINE X 2)
Culture: NO GROWTH
Culture: NO GROWTH
Special Requests: ADEQUATE

## 2022-11-12 NOTE — Patient Instructions (Signed)
DUE TO COVID-19 ONLY TWO VISITORS  (aged 76 and older)  ARE ALLOWED TO COME WITH YOU AND STAY IN THE WAITING ROOM ONLY DURING PRE OP AND PROCEDURE.   **NO VISITORS ARE ALLOWED IN THE SHORT STAY AREA OR RECOVERY ROOM!!**  IF YOU WILL BE ADMITTED INTO THE HOSPITAL YOU ARE ALLOWED ONLY FOUR SUPPORT PEOPLE DURING VISITATION HOURS ONLY (7 AM -8PM)   The support person(s) must pass our screening, gel in and out, and wear a mask at all times, including in the patient's room. Patients must also wear a mask when staff or their support person are in the room. Visitors GUEST BADGE MUST BE WORN VISIBLY  One adult visitor may remain with you overnight and MUST be in the room by 8 P.M.     Your procedure is scheduled on: 11/15/22   Report to [facility] Main Entrance    Report to admitting at : 8:15 AM   Call this number if you have problems the morning of surgery [phone]   Do not eat food :After Midnight.   After Midnight you may have the following liquids until : 7:30 AM DAY OF SURGERY  Water Black Coffee (sugar ok, NO MILK/CREAM OR CREAMERS)  Tea (sugar ok, NO MILK/CREAM OR CREAMERS) regular and decaf                             Plain Jell-O (NO RED)                                           Fruit ices (not with fruit pulp, NO RED)                                     Popsicles (NO RED)                                                                  Juice: apple, WHITE grape, WHITE cranberry Sports drinks like Gatorade (NO RED)               Oral Hygiene is also important to reduce your risk of infection.                                    Remember - BRUSH YOUR TEETH THE MORNING OF SURGERY WITH YOUR REGULAR TOOTHPASTE  DENTURES WILL BE REMOVED PRIOR TO SURGERY PLEASE DO NOT APPLY "Poly grip" OR ADHESIVES!!!   Do NOT smoke after Midnight   Take these medicines the morning of surgery with A SIP OF WATER:  gabapentin,zytiga,carvedilol,amlodipine,prednisone,Levaquin,allopurinol,oxybutrin.  DO NOT TAKE ANY ORAL DIABETIC MEDICATIONS DAY OF YOUR SURGERY  Bring CPAP mask and tubing day of surgery.                              You may not have any metal on your body including hair pins, jewelry, and body piercing  Do not wear lotions, powders, perfumes/cologne, or deodorant              Men may shave face and neck.   Do not bring valuables to the hospital. [place].   Contacts, glasses, or bridgework may not be worn into surgery.   Bring small overnight bag day of surgery.   DO NOT [place]. PHARMACY WILL DISPENSE MEDICATIONS LISTED ON YOUR MEDICATION LIST TO YOU DURING YOUR ADMISSION [place]!    Patients discharged on the day of surgery will not be allowed to drive home.  Someone NEEDS to stay with you for the first 24 hours after anesthesia.   Special Instructions: Bring a copy of your healthcare power of attorney and living will documents         the day of surgery if you haven't scanned them before.              Please read over the following fact sheets you were given: IF YOU HAVE QUESTIONS ABOUT YOUR PRE-OP INSTRUCTIONS PLEASE CALL [phone]    [facility] - Preparing for Surgery Before surgery, you can play an important role.  Because skin is not sterile, your skin needs to be as free of germs as possible.  You can reduce the number of germs on your skin by washing with CHG (chlorahexidine gluconate) soap before surgery.  CHG is an antiseptic cleaner which kills germs and bonds with the skin to continue killing germs even after washing. Please DO NOT use if you have an allergy to CHG or antibacterial soaps.  If your skin becomes reddened/irritated stop using the CHG and inform your nurse when you arrive at Short Stay. Do not shave (including legs and underarms) for at least 48  hours prior to the first CHG shower.  You may shave your face/neck. Please follow these instructions carefully:  1.  Shower with CHG Soap the night before surgery and the  morning of Surgery.  2.  If you choose to wash your hair, wash your hair first as usual with your  normal  shampoo.  3.  After you shampoo, rinse your hair and body thoroughly to remove the  shampoo.                           4.  Use CHG as you would any other liquid soap.  You can apply chg directly  to the skin and wash                       Gently with a scrungie or clean washcloth.  5.  Apply the CHG Soap to your body ONLY FROM THE NECK DOWN.   Do not use on face/ open                           Wound or open sores. Avoid contact with eyes, ears mouth and genitals (private parts).                       Wash face,  Genitals (private parts) with your normal soap.             6.  Wash thoroughly, paying special attention to the area where your surgery  will be performed.  7.  Thoroughly rinse your body with warm water from the neck down.  8.  DO NOT shower/wash with your normal soap after using and rinsing off  the CHG Soap.                9.  Pat yourself dry with a clean towel.            10.  Wear clean pajamas.            11.  Place clean sheets on your bed the night of your first shower and do not  sleep with pets. Day of Surgery : Do not apply any lotions/deodorants the morning of surgery.  Please wear clean clothes to the hospital/surgery center.  FAILURE TO FOLLOW THESE INSTRUCTIONS MAY RESULT IN THE CANCELLATION OF YOUR SURGERY PATIENT SIGNATURE_________________________________  NURSE SIGNATURE__________________________________  ________________________________________________________________________

## 2022-11-13 ENCOUNTER — Encounter (HOSPITAL_COMMUNITY)
Admit: 2022-11-13 | Discharge: 2022-11-13 | Disposition: A | Discharge status: Home / Self Care | Payer: Medicare PPO | Attending: Urology | Admitting: Urology

## 2022-11-13 ENCOUNTER — Encounter (HOSPITAL_COMMUNITY): Payer: Self-pay

## 2022-11-13 ENCOUNTER — Other Ambulatory Visit: Payer: Self-pay

## 2022-11-13 NOTE — Progress Notes (Signed)
For Short Stay: [place] appointment date:  Bowel Prep reminder:   For Anesthesia: PCP - Dr. Otelia Sergeant Cardiologist - [facility] [place].  Chest x-ray - 11/04/22 EKG - 11/06/22 Stress Test -  ECHO - 2011 Cardiac Cath - 2011 Pacemaker/ICD device last checked: Pacemaker orders received: Device Rep notified:  Spinal Cord Stimulator:  Sleep Study -  CPAP -   Fasting Blood Sugar -  Checks Blood Sugar _____ times a day Date and result of last Hgb A1c-  Last dose of GLP1 agonist-  GLP1 instructions:   Last dose of SGLT-2 inhibitors-  SGLT-2 instructions:   Blood Thinner Instructions: Aspirin Instructions: On hold since 11/11/22 Last Dose:  Activity level: Can go up a flight of stairs and activities of daily living without stopping and without chest pain and/or shortness of breath   Able to exercise without chest pain and/or shortness of breath   Unable to go up a flight of stairs without chest pain and/or shortness of breath     Anesthesia review: Hx: HTN,CKD III,CAD,MI  Patient denies shortness of breath, fever, cough and chest pain at PAT appointment   Patient verbalized understanding of instructions that were given to them at the PAT appointment. Patient was also instructed that they will need to review over the PAT instructions again at home before surgery.

## 2022-11-15 ENCOUNTER — Encounter (HOSPITAL_COMMUNITY)
Admission: RE | Disposition: A | Discharge status: Home / Self Care | Payer: Self-pay | Source: Home / Self Care | Attending: Urology

## 2022-11-15 ENCOUNTER — Ambulatory Visit (HOSPITAL_BASED_OUTPATIENT_CLINIC_OR_DEPARTMENT_OTHER): Payer: No Typology Code available for payment source | Admitting: Anesthesiology

## 2022-11-15 ENCOUNTER — Ambulatory Visit (HOSPITAL_COMMUNITY): Payer: No Typology Code available for payment source | Admitting: Physician Assistant

## 2022-11-15 ENCOUNTER — Encounter (HOSPITAL_COMMUNITY): Payer: Self-pay | Admitting: Urology

## 2022-11-15 ENCOUNTER — Ambulatory Visit (HOSPITAL_COMMUNITY)
Admission: RE | Admit: 2022-11-15 | Discharge: 2022-11-15 | Disposition: A | Discharge status: Home / Self Care | Payer: No Typology Code available for payment source | Attending: Urology | Admitting: Urology

## 2022-11-15 ENCOUNTER — Ambulatory Visit (HOSPITAL_COMMUNITY): Payer: No Typology Code available for payment source

## 2022-11-15 DIAGNOSIS — Z87440 Personal history of urinary (tract) infections: Secondary | ICD-10-CM | POA: Diagnosis not present

## 2022-11-15 DIAGNOSIS — Z8546 Personal history of malignant neoplasm of prostate: Secondary | ICD-10-CM | POA: Diagnosis not present

## 2022-11-15 DIAGNOSIS — I10 Essential (primary) hypertension: Secondary | ICD-10-CM | POA: Insufficient documentation

## 2022-11-15 DIAGNOSIS — I2510 Atherosclerotic heart disease of native coronary artery without angina pectoris: Secondary | ICD-10-CM | POA: Diagnosis not present

## 2022-11-15 DIAGNOSIS — M1990 Unspecified osteoarthritis, unspecified site: Secondary | ICD-10-CM | POA: Diagnosis not present

## 2022-11-15 DIAGNOSIS — K760 Fatty (change of) liver, not elsewhere classified: Secondary | ICD-10-CM | POA: Diagnosis not present

## 2022-11-15 DIAGNOSIS — N201 Calculus of ureter: Secondary | ICD-10-CM | POA: Diagnosis not present

## 2022-11-15 DIAGNOSIS — R509 Fever, unspecified: Secondary | ICD-10-CM | POA: Insufficient documentation

## 2022-11-15 DIAGNOSIS — N183 Chronic kidney disease, stage 3 unspecified: Secondary | ICD-10-CM | POA: Insufficient documentation

## 2022-11-15 DIAGNOSIS — Z9079 Acquired absence of other genital organ(s): Secondary | ICD-10-CM | POA: Diagnosis not present

## 2022-11-15 DIAGNOSIS — I700 Atherosclerosis of aorta: Secondary | ICD-10-CM | POA: Diagnosis not present

## 2022-11-15 DIAGNOSIS — Z923 Personal history of irradiation: Secondary | ICD-10-CM | POA: Diagnosis not present

## 2022-11-15 DIAGNOSIS — Z87891 Personal history of nicotine dependence: Secondary | ICD-10-CM

## 2022-11-15 DIAGNOSIS — Z955 Presence of coronary angioplasty implant and graft: Secondary | ICD-10-CM | POA: Diagnosis not present

## 2022-11-15 DIAGNOSIS — N132 Hydronephrosis with renal and ureteral calculous obstruction: Secondary | ICD-10-CM | POA: Insufficient documentation

## 2022-11-15 DIAGNOSIS — I252 Old myocardial infarction: Secondary | ICD-10-CM | POA: Diagnosis not present

## 2022-11-15 DIAGNOSIS — R197 Diarrhea, unspecified: Secondary | ICD-10-CM | POA: Diagnosis not present

## 2022-11-15 DIAGNOSIS — A419 Sepsis, unspecified organism: Secondary | ICD-10-CM | POA: Diagnosis not present

## 2022-11-15 DIAGNOSIS — I129 Hypertensive chronic kidney disease with stage 1 through stage 4 chronic kidney disease, or unspecified chronic kidney disease: Secondary | ICD-10-CM | POA: Diagnosis not present

## 2022-11-15 DIAGNOSIS — Z8551 Personal history of malignant neoplasm of bladder: Secondary | ICD-10-CM | POA: Insufficient documentation

## 2022-11-15 DIAGNOSIS — C775 Secondary and unspecified malignant neoplasm of intrapelvic lymph nodes: Secondary | ICD-10-CM | POA: Insufficient documentation

## 2022-11-15 HISTORY — PX: CYSTOSCOPY/URETEROSCOPY/HOLMIUM LASER/STENT PLACEMENT: SHX6546

## 2022-11-15 SURGERY — CYSTOSCOPY/URETEROSCOPY/HOLMIUM LASER/STENT PLACEMENT
Anesthesia: General | Laterality: Left

## 2022-11-15 MED ORDER — FENTANYL CITRATE PF 50 MCG/ML IJ SOSY: 25.0000 ug | PREFILLED_SYRINGE | INTRAMUSCULAR | Status: DC | PRN

## 2022-11-15 MED ORDER — SODIUM CHLORIDE 0.9% FLUSH: 3.0000 mL | INTRAVENOUS | Status: DC | PRN

## 2022-11-15 MED ORDER — MORPHINE SULFATE (PF) 2 MG/ML IV SOLN: 2.0000 mg | INTRAVENOUS | Status: DC | PRN

## 2022-11-15 MED ORDER — PHENYLEPHRINE 80 MCG/ML (10ML) SYRINGE FOR IV PUSH (FOR BLOOD PRESSURE SUPPORT)
PREFILLED_SYRINGE | INTRAVENOUS | Status: AC
  Filled 2022-11-15: qty 10

## 2022-11-15 MED ORDER — PHENYLEPHRINE HCL-NACL 20-0.9 MG/250ML-% IV SOLN
INTRAVENOUS | Status: DC | PRN
  Administered 2022-11-15: 50 ug/min via INTRAVENOUS

## 2022-11-15 MED ORDER — FENTANYL CITRATE (PF) 100 MCG/2ML IJ SOLN
INTRAMUSCULAR | Status: AC
  Filled 2022-11-15: qty 2

## 2022-11-15 MED ORDER — ACETAMINOPHEN 650 MG RE SUPP: 650.0000 mg | RECTAL | Status: DC | PRN

## 2022-11-15 MED ORDER — CHLORHEXIDINE GLUCONATE 0.12 % MT SOLN
15.0000 mL | Freq: Once | OROMUCOSAL | Status: AC
  Administered 2022-11-15: 15 mL via OROMUCOSAL

## 2022-11-15 MED ORDER — SODIUM CHLORIDE 0.9 % IV SOLN
2.0000 g | INTRAVENOUS | Status: AC
  Administered 2022-11-15: 2 g via INTRAVENOUS
  Filled 2022-11-15: qty 20

## 2022-11-15 MED ORDER — LIDOCAINE HCL (PF) 2 % IJ SOLN
INTRAMUSCULAR | Status: AC
  Filled 2022-11-15: qty 5

## 2022-11-15 MED ORDER — DEXAMETHASONE SODIUM PHOSPHATE 10 MG/ML IJ SOLN
INTRAMUSCULAR | Status: AC
  Filled 2022-11-15: qty 1

## 2022-11-15 MED ORDER — SODIUM CHLORIDE 0.9 % IR SOLN
Status: DC | PRN
  Administered 2022-11-15: 6000 mL

## 2022-11-15 MED ORDER — ONDANSETRON HCL 4 MG/2ML IJ SOLN
INTRAMUSCULAR | Status: AC
  Filled 2022-11-15: qty 2

## 2022-11-15 MED ORDER — OXYCODONE HCL 5 MG PO TABS: 5.0000 mg | ORAL_TABLET | Freq: Once | ORAL | Status: DC | PRN

## 2022-11-15 MED ORDER — LIDOCAINE 2% (20 MG/ML) 5 ML SYRINGE
INTRAMUSCULAR | Status: DC | PRN
  Administered 2022-11-15: 100 mg via INTRAVENOUS

## 2022-11-15 MED ORDER — DEXAMETHASONE SODIUM PHOSPHATE 10 MG/ML IJ SOLN
INTRAMUSCULAR | Status: DC | PRN
  Administered 2022-11-15: 10 mg via INTRAVENOUS

## 2022-11-15 MED ORDER — OXYCODONE HCL 5 MG/5ML PO SOLN: 5.0000 mg | Freq: Once | ORAL | Status: DC | PRN

## 2022-11-15 MED ORDER — PROPOFOL 10 MG/ML IV BOLUS
INTRAVENOUS | Status: DC | PRN
  Administered 2022-11-15: 40 mg via INTRAVENOUS
  Administered 2022-11-15: 160 mg via INTRAVENOUS

## 2022-11-15 MED ORDER — SODIUM CHLORIDE 0.9 % IV SOLN: 250.0000 mL | INTRAVENOUS | Status: DC | PRN

## 2022-11-15 MED ORDER — ACETAMINOPHEN 500 MG PO TABS
1000.0000 mg | ORAL_TABLET | Freq: Once | ORAL | Status: AC
  Administered 2022-11-15: 1000 mg via ORAL
  Filled 2022-11-15: qty 2

## 2022-11-15 MED ORDER — ONDANSETRON HCL 4 MG/2ML IJ SOLN
INTRAMUSCULAR | Status: DC | PRN
  Administered 2022-11-15: 4 mg via INTRAVENOUS

## 2022-11-15 MED ORDER — IOHEXOL 300 MG/ML  SOLN
INTRAMUSCULAR | Status: DC | PRN
  Administered 2022-11-15: 2 mL

## 2022-11-15 MED ORDER — PHENYLEPHRINE 80 MCG/ML (10ML) SYRINGE FOR IV PUSH (FOR BLOOD PRESSURE SUPPORT)
PREFILLED_SYRINGE | INTRAVENOUS | Status: DC | PRN
  Administered 2022-11-15 (×2): 160 ug via INTRAVENOUS

## 2022-11-15 MED ORDER — SODIUM CHLORIDE 0.9 % IV SOLN: INTRAVENOUS | Status: DC

## 2022-11-15 MED ORDER — ACETAMINOPHEN 325 MG PO TABS: 650.0000 mg | ORAL_TABLET | ORAL | Status: DC | PRN

## 2022-11-15 MED ORDER — ORAL CARE MOUTH RINSE: 15.0000 mL | Freq: Once | OROMUCOSAL | Status: AC

## 2022-11-15 MED ORDER — PROPOFOL 10 MG/ML IV BOLUS
INTRAVENOUS | Status: AC
  Filled 2022-11-15: qty 20

## 2022-11-15 MED ORDER — LEVOFLOXACIN 750 MG PO TABS: 750.0000 mg | ORAL_TABLET | ORAL | 0 refills | Status: AC

## 2022-11-15 MED ORDER — LACTATED RINGERS IV SOLN: INTRAVENOUS | Status: DC

## 2022-11-15 MED ORDER — FENTANYL CITRATE (PF) 100 MCG/2ML IJ SOLN
INTRAMUSCULAR | Status: DC | PRN
  Administered 2022-11-15: 50 ug via INTRAVENOUS

## 2022-11-15 MED ORDER — SODIUM CHLORIDE 0.9% FLUSH: 3.0000 mL | Freq: Two times a day (BID) | INTRAVENOUS | Status: DC

## 2022-11-15 MED ORDER — OXYCODONE HCL 5 MG PO TABS: 5.0000 mg | ORAL_TABLET | ORAL | Status: DC | PRN

## 2022-11-15 SURGICAL SUPPLY — 27 items
BAG URO CATCHER STRL LF (MISCELLANEOUS) ×2 IMPLANT
BASKET STONE NCOMPASS (UROLOGICAL SUPPLIES) IMPLANT
CATH URETERAL DUAL LUMEN 10F (MISCELLANEOUS) IMPLANT
CATH URETL OPEN 5X70 (CATHETERS) IMPLANT
CLOTH BEACON ORANGE TIMEOUT ST (SAFETY) ×2 IMPLANT
EXTRACTOR STONE NITINOL NGAGE (UROLOGICAL SUPPLIES) IMPLANT
GAUZE PAD ABD 8X10 STRL (GAUZE/BANDAGES/DRESSINGS) IMPLANT
GAUZE SPONGE 4X4 12PLY STRL (GAUZE/BANDAGES/DRESSINGS) IMPLANT
GLOVE SURG SS PI 8.0 STRL IVOR (GLOVE) ×2 IMPLANT
GOWN STRL REUS W/ TWL XL LVL3 (GOWN DISPOSABLE) ×2 IMPLANT
GOWN STRL REUS W/TWL XL LVL3 (GOWN DISPOSABLE) ×1
GUIDEWIRE STR DUAL SENSOR (WIRE) ×2 IMPLANT
IV NS IRRIG 3000ML ARTHROMATIC (IV SOLUTION) ×2 IMPLANT
KIT TURNOVER KIT A (KITS) IMPLANT
LASER FIB FLEXIVA PULSE ID 365 (Laser) IMPLANT
LASER FIB FLEXIVA PULSE ID 550 (Laser) IMPLANT
LASER FIB FLEXIVA PULSE ID 910 (Laser) IMPLANT
MANIFOLD NEPTUNE II (INSTRUMENTS) ×2 IMPLANT
PACK CYSTO (CUSTOM PROCEDURE TRAY) ×2 IMPLANT
SHEATH NAVIGATOR HD 11/13X36 (SHEATH) IMPLANT
SHEATH NAVIGATOR HD 12/14X46 (SHEATH) IMPLANT
STENT URET 6FRX26 CONTOUR (STENTS) IMPLANT
TAPE CLOTH SURG 6X10 WHT LF (GAUZE/BANDAGES/DRESSINGS) IMPLANT
TRACTIP FLEXIVA PULS ID 200XHI (Laser) IMPLANT
TRACTIP FLEXIVA PULSE ID 200 (Laser) ×1
TUBING CONNECTING 10 (TUBING) ×2 IMPLANT
TUBING UROLOGY SET (TUBING) ×2 IMPLANT

## 2022-11-15 NOTE — Discharge Instructions (Addendum)
You may remove the stent by pulling the attached string on Tuesday morning and if you don't feel comfortable doing that, please let me know.

## 2022-11-15 NOTE — Op Note (Signed)
Procedure: 1.  Cystoscopy with left retrograde pyelogram and interpretation. 2.  Left ureteroscopic stone extraction with homing laser application and insertion of left double-J stent. 3.  Application of fluoroscopy. 4.  Removal of nephrostomy tube.  Pre-Op diagnosis: Left UVJ stone with recent sepsis.  Postop diagnosis: Same.  Surgeon: Dr. Irine Seal.  Anesthesia: General.  Specimen: Stone fragments.  Drains: A 6 French by 26 cm left contour double-J stent with tether.  EBL: None.  Complications: None.  Indications: Patient is a 76 year old male who presented to the emergency room with sepsis from a 4 to 5 mm left distal ureteral stone.  He underwent nephrostomy drainage and is recovered from his sepsis and is now to have definitive stone management.  Procedure: He was taken operating room where general anesthetic was induced.  He was given Rocephin.  He was placed in lithotomy position and fitted with PAS hose.  His perineum and genitalia were prepped Betadine solution was draped in usual sterile fashion.  Cystoscopy was performed using a 21 [place] scope and 30 degree lens.  Examination revealed a normal urethra.  The external sphincter was somewhat patulous.  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  The bladder had mild trabeculation with no tumors, stones or other inflammation.  Ureteral orifices were unremarkable.  A 5 French open-ended catheter was then passed and a left retrograde pyelogram was performed with Omnipaque.   The left retrograde pyelogram demonstrated a filling defect approximately 2 to 3 cm proximal to the meatus consistent with a stone.  The nephrostomy tube was noted in the left upper pole and there is a questionable calcification in the lower pole as well.  I did review his CT scan and there appeared to be 2 stones in the lower pole of the kidney, 1 of which was associated with some  parenchymal scarring.  A sensor wire was advanced the kidney alongside the stone and the open-ended catheter and cystoscope were removed.  The 60 French digital access sheath inner core was then passed over the wire to dilate the distal ureter without difficulty.  The 6.5 French dual-lumen semirigid ureteroscope was then advanced alongside the wire and the stone was visualized.  The 200 m laser was then used to fragment the stone on the dusting setting and the manageable fragments which were then removed with an engage basket.  Once all visual fragments had been removed a 46 cm digital access sheath was advanced over the wire to the proximal ureter and the inner core and wire removed.  The dual-lumen digital flexible scope was then passed and the collecting system was inspected to assess the lower pole stone seen on CT.  I was unable to identify any accessible stones and one of the calyces that appeared to be associated with the larger of the stones had a pinpoint infundibular stenosis and I felt that it was not going to be of value to try to access that stone.  The other calyx with stones on CT was inspected and no visible stones were noted so it is most likely that these are papillary ductal calcifications.  The ureteroscope was removed and the sensor wire was advanced the kidney through the sheath.  The sheath was removed and the cystoscope was replaced over the wire.  A 6 French by 26 cm contour double-J stent with tether was then advanced the kidney under fluoroscopic guidance.  Care was taken to avoid  untangling the proximal loop of the stent with a nephrostomy catheter as the wire was withdrawn.  Upon removal of the wire he was noted to have a good coil in the kidney away from the nephrostomy loop and a good coil in the bladder.  The cystoscope was removed leaving the stent string exiting the urethra after the bladder was drained.  The stent string was secured to the patient's penis with  Tegaderm.  I then removed the nephrostomy tube by cutting the tethering string and removing it gently.  A fresh dressing was applied.  He was taken down from lithotomy position, his anesthetic was reversed and he was moved to recovery in stable condition.  There were no complications.  The stone fragments were given to his family.

## 2022-11-15 NOTE — Anesthesia Preprocedure Evaluation (Addendum)
Anesthesia Evaluation  Patient identified by MRN, date of birth, ID band Patient awake    Reviewed: Allergy & Precautions, NPO status , Patient's Chart, lab work & pertinent test results, reviewed documented beta blocker date and time   History of Anesthesia Complications (+) PONV and history of anesthetic complications  Airway Mallampati: II  TM Distance: >3 FB Neck ROM: Full    Dental  (+) Missing,    Pulmonary former smoker   Pulmonary exam normal        Cardiovascular hypertension, Pt. on home beta blockers and Pt. on medications + CAD, + Past MI and + Cardiac Stents (2004)  Normal cardiovascular exam     Neuro/Psych negative neurological ROS  negative psych ROS   GI/Hepatic negative GI ROS, Neg liver ROS,,,  Endo/Other  negative endocrine ROS    Renal/GU Renal InsufficiencyRenal disease     Musculoskeletal  (+) Arthritis ,    Abdominal   Peds  Hematology negative hematology ROS (+)   Anesthesia Other Findings Day of surgery medications reviewed with patient.  Reproductive/Obstetrics                              Anesthesia Physical Anesthesia Plan  ASA: 3  Anesthesia Plan: General   Post-op Pain Management: Tylenol PO (pre-op)*   Induction: Intravenous  PONV Risk Score and Plan: 3 and Treatment may vary due to age or medical condition, Dexamethasone and Ondansetron  Airway Management Planned: LMA  Additional Equipment: None  Intra-op Plan:   Post-operative Plan: Extubation in OR  Informed Consent: I have reviewed the patients History and Physical, chart, labs and discussed the procedure including the risks, benefits and alternatives for the proposed anesthesia with the patient or authorized representative who has indicated his/her understanding and acceptance.       Plan Discussed with: CRNA  Anesthesia Plan Comments:          Anesthesia Quick  Evaluation

## 2022-11-15 NOTE — Interval H&P Note (Signed)
History and Physical Interval Note:  He has recovered from the sepsis.   11/15/2022 10:30 AM  Stephannie Li  has presented today for surgery, with the diagnosis of LEFT URETERAL VESSICAL JUNCTION STONE.  The various methods of treatment have been discussed with the patient and family. After consideration of risks, benefits and other options for treatment, the patient has consented to  Procedure(s) with comments: CYSTOSCOPY LEFT URETEROSCOPY/HOLMIUM LASER/STENT EXCHANGE (Left) - 1 HR FOR CASE as a surgical intervention.  The patient's history has been reviewed, patient examined, no change in status, stable for surgery.  I have reviewed the patient's chart and labs.  Questions were answered to the patient's satisfaction.     Irine Seal

## 2022-11-15 NOTE — Anesthesia Procedure Notes (Signed)
Procedure Name: LMA Insertion Date/Time: 11/15/2022 10:55 AM  Performed by: Sharlette Dense, CRNAPatient Re-evaluated:Patient Re-evaluated prior to induction Oxygen Delivery Method: Circle system utilized Preoxygenation: Pre-oxygenation with 100% oxygen Induction Type: IV induction LMA: LMA inserted LMA Size: 4.0 Number of attempts: 1 Placement Confirmation: positive ETCO2 and breath sounds checked- equal and bilateral Tube secured with: Tape Dental Injury: Teeth and Oropharynx as per pre-operative assessment

## 2022-11-15 NOTE — Anesthesia Postprocedure Evaluation (Signed)
Anesthesia Post Note  Patient: [name]  Procedure(s) Performed: CYSTOSCOPY LEFT URETEROSCOPY/HOLMIUM LASER/STENT EXCHANGE, AND NEPHROSTOMY TUBE REMOVAL (Left)     Patient location during evaluation: PACU Anesthesia Type: General Level of consciousness: awake and alert Pain management: pain level controlled Vital Signs Assessment: post-procedure vital signs reviewed and stable Respiratory status: spontaneous breathing, nonlabored ventilation and respiratory function stable Cardiovascular status: blood pressure returned to baseline Postop Assessment: no apparent nausea or vomiting Anesthetic complications: no   No notable events documented.  Last Vitals:  Vitals:   11/15/22 1215 11/15/22 1230  BP: (!) 148/87 (!) 159/88  Pulse: 85 80  Resp: 19 18  Temp: 36.4 C 36.5 C  SpO2: 94% 96%    Last Pain:  Vitals:   11/15/22 1230  TempSrc:   PainSc: 0-No pain                 Marthenia Rolling

## 2022-11-15 NOTE — Transfer of Care (Signed)
Immediate Anesthesia Transfer of Care Note  Patient: [name]  Procedure(s) Performed: CYSTOSCOPY LEFT URETEROSCOPY/HOLMIUM LASER/STENT EXCHANGE, AND NEPHROSTOMY TUBE REMOVAL (Left)  Patient Location: PACU  Anesthesia Type:General  Level of Consciousness: awake, alert , and oriented  Airway & Oxygen Therapy: Patient Spontanous Breathing and Patient connected to face mask oxygen  Post-op Assessment: Report given to RN and Post -op Vital signs reviewed and stable  Post vital signs: Reviewed and stable  Last Vitals:  Vitals Value Taken Time  BP 133/82 11/15/22 1146  Temp    Pulse 92 11/15/22 1148  Resp 19 11/15/22 1148  SpO2 100 % 11/15/22 1148  Vitals shown include unvalidated device data.  Last Pain:  Vitals:   11/15/22 0813  TempSrc: Oral  PainSc: 0-No pain         Complications: No notable events documented.

## 2022-11-16 ENCOUNTER — Encounter (HOSPITAL_COMMUNITY): Payer: Self-pay | Admitting: Urology

## 2022-11-22 DIAGNOSIS — R8271 Bacteriuria: Secondary | ICD-10-CM | POA: Diagnosis not present

## 2022-11-22 DIAGNOSIS — N10 Acute tubulo-interstitial nephritis: Secondary | ICD-10-CM | POA: Diagnosis not present

## 2022-12-02 DIAGNOSIS — J069 Acute upper respiratory infection, unspecified: Secondary | ICD-10-CM | POA: Diagnosis not present

## 2023-07-05 DIAGNOSIS — J019 Acute sinusitis, unspecified: Secondary | ICD-10-CM | POA: Diagnosis not present

## 2023-07-05 DIAGNOSIS — R051 Acute cough: Secondary | ICD-10-CM | POA: Diagnosis not present

## 2023-07-05 DIAGNOSIS — U071 COVID-19: Secondary | ICD-10-CM | POA: Diagnosis not present

## 2023-07-05 DIAGNOSIS — R0981 Nasal congestion: Secondary | ICD-10-CM | POA: Diagnosis not present

## 2023-10-15 DIAGNOSIS — R0989 Other specified symptoms and signs involving the circulatory and respiratory systems: Secondary | ICD-10-CM | POA: Diagnosis not present

## 2023-10-15 DIAGNOSIS — J029 Acute pharyngitis, unspecified: Secondary | ICD-10-CM | POA: Diagnosis not present

## 2023-10-15 DIAGNOSIS — R051 Acute cough: Secondary | ICD-10-CM | POA: Diagnosis not present

## 2024-09-24 DIAGNOSIS — I252 Old myocardial infarction: Secondary | ICD-10-CM | POA: Diagnosis not present

## 2024-09-24 DIAGNOSIS — M109 Gout, unspecified: Secondary | ICD-10-CM | POA: Diagnosis not present

## 2024-09-24 DIAGNOSIS — I2510 Atherosclerotic heart disease of native coronary artery without angina pectoris: Secondary | ICD-10-CM | POA: Diagnosis not present

## 2024-09-24 DIAGNOSIS — R32 Unspecified urinary incontinence: Secondary | ICD-10-CM | POA: Diagnosis not present

## 2024-09-24 DIAGNOSIS — I700 Atherosclerosis of aorta: Secondary | ICD-10-CM | POA: Diagnosis not present

## 2024-09-24 DIAGNOSIS — C61 Malignant neoplasm of prostate: Secondary | ICD-10-CM | POA: Diagnosis not present

## 2024-09-24 DIAGNOSIS — Z7982 Long term (current) use of aspirin: Secondary | ICD-10-CM | POA: Diagnosis not present

## 2024-09-24 DIAGNOSIS — N189 Chronic kidney disease, unspecified: Secondary | ICD-10-CM | POA: Diagnosis not present

## 2024-09-24 DIAGNOSIS — L309 Dermatitis, unspecified: Secondary | ICD-10-CM | POA: Diagnosis not present
